# Patient Record
Sex: Female | Born: 1935 | Race: White | Hispanic: No | Marital: Single | Smoking: Never smoker
Health system: Southern US, Community
[De-identification: ages and names within clinical notes are randomized; demographics above are authoritative.]

## PROBLEM LIST (undated history)

## (undated) DIAGNOSIS — G629 Polyneuropathy, unspecified: Secondary | ICD-10-CM

## (undated) DIAGNOSIS — S27322A Contusion of lung, bilateral, initial encounter: Secondary | ICD-10-CM

## (undated) DIAGNOSIS — E119 Type 2 diabetes mellitus without complications: Secondary | ICD-10-CM

## (undated) DIAGNOSIS — M1990 Unspecified osteoarthritis, unspecified site: Secondary | ICD-10-CM

## (undated) DIAGNOSIS — N170 Acute kidney failure with tubular necrosis: Secondary | ICD-10-CM

## (undated) DIAGNOSIS — I2510 Atherosclerotic heart disease of native coronary artery without angina pectoris: Secondary | ICD-10-CM

## (undated) DIAGNOSIS — S271XXD Traumatic hemothorax, subsequent encounter: Secondary | ICD-10-CM

## (undated) DIAGNOSIS — J9621 Acute and chronic respiratory failure with hypoxia: Secondary | ICD-10-CM

## (undated) DIAGNOSIS — K5730 Diverticulosis of large intestine without perforation or abscess without bleeding: Secondary | ICD-10-CM

## (undated) DIAGNOSIS — K529 Noninfective gastroenteritis and colitis, unspecified: Secondary | ICD-10-CM

## (undated) DIAGNOSIS — I10 Essential (primary) hypertension: Secondary | ICD-10-CM

## (undated) HISTORY — PX: AORTIC VALVE REPLACEMENT: SHX41

## (undated) HISTORY — PX: APPENDECTOMY: SHX54

## (undated) HISTORY — PX: CHOLECYSTECTOMY: SHX55

## (undated) HISTORY — PX: FOOT NEUROMA SURGERY: SHX646

## (undated) HISTORY — PX: PACEMAKER INSERTION: SHX728

## (undated) HISTORY — PX: ABDOMINAL HYSTERECTOMY: SHX81

## (undated) HISTORY — PX: KNEE ARTHROSCOPY: SUR90

---

## 2009-11-26 ENCOUNTER — Ambulatory Visit: Payer: Self-pay | Admitting: Thoracic Surgery (Cardiothoracic Vascular Surgery)

## 2010-01-14 ENCOUNTER — Ambulatory Visit: Payer: Self-pay | Admitting: Thoracic Surgery (Cardiothoracic Vascular Surgery)

## 2010-03-05 ENCOUNTER — Ambulatory Visit: Payer: Self-pay | Admitting: Thoracic Surgery (Cardiothoracic Vascular Surgery)

## 2010-11-13 NOTE — Assessment & Plan Note (Signed)
[facility] VISIT   Lyons, Heather  DOB:  09/06/1934                                        January 16, 2010  CHART #:  [id number]   HISTORY:  We saw Ms. Heather Lyons in the office on Wednesday, January 14, 2010 following up for her aortic valve replacement that was carried out  on December 02, 2009.  Ms. Heather is doing quite well following surgery.  Her  sternum is stable.  Her wounds are well healed.  She is feeling much  better with much less shortness of breath.  She has followed up with the  cardiologist and they are happy with her progress.  She is participating  in the [facility].  Sternal wound precautions were discussed  with the patient and she was advised to follow up with [place] again on a  p.r.n. basis.   Tera Mater. Arvilla Market, MD   BC/MEDQ  D:  01/16/2010  T:  01/16/2010  Job:  [id number]

## 2016-04-29 ENCOUNTER — Emergency Department (HOSPITAL_COMMUNITY): Payer: Medicare Other

## 2016-04-29 ENCOUNTER — Encounter (HOSPITAL_COMMUNITY): Payer: Self-pay

## 2016-04-29 ENCOUNTER — Inpatient Hospital Stay (HOSPITAL_COMMUNITY)
Admission: EM | Admit: 2016-04-29 | Discharge: 2016-05-01 | DRG: 083 | Disposition: A | Discharge status: Home / Self Care | Payer: Medicare Other | Attending: Family Medicine | Admitting: Family Medicine

## 2016-04-29 DIAGNOSIS — E860 Dehydration: Secondary | ICD-10-CM

## 2016-04-29 DIAGNOSIS — N182 Chronic kidney disease, stage 2 (mild): Secondary | ICD-10-CM | POA: Diagnosis present

## 2016-04-29 DIAGNOSIS — W1830XA Fall on same level, unspecified, initial encounter: Secondary | ICD-10-CM | POA: Diagnosis present

## 2016-04-29 DIAGNOSIS — N179 Acute kidney failure, unspecified: Secondary | ICD-10-CM

## 2016-04-29 DIAGNOSIS — I440 Atrioventricular block, first degree: Secondary | ICD-10-CM | POA: Diagnosis present

## 2016-04-29 DIAGNOSIS — I4510 Unspecified right bundle-branch block: Secondary | ICD-10-CM

## 2016-04-29 DIAGNOSIS — G450 Vertebro-basilar artery syndrome: Secondary | ICD-10-CM | POA: Diagnosis present

## 2016-04-29 DIAGNOSIS — Z8673 Personal history of transient ischemic attack (TIA), and cerebral infarction without residual deficits: Secondary | ICD-10-CM

## 2016-04-29 DIAGNOSIS — E8770 Fluid overload, unspecified: Secondary | ICD-10-CM | POA: Diagnosis present

## 2016-04-29 DIAGNOSIS — N189 Chronic kidney disease, unspecified: Secondary | ICD-10-CM

## 2016-04-29 DIAGNOSIS — E861 Hypovolemia: Secondary | ICD-10-CM | POA: Diagnosis present

## 2016-04-29 DIAGNOSIS — I10 Essential (primary) hypertension: Secondary | ICD-10-CM | POA: Diagnosis present

## 2016-04-29 DIAGNOSIS — I129 Hypertensive chronic kidney disease with stage 1 through stage 4 chronic kidney disease, or unspecified chronic kidney disease: Secondary | ICD-10-CM | POA: Diagnosis present

## 2016-04-29 DIAGNOSIS — E1122 Type 2 diabetes mellitus with diabetic chronic kidney disease: Secondary | ICD-10-CM | POA: Diagnosis present

## 2016-04-29 DIAGNOSIS — Z90710 Acquired absence of both cervix and uterus: Secondary | ICD-10-CM

## 2016-04-29 DIAGNOSIS — I951 Orthostatic hypotension: Secondary | ICD-10-CM | POA: Diagnosis present

## 2016-04-29 DIAGNOSIS — Y92481 Parking lot as the place of occurrence of the external cause: Secondary | ICD-10-CM

## 2016-04-29 DIAGNOSIS — K7469 Other cirrhosis of liver: Secondary | ICD-10-CM | POA: Diagnosis present

## 2016-04-29 DIAGNOSIS — E1151 Type 2 diabetes mellitus with diabetic peripheral angiopathy without gangrene: Secondary | ICD-10-CM | POA: Diagnosis present

## 2016-04-29 DIAGNOSIS — Z952 Presence of prosthetic heart valve: Secondary | ICD-10-CM

## 2016-04-29 DIAGNOSIS — E785 Hyperlipidemia, unspecified: Secondary | ICD-10-CM | POA: Diagnosis present

## 2016-04-29 DIAGNOSIS — R55 Syncope and collapse: Secondary | ICD-10-CM

## 2016-04-29 DIAGNOSIS — Z888 Allergy status to other drugs, medicaments and biological substances status: Secondary | ICD-10-CM

## 2016-04-29 DIAGNOSIS — R40241 Glasgow coma scale score 13-15, unspecified time: Secondary | ICD-10-CM | POA: Diagnosis present

## 2016-04-29 DIAGNOSIS — S065X9A Traumatic subdural hemorrhage with loss of consciousness of unspecified duration, initial encounter: Principal | ICD-10-CM | POA: Diagnosis present

## 2016-04-29 DIAGNOSIS — E1140 Type 2 diabetes mellitus with diabetic neuropathy, unspecified: Secondary | ICD-10-CM | POA: Diagnosis present

## 2016-04-29 DIAGNOSIS — S0181XA Laceration without foreign body of other part of head, initial encounter: Secondary | ICD-10-CM | POA: Diagnosis present

## 2016-04-29 DIAGNOSIS — S065XAA Traumatic subdural hemorrhage with loss of consciousness status unknown, initial encounter: Secondary | ICD-10-CM | POA: Diagnosis present

## 2016-04-29 DIAGNOSIS — I453 Trifascicular block: Secondary | ICD-10-CM | POA: Diagnosis present

## 2016-04-29 DIAGNOSIS — R001 Bradycardia, unspecified: Secondary | ICD-10-CM

## 2016-04-29 DIAGNOSIS — I517 Cardiomegaly: Secondary | ICD-10-CM | POA: Diagnosis present

## 2016-04-29 DIAGNOSIS — M1990 Unspecified osteoarthritis, unspecified site: Secondary | ICD-10-CM | POA: Diagnosis present

## 2016-04-29 DIAGNOSIS — Z87891 Personal history of nicotine dependence: Secondary | ICD-10-CM

## 2016-04-29 DIAGNOSIS — E1165 Type 2 diabetes mellitus with hyperglycemia: Secondary | ICD-10-CM | POA: Diagnosis present

## 2016-04-29 DIAGNOSIS — K529 Noninfective gastroenteritis and colitis, unspecified: Secondary | ICD-10-CM | POA: Diagnosis present

## 2016-04-29 HISTORY — DX: Noninfective gastroenteritis and colitis, unspecified: K52.9

## 2016-04-29 HISTORY — DX: Polyneuropathy, unspecified: G62.9

## 2016-04-29 HISTORY — DX: Type 2 diabetes mellitus without complications: E11.9

## 2016-04-29 HISTORY — DX: Essential (primary) hypertension: I10

## 2016-04-29 HISTORY — DX: Unspecified osteoarthritis, unspecified site: M19.90

## 2016-04-29 HISTORY — DX: Diverticulosis of large intestine without perforation or abscess without bleeding: K57.30

## 2016-04-29 LAB — I-STAT CHEM 8, ED
BUN: 17 mg/dL (ref 6–20)
CREATININE: 1.1 mg/dL — AB (ref 0.44–1.00)
Calcium, Ion: 1.13 mmol/L — ABNORMAL LOW (ref 1.15–1.40)
Chloride: 99 mmol/L — ABNORMAL LOW (ref 101–111)
GLUCOSE: 158 mg/dL — AB (ref 65–99)
HEMATOCRIT: 36 % (ref 36.0–46.0)
HEMOGLOBIN: 12.2 g/dL (ref 12.0–15.0)
Potassium: 4.5 mmol/L (ref 3.5–5.1)
Sodium: 137 mmol/L (ref 135–145)
TCO2: 26 mmol/L (ref 0–100)

## 2016-04-29 LAB — CBC WITH DIFFERENTIAL/PLATELET
BASOS PCT: 0 %
Basophils Absolute: 0 10*3/uL (ref 0.0–0.1)
EOS PCT: 2 %
Eosinophils Absolute: 0.1 10*3/uL (ref 0.0–0.7)
HEMATOCRIT: 36.7 % (ref 36.0–46.0)
Hemoglobin: 11.6 g/dL — ABNORMAL LOW (ref 12.0–15.0)
Lymphocytes Relative: 10 %
Lymphs Abs: 0.5 10*3/uL — ABNORMAL LOW (ref 0.7–4.0)
MCH: 26.4 pg (ref 26.0–34.0)
MCHC: 31.6 g/dL (ref 30.0–36.0)
MCV: 83.6 fL (ref 78.0–100.0)
MONO ABS: 0.4 10*3/uL (ref 0.1–1.0)
MONOS PCT: 8 %
NEUTROS ABS: 4 10*3/uL (ref 1.7–7.7)
Neutrophils Relative %: 80 %
PLATELETS: 177 10*3/uL (ref 150–400)
RBC: 4.39 MIL/uL (ref 3.87–5.11)
RDW: 14 % (ref 11.5–15.5)
WBC: 5 10*3/uL (ref 4.0–10.5)

## 2016-04-29 LAB — BRAIN NATRIURETIC PEPTIDE: B NATRIURETIC PEPTIDE 5: 84.2 pg/mL (ref 0.0–100.0)

## 2016-04-29 LAB — GLUCOSE, CAPILLARY: Glucose-Capillary: 159 mg/dL — ABNORMAL HIGH (ref 65–99)

## 2016-04-29 LAB — COMPREHENSIVE METABOLIC PANEL
ALK PHOS: 44 U/L (ref 38–126)
ALT: 42 U/L (ref 14–54)
ANION GAP: 8 (ref 5–15)
AST: 63 U/L — ABNORMAL HIGH (ref 15–41)
Albumin: 3.3 g/dL — ABNORMAL LOW (ref 3.5–5.0)
BUN: 15 mg/dL (ref 6–20)
CALCIUM: 9 mg/dL (ref 8.9–10.3)
CO2: 27 mmol/L (ref 22–32)
CREATININE: 1.05 mg/dL — AB (ref 0.44–1.00)
Chloride: 101 mmol/L (ref 101–111)
GFR, EST AFRICAN AMERICAN: 57 mL/min — AB (ref >60)
GFR, EST NON AFRICAN AMERICAN: 49 mL/min — AB (ref >60)
Glucose, Bld: 167 mg/dL — ABNORMAL HIGH (ref 65–99)
Potassium: 4.5 mmol/L (ref 3.5–5.1)
Sodium: 136 mmol/L (ref 135–145)
TOTAL PROTEIN: 6.5 g/dL (ref 6.5–8.1)
Total Bilirubin: 0.6 mg/dL (ref 0.3–1.2)

## 2016-04-29 LAB — I-STAT TROPONIN, ED: Troponin i, poc: 0 ng/mL (ref 0.00–0.08)

## 2016-04-29 LAB — TROPONIN I

## 2016-04-29 MED ORDER — INSULIN ASPART 100 UNIT/ML ~~LOC~~ SOLN: 0.0000 [IU] | Freq: Every day | SUBCUTANEOUS | Status: DC

## 2016-04-29 MED ORDER — DOXEPIN HCL 25 MG PO CAPS: 25.0000 mg | ORAL_CAPSULE | Freq: Two times a day (BID) | ORAL | Status: DC

## 2016-04-29 MED ORDER — GABAPENTIN 400 MG PO CAPS: 1200.0000 mg | ORAL_CAPSULE | Freq: Every day | ORAL | Status: DC

## 2016-04-29 MED ORDER — SODIUM CHLORIDE 0.9 % IV BOLUS (SEPSIS)
1000.0000 mL | Freq: Once | INTRAVENOUS | Status: AC
  Administered 2016-04-29: 1000 mL via INTRAVENOUS

## 2016-04-29 MED ORDER — NAPROXEN SODIUM 220 MG PO TABS: 220.0000 mg | ORAL_TABLET | Freq: Every day | ORAL | Status: DC

## 2016-04-29 MED ORDER — FENOFIBRATE 160 MG PO TABS: 160.0000 mg | ORAL_TABLET | Freq: Every day | ORAL | Status: DC

## 2016-04-29 MED ORDER — INSULIN ASPART 100 UNIT/ML ~~LOC~~ SOLN
0.0000 [IU] | Freq: Three times a day (TID) | SUBCUTANEOUS | Status: DC
  Administered 2016-04-30 – 2016-05-01 (×3): 1 [IU] via SUBCUTANEOUS
  Administered 2016-05-01: 2 [IU] via SUBCUTANEOUS

## 2016-04-29 MED ORDER — AMITRIPTYLINE HCL 25 MG PO TABS: 25.0000 mg | ORAL_TABLET | Freq: Every day | ORAL | Status: DC

## 2016-04-29 MED ORDER — NAPROXEN 250 MG PO TABS
250.0000 mg | ORAL_TABLET | Freq: Every day | ORAL | Status: DC
  Administered 2016-04-30 – 2016-05-01 (×3): 250 mg via ORAL
  Filled 2016-04-29 (×3): qty 1

## 2016-04-29 NOTE — Progress Notes (Signed)
RN spoke to adm MD, Doroteo GlassmanPhelps. Cardiac monitoring and complete order sets will be entered by MD. RN will continue to monitor patient.

## 2016-04-29 NOTE — ED Notes (Signed)
Pt presents following syncopal event today while shopping. Pt. Was bradycardic at 32 BPM on EMS arrival. Pt. AxO x4 in ED, pt. HR 72 NSR at this time. Pt. States felt fine this morning prior to event. Pt. Has small parietal R sided head laceration with bleeding controlled, no sutures required. Pt. On plavix. CT scan shows small 3mm R sided subdural hematoma. Pt. Able to ambulate to bathroom with one person assist.

## 2016-04-29 NOTE — H&P (Signed)
Family Medicine Teaching [facility] Admission History and Physical Service Pager: [phone]  Patient name: Heather Lyons Medical record number: [id number] Date of birth: 06/07/1935 Age: 81 y.o. Gender: female  Primary Care Provider: Vernona Rieger, MD Consultants: None Code Status: Full  Chief Complaint: Loss of consciousness  Assessment and Plan: Heather Lyons is a 81 y.o. female presenting with syncope. PMH is significant for HTN, DM2 with neuorpathy, colitis, diverticulosis, CKD2, anxiety, memory loss, HLD, Vertebrobasilar insufficiency, S/p aortic valve replacement, and arthritis.  #Syncope. Patient experienced loss of consciousness. Most likely secondary to orthostatic hypotension. Orthostatic vital signs positive in ED. Per EMS patient was bradycardic and mildly hypotensive. Other cause of syncopal episode could be vasovagal. History of syncopal episodes after triggers such as vomiting, diarrhea. Cannot rule out cardiovascular cause of syncope. EKG in ED showed RBBB(Known RBBB follows with [place] cards) and LAFB, PVCs.Compared to care everywhere EKGs unchanged. Trop neg. Some bradycardia per EMS but normal rate in ED. Rule out hypoglycemia, hypoxia, anemia as causes of syncope due to normal values. Exertional component to syncope with lack of typical prodrome and resultant trauma, cardiac cause high on differential. S/p aortic valve replacement. From chart review has known vertebrobasilar insufficiency for which she follows with neuro. The some symptoms of vertebrobasilar ischemia are dizziness, vertigo, drop attacks, etc.  -Monitor on telemetry -Obtain echo to rule out structural heart disease -Repeat EKG in a.m. -Not fluid resuscitated in ED with orthostatic hypotension. Will give bolus  -repeat labs in AM  #Subdural Hematoma. From traumatic fall from syncopal episode. Two areas appreciated: thin right parafalcine subdural hematoma measuring 3mm in maximum thickness  anteriorly. Additional tiny right lateral convexity subdural hematoma measuring 2mm in maximum thickness. Will manage nonoperatively at this time since <44mm. Neuro exam intact. Not on anticoagulation. Hemodynamically stable. GCS 15.  -repeat head CT in 7 hrs or sooner if exhibiting changes in exam -hold all antiplatelets and anticoagulation -neuro checks q4hrs -curbside called neurology who agreed with above plan; may need to formally consult in AM depending on repeat CT  #Cardiomegaly and vascular congestion noted on CXR. Patient with symptoms of fluid overload. Euvolemic to hypovolemic on exam. No known history of CHF. BNP was wnl.  Echo in AM monitor  #HTN. Hypotension noted on admission. Diastolic blood pressures soft. Bolus as above Hold off on antihypertensive medications Monitor  CKD stage 2. Cr baseline .8. On admission 1.05. Likely prerenal in setting of dehydration and orthostatic hypotension -recheck Cr in AM -bolus as above -avoid nephrotoxic medications    #T2DM with neuropathy. Last A1c 8.7 11/2015. Only on glipizide at home. Continue Neurontin for neuropathy Will obtain A1C Sensitive SSI monitor CBGs  #HLD. Last lipid panel in 11/2015 showed total cholesterol 131 and LDL 76.  On fenofibrate at home. Will continue Holding aspirin and Plavix in setting of bleed refuses to use statins per chart review  #Cryptogenic cirrhosis. Likely fatty liver nash seeing gi. Elevated AST to 63.  #History of lymphocytic colitis. Recent flare 2 weeks ago.  Continues on doxepin from GI  FEN/GI: heart diet Prophylaxis: SCDs  Disposition: Admit to FPTS. Pending further work-up of syncopal episode.   History of Present Illness:  Heather Lyons is a 81 y.o. female presenting with syncopal episode. PMH significant for HTN, DM2 with neuorpathy, colitis, diverticulosis, and arthritis. Patient states that she was walking from her car [place] [employer] when she got dizzy and passed out. Next thing  she knew she woke up to EMS taking her  blood pressure. EMS found patient bradycardic in 30's with systolic in low 100's low. Patient denies any prodromal symptoms including vision changes, da=horisis, or chest pain. States that for the last 2.5 weeks she had been having non-bloody diarrhea due to a colitis flare. During these episodes she had poor PO intake. That since resolved on Friday of last week. While she was having diarrhea she also felt dizzy but had no passing out spills. Has had previous episodes of syncope with vomiting and diarrhea. States she followed with neurology who told her she had damaged vessels in her neck that were not functioning that caused her spells. Was started on Plavix and states symptoms improved since then.   Complaining of pain only on the back of her head from where she fell.   In ED patient was noted to have SDH on CT head. Orthostatic vitals also positive.   Review Of Systems: Per HPI  Review of Systems  Constitutional: Negative for chills, diaphoresis and fever.  Eyes: Negative for blurred vision.  Respiratory: Negative for shortness of breath.   Cardiovascular: Negative for chest pain.  Gastrointestinal: Negative for abdominal pain, diarrhea, nausea and vomiting.  Neurological: Positive for dizziness and loss of consciousness. Negative for focal weakness and headaches.    Patient Active Problem List   Diagnosis Date Noted  . SDH (subdural hematoma) (HCC) 04/29/2016    Past Medical History: Past Medical History:  Diagnosis Date  . Arthritis   . Colitis   . Diabetes mellitus without complication (HCC)   . Diverticula of colon   . Hypertension   . Neuropathy [facility](HCC)     Past Surgical History: Past Surgical History:  Procedure Laterality Date  . ABDOMINAL HYSTERECTOMY    . AORTIC VALVE REPLACEMENT    . APPENDECTOMY    . CHOLECYSTECTOMY    . FOOT NEUROMA SURGERY    . KNEE ARTHROSCOPY      Social History: Social History  Substance Use Topics   . Smoking status: Former Games developermoker  . Smokeless tobacco: Never Used  . Alcohol use No   Additional social history: lives alone Please also refer to relevant sections of EMR.  Family History: Family History  Problem Relation Age of Onset  . Colon cancer Mother   . Hodgkin's lymphoma Mother   . Heart disease Father   . AAA (abdominal aortic aneurysm) Father   . Aneurysm Father   . Heart disease Sister   . Colon cancer Sister      Allergies and Medications: Allergies  Allergen Reactions  . Cold Medicine Plus [Chlorphen-Pseudoephed-Apap]     Comtrex   No current facility-administered medications on file prior to encounter.    No current outpatient prescriptions on file prior to encounter.    Objective: BP 129/66   Pulse 64   Resp 18   Ht 5\' 8"  (1.727 m)   Wt 225 lb (102.1 kg)   SpO2 98%   BMI 34.21 kg/m  Exam: General: alert, well-developed, NAD, cooperative, obese HEENT: [place], traumatic laceration of right side of occiput. Vision grossly intact, PERRLA, no injection and anicteric. EOMI. Tachy mucous membranes, oral mucosa and oropharynx reveal no lesions or exudates. Dentures in place.  Ears: External ear exam reveals no significant lesions or deformities.  Nose: External nasal exam reveals no deformity or inflammation. Nasal mucosa are pink and moist. No lesions or exudates noted.  Neck: supple, full ROM, no thyromegaly. No deformities, masses, or tenderness noted.  Lungs: CTAB, normal respiratory effort, no crackles,  and no wheezes.  Heart: RRR, murmur appreciated Abdomen: Bowel sounds normal; abdomen soft and nontender. No distension. no guarding, no rebound tenderness Msk: no joint swelling, no joint warmth, and no redness over joints. Normal tone Pulses: DP/PT are full and equal bilaterally.  Extremities: No cyanosis, clubbing, trace edema Neurologic: No focal deficits, CN grossly intact,+5 strength globally, sensation diminished in bilateral lower extremities  otherwise normal, A&Ox3.  Skin: Abrasions noted on bilateral elbows. Bruising on arms and left lower abdomen.  Psych: Mood and affect are normal; no evidence of anxiety or depression.   Labs and Imaging: Results for orders placed or performed during the hospital encounter of 04/29/16 (from the past 24 hour(s))  CBC with Differential     Status: Abnormal   Collection Time: 04/29/16  5:03 PM  Result Value Ref Range   WBC 5.0 4.0 - 10.5 K/uL   RBC 4.39 3.87 - 5.11 MIL/uL   Hemoglobin 11.6 (L) 12.0 - 15.0 g/dL   HCT 16.136.7 09.636.0 - 04.546.0 %   MCV 83.6 78.0 - 100.0 fL   MCH 26.4 26.0 - 34.0 pg   MCHC 31.6 30.0 - 36.0 g/dL   RDW 40.914.0 81.111.5 - 91.415.5 %   Platelets 177 150 - 400 K/uL   Neutrophils Relative % 80 %   Neutro Abs 4.0 1.7 - 7.7 K/uL   Lymphocytes Relative 10 %   Lymphs Abs 0.5 (L) 0.7 - 4.0 K/uL   Monocytes Relative 8 %   Monocytes Absolute 0.4 0.1 - 1.0 K/uL   Eosinophils Relative 2 %   Eosinophils Absolute 0.1 0.0 - 0.7 K/uL   Basophils Relative 0 %   Basophils Absolute 0.0 0.0 - 0.1 K/uL  Comprehensive metabolic panel     Status: Abnormal   Collection Time: 04/29/16  5:03 PM  Result Value Ref Range   Sodium 136 135 - 145 mmol/L   Potassium 4.5 3.5 - 5.1 mmol/L   Chloride 101 101 - 111 mmol/L   CO2 27 22 - 32 mmol/L   Glucose, Bld 167 (H) 65 - 99 mg/dL   BUN 15 6 - 20 mg/dL   Creatinine, Ser 7.821.05 (H) 0.44 - 1.00 mg/dL   Calcium 9.0 8.9 - 95.610.3 mg/dL   Total Protein 6.5 6.5 - 8.1 g/dL   Albumin 3.3 (L) 3.5 - 5.0 g/dL   AST 63 (H) 15 - 41 U/L   ALT 42 14 - 54 U/L   Alkaline Phosphatase 44 38 - 126 U/L   Total Bilirubin 0.6 0.3 - 1.2 mg/dL   GFR calc non Af Amer 49 (L) >60 mL/min   GFR calc Af Amer 57 (L) >60 mL/min   Anion gap 8 5 - 15  I-stat troponin, ED     Status: None   Collection Time: 04/29/16  5:04 PM  Result Value Ref Range   Troponin i, poc 0.00 0.00 - 0.08 ng/mL   Comment 3          I-stat Chem 8, ED     Status: Abnormal   Collection Time: 04/29/16  5:06 PM   Result Value Ref Range   Sodium 137 135 - 145 mmol/L   Potassium 4.5 3.5 - 5.1 mmol/L   Chloride 99 (L) 101 - 111 mmol/L   BUN 17 6 - 20 mg/dL   Creatinine, Ser 2.131.10 (H) 0.44 - 1.00 mg/dL   Glucose, Bld 086158 (H) 65 - 99 mg/dL   Calcium, Ion 5.781.13 (L) 1.15 - 1.40 mmol/L  TCO2 26 0 - 100 mmol/L   Hemoglobin 12.2 12.0 - 15.0 g/dL   HCT 16.1 09.6 - 04.5 %   Dg Chest 2 View  Result Date: 04/29/2016 CLINICAL DATA:  Dizziness EXAM: CHEST  2 VIEW COMPARISON:  None. FINDINGS: The heart is moderately enlarged. Postop changes from sternotomy and aortic valve replacement are noted. Vascular congestion. Lungs are under aerated. No pneumothorax or pleural effusion. No Kerley B lines to suggest interstitial edema. Central pulmonary vessels are prominent likely due to volume overload. IMPRESSION: Cardiomegaly and vascular congestion compatible with mild volume overload. Electronically Signed   By: Jolaine Click M.D.   On: 04/29/2016 16:29   Ct Head Wo Contrast  Result Date: 04/29/2016 CLINICAL DATA:  Fall, Plavix after syncope EXAM: CT HEAD WITHOUT CONTRAST TECHNIQUE: Contiguous axial images were obtained from the base of the skull through the vertex without intravenous contrast. COMPARISON:  None. FINDINGS: Brain: There is no acute territorial infarction identified. There is a thin right parafalcine subdural hematoma, this measures 3 mm in maximum thickness anteriorly. There is asymmetric thickening and increased density along the right tentorium, also suspected to represent small subdural hematoma. Additional tiny right lateral convexity subdural hematoma measuring 2 mm in maximum thickness, series 203, image number 21. No midline shift. No focal masses. Mild atrophy. Mild periventricular white matter hypodensity consistent with small vessel disease. Old appearing lacunar infarct in the right basal ganglia. Vascular: No hyperdense vessels. There are dense calcifications within the carotid arteries. Skull: The  mastoid air cells are clear. There is no depressed skull fracture. Sinuses/Orbits: Mild mucosal thickening in the ethmoid sinuses. No acute orbital abnormality. Other: Large right parietal scalp hematoma. IMPRESSION: 1. Thin right parafalcine subdural hematoma measuring 3 mm in maximum thickness. No midline shift. Additional right lateral convexity 2 mm subdural hematoma. Small amount of right subdural blood along the tentorium. 2. Atrophy and mild periventricular white matter small vessel disease. 3. Large right parietal scalp hematoma without underlying skull fracture. Critical Value/emergent results were called by telephone at the time of interpretation on 04/29/2016 at 4:55 pm to Dr. Jeri Modena GADDY , who verbally acknowledged these results. Electronically Signed   By: Jasmine Pang M.D.   On: 04/29/2016 16:55    Pincus Large, DO 04/29/2016, 6:18 PM PGY-3, [facility] FPTS Intern pager: [phone], text pages welcome

## 2016-04-29 NOTE — Progress Notes (Signed)
Med rec inadequate and unable to reach patient's pharmacy. Will hold all home medications tonight until we can get a better understanding of her medications. None seem emergent in nature and patient already took some of her medications this morning. Spoke with pharmacy and nursing about this and they are agreeable. Of note, patient showed me a paper from prior office visit from an AVS that she kept in her wallet of her medications. Unsure if pharmacy was aware of this when she was med rec'd.   Caryl AdaJazma Almyra Birman, DO 04/29/2016, 10:14 PM PGY-3, [facility]

## 2016-04-29 NOTE — ED Triage Notes (Signed)
Pt BIB [facility] for evaluation of syncopal episode today. Pt. Was ambulating [employer] this afternoon when she felt dizzy and had a syncopal episode. Pt. Is on plavix, has hematoma to posterior head. EMS notes pt bradycardic 32 BPM on arrival. Pt states has had intermittent nausea/diarrhea x 2 weeks, states felt fine this AM. Pt. AxO x4 on assessment, HR 76.

## 2016-04-29 NOTE — Progress Notes (Signed)
NT did not place cardiac monitor on patient as delegated by RN. RN place patient on cardiac monitor and contacted CCMD at 2100. RN requested NT to verify cardiac monitor with CCMD. RN will confirm verification.

## 2016-04-29 NOTE — Progress Notes (Signed)
RN spoke with in patient pharmacist, who states to hold medications until current medication list can be confirmed by patients pharmacy ([facility]). RN notified MD and will continue to monitor patient.

## 2016-04-29 NOTE — ED Notes (Signed)
Pt tolerated ambulation to restroom with this RN well. Pt. Denies dizziness.

## 2016-04-29 NOTE — ED Provider Notes (Signed)
[facility]-EMERGENCY DEPT Provider Note   CSN: [id number] Arrival date & time: 04/29/16  1531  History   Chief Complaint Chief Complaint  Patient presents with  . Loss of Consciousness   HPI Heather Evenerdith Jeansonne is a 81 y.o. female.   Loss of Consciousness   This is a new problem. The current episode started less than 1 hour ago. The problem occurs constantly. The problem has not changed since onset.She lost consciousness for a period of less than one minute. The problem is associated with exertion and sitting up. Associated symptoms include dizziness and light-headedness. Pertinent negatives include abdominal pain, bowel incontinence, chest pain, confusion, fever, focal weakness, nausea, palpitations, seizures, slurred speech, visual change and vomiting. Her past medical history is significant for DM. Her past medical history does not include CAD.    Past Medical History:  Diagnosis Date  . Arthritis   . Diabetes mellitus without complication (HCC)   . Hypertension   . Neuropathy (HCC)    There are no active problems to display for this patient.  Past Surgical History:  Procedure Laterality Date  . ABDOMINAL HYSTERECTOMY    . APPENDECTOMY    . CHOLECYSTECTOMY      OB History    No data available     Home Medications    Prior to Admission medications   Not on File    Family History No family history on file.  Social History Social History  Substance Use Topics  . Smoking status: Former Games developermoker  . Smokeless tobacco: Never Used  . Alcohol use No     Allergies   Cold medicine plus [chlorphen-pseudoephed-apap]   Review of Systems Review of Systems  Constitutional: Negative for fever.  Cardiovascular: Positive for syncope. Negative for chest pain and palpitations.  Gastrointestinal: Negative for abdominal pain, bowel incontinence, nausea and vomiting.  Neurological: Positive for dizziness and light-headedness. Negative for focal weakness and seizures.    Psychiatric/Behavioral: Negative for confusion.  All other systems reviewed and are negative.  Physical Exam Updated Vital Signs BP 124/63   Pulse 74   Resp 17   Ht 5\' 8"  (1.727 m)   Wt 102.1 kg   SpO2 96%   BMI 34.21 kg/m   Physical Exam  Constitutional: She appears well-developed and well-nourished. No distress.  HENT:  Head: Normocephalic.  Mouth/Throat: Mucous membranes are dry.  Occipital abrasion hemostatic  Eyes: EOM are normal. Pupils are equal, round, and reactive to light.  Neck: Neck supple. No JVD present.  Cardiovascular: Normal rate and intact distal pulses.   Murmur heard. Pulmonary/Chest: Effort normal. She has rales.  Trace bilateral rales in bases  Abdominal: Soft. She exhibits no distension. There is no tenderness. There is no guarding.  Musculoskeletal: Normal range of motion. She exhibits no edema.  Neurological: She is alert.  Skin: She is not diaphoretic.  Nursing note and vitals reviewed.  ED Treatments / Results  Labs (all labs ordered are listed, but only abnormal results are displayed) Labs Reviewed  CBC WITH DIFFERENTIAL/PLATELET  COMPREHENSIVE METABOLIC PANEL  I-STAT CHEM 8, ED  I-STAT TROPOININ, ED   EKG  EKG Interpretation None      Radiology Dg Chest 2 View  Result Date: 04/29/2016 CLINICAL DATA:  Dizziness EXAM: CHEST  2 VIEW COMPARISON:  None. FINDINGS: The heart is moderately enlarged. Postop changes from sternotomy and aortic valve replacement are noted. Vascular congestion. Lungs are under aerated. No pneumothorax or pleural effusion. No Kerley B lines to suggest interstitial edema. Central pulmonary  vessels are prominent likely due to volume overload. IMPRESSION: Cardiomegaly and vascular congestion compatible with mild volume overload. Electronically Signed   By: Jolaine ClickArthur  Hoss M.D.   On: 04/29/2016 16:29   Ct Head Wo Contrast  Result Date: 04/29/2016 CLINICAL DATA:  Fall, Plavix after syncope EXAM: CT HEAD WITHOUT CONTRAST  TECHNIQUE: Contiguous axial images were obtained from the base of the skull through the vertex without intravenous contrast. COMPARISON:  None. FINDINGS: Brain: There is no acute territorial infarction identified. There is a thin right parafalcine subdural hematoma, this measures 3 mm in maximum thickness anteriorly. There is asymmetric thickening and increased density along the right tentorium, also suspected to represent small subdural hematoma. Additional tiny right lateral convexity subdural hematoma measuring 2 mm in maximum thickness, series 203, image number 21. No midline shift. No focal masses. Mild atrophy. Mild periventricular white matter hypodensity consistent with small vessel disease. Old appearing lacunar infarct in the right basal ganglia. Vascular: No hyperdense vessels. There are dense calcifications within the carotid arteries. Skull: The mastoid air cells are clear. There is no depressed skull fracture. Sinuses/Orbits: Mild mucosal thickening in the ethmoid sinuses. No acute orbital abnormality. Other: Large right parietal scalp hematoma. IMPRESSION: 1. Thin right parafalcine subdural hematoma measuring 3 mm in maximum thickness. No midline shift. Additional right lateral convexity 2 mm subdural hematoma. Small amount of right subdural blood along the tentorium. 2. Atrophy and mild periventricular white matter small vessel disease. 3. Large right parietal scalp hematoma without underlying skull fracture. Critical Value/emergent results were called by telephone at the time of interpretation on 04/29/2016 at 4:55 pm to Dr. Jeri ModenaJEREMIAH Amberia Bayless , who verbally acknowledged these results. Electronically Signed   By: Jasmine PangKim  Fujinaga M.D.   On: 04/29/2016 16:55    Procedures Procedures (including critical care time)  Medications Ordered in ED Medications  naproxen (NAPROSYN) tablet 250 mg (250 mg Oral Given 04/30/16 0043)  insulin aspart (novoLOG) injection 0-9 Units (not administered)  insulin aspart  (novoLOG) injection 0-5 Units (0 Units Subcutaneous Not Given 04/29/16 2259)  gabapentin (NEURONTIN) capsule 400 mg (400 mg Oral Given 04/30/16 0043)  sodium chloride 0.9 % bolus 1,000 mL (1,000 mLs Intravenous Given 04/29/16 2250)     Initial Impression / Assessment and Plan / ED Course  I have reviewed the triage vital signs and the nursing notes.  Pertinent labs & imaging results that were available during my care of the patient were reviewed by me and considered in my medical decision making (see chart for details).  Clinical Course   Patient presents to the ED with syncopal episode after [facility] [employer]  EMS found patient bradycardic in 30's with systolic in low 100's. Here patient normotensive and normal rate. Well appearing.    History of nausea, diarrhea that occurred for 7 days straight and patient endorses previous light headedness/dizziness episodes since prior.  Denies chest pain. Endorses shortness of breath with exertion but that "has always been there." Patient states that she has had multiple workups for her SOB with exertion and "everything has checked out."   Denies any black stools. Denies any diaphoresis, palpitations prior. Has history of syncopal episodes in the past with vomiting.   Denies headache, doubt SAH.   More than likely vasovagal given dehydrated appearance, decreased PO intake and diarrhea.   Currently on plavix. Will obtain labs and CT.   CT shows small SDH. Neurovascuarly intact. Patient's vitals/GCS appropriate for floor.   Patient admitted to hospitalist for  further management.    Final Clinical Impressions(s) / ED Diagnoses   Final diagnoses:  Syncope and collapse  Dehydration  SDH (subdural hematoma) (HCC)     Deirdre Peer, MD 04/30/16 0127    Geoffery Lyons, MD 04/30/16 2353

## 2016-04-30 ENCOUNTER — Observation Stay (HOSPITAL_COMMUNITY): Payer: Medicare Other

## 2016-04-30 DIAGNOSIS — E861 Hypovolemia: Secondary | ICD-10-CM | POA: Diagnosis present

## 2016-04-30 DIAGNOSIS — R55 Syncope and collapse: Secondary | ICD-10-CM | POA: Diagnosis present

## 2016-04-30 DIAGNOSIS — E1165 Type 2 diabetes mellitus with hyperglycemia: Secondary | ICD-10-CM | POA: Diagnosis present

## 2016-04-30 DIAGNOSIS — R40241 Glasgow coma scale score 13-15, unspecified time: Secondary | ICD-10-CM | POA: Diagnosis present

## 2016-04-30 DIAGNOSIS — E1151 Type 2 diabetes mellitus with diabetic peripheral angiopathy without gangrene: Secondary | ICD-10-CM | POA: Diagnosis present

## 2016-04-30 DIAGNOSIS — E1140 Type 2 diabetes mellitus with diabetic neuropathy, unspecified: Secondary | ICD-10-CM | POA: Diagnosis present

## 2016-04-30 DIAGNOSIS — I440 Atrioventricular block, first degree: Secondary | ICD-10-CM | POA: Diagnosis present

## 2016-04-30 DIAGNOSIS — E860 Dehydration: Secondary | ICD-10-CM | POA: Diagnosis present

## 2016-04-30 DIAGNOSIS — W1830XA Fall on same level, unspecified, initial encounter: Secondary | ICD-10-CM | POA: Diagnosis present

## 2016-04-30 DIAGNOSIS — S0181XA Laceration without foreign body of other part of head, initial encounter: Secondary | ICD-10-CM | POA: Diagnosis present

## 2016-04-30 DIAGNOSIS — E1122 Type 2 diabetes mellitus with diabetic chronic kidney disease: Secondary | ICD-10-CM | POA: Diagnosis present

## 2016-04-30 DIAGNOSIS — I6200 Nontraumatic subdural hemorrhage, unspecified: Secondary | ICD-10-CM

## 2016-04-30 DIAGNOSIS — N189 Chronic kidney disease, unspecified: Secondary | ICD-10-CM

## 2016-04-30 DIAGNOSIS — I509 Heart failure, unspecified: Secondary | ICD-10-CM | POA: Diagnosis not present

## 2016-04-30 DIAGNOSIS — I4510 Unspecified right bundle-branch block: Secondary | ICD-10-CM | POA: Diagnosis present

## 2016-04-30 DIAGNOSIS — R001 Bradycardia, unspecified: Secondary | ICD-10-CM | POA: Diagnosis not present

## 2016-04-30 DIAGNOSIS — Y92481 Parking lot as the place of occurrence of the external cause: Secondary | ICD-10-CM | POA: Diagnosis not present

## 2016-04-30 DIAGNOSIS — E8770 Fluid overload, unspecified: Secondary | ICD-10-CM | POA: Diagnosis present

## 2016-04-30 DIAGNOSIS — I129 Hypertensive chronic kidney disease with stage 1 through stage 4 chronic kidney disease, or unspecified chronic kidney disease: Secondary | ICD-10-CM | POA: Diagnosis present

## 2016-04-30 DIAGNOSIS — Z888 Allergy status to other drugs, medicaments and biological substances status: Secondary | ICD-10-CM | POA: Diagnosis not present

## 2016-04-30 DIAGNOSIS — I453 Trifascicular block: Secondary | ICD-10-CM | POA: Diagnosis present

## 2016-04-30 DIAGNOSIS — N179 Acute kidney failure, unspecified: Secondary | ICD-10-CM

## 2016-04-30 DIAGNOSIS — I10 Essential (primary) hypertension: Secondary | ICD-10-CM | POA: Diagnosis present

## 2016-04-30 DIAGNOSIS — E785 Hyperlipidemia, unspecified: Secondary | ICD-10-CM | POA: Diagnosis present

## 2016-04-30 DIAGNOSIS — K7469 Other cirrhosis of liver: Secondary | ICD-10-CM | POA: Diagnosis present

## 2016-04-30 DIAGNOSIS — Z90710 Acquired absence of both cervix and uterus: Secondary | ICD-10-CM | POA: Diagnosis not present

## 2016-04-30 DIAGNOSIS — S065X9A Traumatic subdural hemorrhage with loss of consciousness of unspecified duration, initial encounter: Secondary | ICD-10-CM | POA: Diagnosis present

## 2016-04-30 DIAGNOSIS — Z87891 Personal history of nicotine dependence: Secondary | ICD-10-CM | POA: Diagnosis not present

## 2016-04-30 DIAGNOSIS — G450 Vertebro-basilar artery syndrome: Secondary | ICD-10-CM | POA: Diagnosis present

## 2016-04-30 DIAGNOSIS — K529 Noninfective gastroenteritis and colitis, unspecified: Secondary | ICD-10-CM | POA: Diagnosis present

## 2016-04-30 DIAGNOSIS — Z952 Presence of prosthetic heart valve: Secondary | ICD-10-CM | POA: Diagnosis not present

## 2016-04-30 DIAGNOSIS — N182 Chronic kidney disease, stage 2 (mild): Secondary | ICD-10-CM | POA: Diagnosis present

## 2016-04-30 LAB — CBC
HEMATOCRIT: 36.5 % (ref 36.0–46.0)
HEMOGLOBIN: 11.4 g/dL — AB (ref 12.0–15.0)
MCH: 26.1 pg (ref 26.0–34.0)
MCHC: 31.2 g/dL (ref 30.0–36.0)
MCV: 83.5 fL (ref 78.0–100.0)
Platelets: 179 10*3/uL (ref 150–400)
RBC: 4.37 MIL/uL (ref 3.87–5.11)
RDW: 14 % (ref 11.5–15.5)
WBC: 4.7 10*3/uL (ref 4.0–10.5)

## 2016-04-30 LAB — BASIC METABOLIC PANEL
ANION GAP: 6 (ref 5–15)
BUN: 13 mg/dL (ref 6–20)
CO2: 28 mmol/L (ref 22–32)
Calcium: 8.7 mg/dL — ABNORMAL LOW (ref 8.9–10.3)
Chloride: 105 mmol/L (ref 101–111)
Creatinine, Ser: 0.88 mg/dL (ref 0.44–1.00)
GLUCOSE: 145 mg/dL — AB (ref 65–99)
POTASSIUM: 4 mmol/L (ref 3.5–5.1)
Sodium: 139 mmol/L (ref 135–145)

## 2016-04-30 LAB — GLUCOSE, CAPILLARY
GLUCOSE-CAPILLARY: 110 mg/dL — AB (ref 65–99)
GLUCOSE-CAPILLARY: 180 mg/dL — AB (ref 65–99)
Glucose-Capillary: 147 mg/dL — ABNORMAL HIGH (ref 65–99)
Glucose-Capillary: 145 mg/dL — ABNORMAL HIGH (ref 65–99)

## 2016-04-30 MED ORDER — GABAPENTIN 400 MG PO CAPS
400.0000 mg | ORAL_CAPSULE | Freq: Every day | ORAL | Status: DC
  Administered 2016-04-30 (×2): 400 mg via ORAL
  Filled 2016-04-30 (×2): qty 1

## 2016-04-30 NOTE — Consult Note (Addendum)
CARDIOLOGY CONSULT NOTE   Patient ID: Heather Lyons MRN: [id number] DOB/AGE: 79/06/1934 79 y.o.  Admit date: 04/29/2016  Primary Physician   Vernona Rieger, MD Primary Cardiologist   Dr. Dot Been  @ [facility] Reason for Consultation  Syncope Requesting Physician  Dr. Jennette Kettle  HPI: Heather Lyons is a 81 y.o. female with a history of Aortic valve replacement, hypertension, hyperlipidemia, diabetes with neuropathy, chronic dyspnea on exertion, colitis, cirrhosis and CKD stage II who presented with syncope.  The patient states that she has a chronic dyspnea on exertion. This has been stable. She was in usual state of health up until yesterday afternoon as she had a syncope episode. He drove to [employer] for [employer]. She has walking about 50 feet with lightheadedness and dizziness then had a sudden episode of syncope. Recent fall backward. She was noted bradycardic with minimal hypotensive by EMS. CT of the head showed subdural hematoma 2. EKG shows sinus rhythm at a rate of 71 bpm and chronic right bundle branch block and PVC. BNP normal troponin negative. Electrolytes normal. Chest x-ray shows cardiomegaly and mild vascular congestion.Telemetry shows sinus rhythm with a rate mostly in 70s with PACs and atrial bigeminy.  She was noticed orthostatic upon presentation that has been resolved. Her amlodipine, ASA and plavix held.   Orthostatic VS for the past 24 hrs:  BP- Lying Pulse- Lying BP- Sitting Pulse- Sitting BP- Standing at 0 minutes Pulse- Standing at 0 minutes  04/30/16 1240 (!) 134/94 55 169/76 52 158/55 (!) 48  04/29/16 1725 141/69 64 129/66 67 115/53 68   Patient denies any chest pain, palpitation, orthopnea, PND, lower extremity edema, abdominal pain. History of diarrhea approximately 2 weeks ago that has been resolved for the past one week.   Past Medical History:  Diagnosis Date  . Arthritis   . Colitis   . Diabetes mellitus without complication (HCC)   . Diverticula of colon    . Hypertension   . Neuropathy [facility])      Past Surgical History:  Procedure Laterality Date  . ABDOMINAL HYSTERECTOMY    . AORTIC VALVE REPLACEMENT    . APPENDECTOMY    . CHOLECYSTECTOMY    . FOOT NEUROMA SURGERY    . KNEE ARTHROSCOPY      Allergies  Allergen Reactions  . Adrenalin Hives and Other (See Comments)    syncope  . Ace Inhibitors     unknown  . Cold Medicine Plus [Chlorphen-Pseudoephed-Apap] Hives and Other (See Comments)    Comtrex; "passes out"    I have reviewed the patient's current medications . gabapentin  400 mg Oral QHS  . insulin aspart  0-5 Units Subcutaneous QHS  . insulin aspart  0-9 Units Subcutaneous TID WC  . naproxen  250 mg Oral Daily       Prior to Admission medications   Medication Sig Start Date End Date Taking? Authorizing Provider  amitriptyline (ELAVIL) 25 MG tablet Take 25 mg by mouth at bedtime. Patient states she does take this medication, however she takes it two hours before taking the doxepin and Gabapentin at night   Yes Historical Provider, MD  amLODipine (NORVASC) 10 MG tablet Take 10 mg by mouth daily.   Yes Historical Provider, MD  Ascorbic Acid (VITAMIN C) 1000 MG tablet Take 1,000 mg by mouth daily.   Yes Historical Provider, MD  aspirin 81 MG chewable tablet Chew 81 mg by mouth every other day.    Yes Historical Provider, MD  cetirizine (ZYRTEC)  10 MG tablet Take 10 mg by mouth daily.   Yes Historical Provider, MD  clopidogrel (PLAVIX) 75 MG tablet Take 75 mg by mouth daily.   Yes Historical Provider, MD  doxepin (SINEQUAN) 25 MG capsule Take 25 mg by mouth at bedtime. Patient states she takes one at bedtime to relax the pain in her feet, she states she takes along with the gabapentin every night   Yes Historical Provider, MD  fenofibrate 160 MG tablet Take 160 mg by mouth daily.   Yes Historical Provider, MD  Flaxseed, Linseed, (FLAXSEED OIL) 1000 MG CAPS Take 1 capsule by mouth daily.   Yes Historical Provider, MD    gabapentin (NEURONTIN) 100 MG capsule Take 100 mg by mouth at bedtime.   Yes Historical Provider, MD  glipiZIDE (GLUCOTROL) 5 MG tablet Take 20 mg by mouth daily before breakfast.   Yes Historical Provider, MD  naproxen sodium (ANAPROX) 220 MG tablet Take 220 mg by mouth daily.   Yes Historical Provider, MD  omega-3 acid ethyl esters (LOVAZA) 1 g capsule Take 1 g by mouth daily.    Yes Historical Provider, MD  Red Yeast Rice 600 MG CAPS Take 1 capsule by mouth daily.   Yes Historical Provider, MD  gabapentin (NEURONTIN) 300 MG capsule Take 1,200 mg by mouth at bedtime.     Historical Provider, MD     Social History   Social History  . Marital status: Single    Spouse name: N/A  . Number of children: N/A  . Years of education: N/A   Occupational History  . Not on file.   Social History Main Topics  . Smoking status: Former Games developermoker  . Smokeless tobacco: Never Used  . Alcohol use No  . Drug use: No  . Sexual activity: Not on file   Other Topics Concern  . Not on file   Social History Narrative  . No narrative on file    Family Status  Relation Status  . Mother   . Father   . Sister    Family History  Problem Relation Age of Onset  . Colon cancer Mother   . Hodgkin's lymphoma Mother   . Heart disease Father   . AAA (abdominal aortic aneurysm) Father   . Aneurysm Father   . Heart disease Sister   . Colon cancer Sister       ROS:  Full 14 point review of systems complete and found to be negative unless listed above.  Physical Exam: Blood pressure (!) 157/63, pulse 72, temperature 98 F (36.7 C), temperature source Oral, resp. rate 18, height 5\' 8"  (1.727 m), weight 229 lb 14.4 oz (104.3 kg), SpO2 98 %.  General: Well developed, well nourished, morbidly obese female in no acute distress Head: Eyes PERRLA, No xanthomas. Normocephalic and atraumatic, oropharynx without edema or exudate.  Lungs: Resp regular and unlabored, CTA. Heart: RRR no s3, s4, or murmurs..    Neck: No carotid bruits. No lymphadenopathy. No  JVD. Abdomen: Bowel sounds present, abdomen soft and non-tender without masses or hernias noted. Msk:  No spine or cva tenderness. No weakness, no joint deformities or effusions. Extremities: No clubbing, cyanosis or edema. DP/PT/Radials 2+ and equal bilaterally. Neuro: Alert and oriented X 3. No focal deficits noted. Psych:  Good affect, responds appropriately Skin: No rashes or lesions noted.  Labs:   Lab Results  Component Value Date   WBC 4.7 04/30/2016   HGB 11.4 (L) 04/30/2016   HCT 36.5 04/30/2016  MCV 83.5 04/30/2016   PLT 179 04/30/2016   No results for input(s): INR in the last 72 hours.  Recent Labs Lab 04/29/16 1703  04/30/16 0216  NA 136  < > 139  K 4.5  < > 4.0  CL 101  < > 105  CO2 27  --  28  BUN 15  < > 13  CREATININE 1.05*  < > 0.88  CALCIUM 9.0  --  8.7*  PROT 6.5  --   --   BILITOT 0.6  --   --   ALKPHOS 44  --   --   ALT 42  --   --   AST 63*  --   --   GLUCOSE 167*  < > 145*  ALBUMIN 3.3*  --   --   < > = values in this interval not displayed. No results found for: MG  Recent Labs  04/29/16 2240  TROPONINI <0.03    Recent Labs  04/29/16 1704  TROPIPOC 0.00   No results found for: PROBNP No results found for: CHOL, HDL, LDLCALC, TRIG No results found for: DDIMER No results found for: LIPASE, AMYLASE No results found for: TSH, T4TOTAL, T3FREE, THYROIDAB No results found for: VITAMINB12, FOLATE, FERRITIN, TIBC, IRON, RETICCTPCT  Echo: Pending  Radiology:  Dg Chest 2 View  Result Date: 04/29/2016 CLINICAL DATA:  Dizziness EXAM: CHEST  2 VIEW COMPARISON:  None. FINDINGS: The heart is moderately enlarged. Postop changes from sternotomy and aortic valve replacement are noted. Vascular congestion. Lungs are under aerated. No pneumothorax or pleural effusion. No Kerley B lines to suggest interstitial edema. Central pulmonary vessels are prominent likely due to volume overload. IMPRESSION:  Cardiomegaly and vascular congestion compatible with mild volume overload. Electronically Signed   By: Jolaine Click M.D.   On: 04/29/2016 16:29   Ct Head Wo Contrast  Result Date: 04/30/2016 CLINICAL DATA:  81 y/o  F; subdural hematoma follow-up. EXAM: CT HEAD WITHOUT CONTRAST TECHNIQUE: Contiguous axial images were obtained from the base of the skull through the vertex without intravenous contrast. COMPARISON:  04/29/2016 CT head. FINDINGS: Brain: Stable small subdural along the right falx, right tentorium, and overlying right lateral frontal lobe. No evidence for new intracranial hemorrhage, significant mass effect, or large territory infarct. Stable background of mild chronic microvascular ischemic changes and parenchymal volume loss. No high-dose Vascular: No hyperdense vessel. Calcific atherosclerosis of internal carotid arteries. Skull: No displaced calvarial fracture. Sinuses/Orbits: No acute finding. Other: Stable right parietal scalp hematoma and contusion. IMPRESSION: Stable small right falcine, tentorium, and right lateral frontal convexity subdural hematoma. Stable right parietal scalp hematoma and contusion. No new intracranial hemorrhage identified. No significant mass effect. Electronically Signed   By: Mitzi Hansen M.D.   On: 04/30/2016 03:24   Ct Head Wo Contrast  Result Date: 04/29/2016 CLINICAL DATA:  Fall, Plavix after syncope EXAM: CT HEAD WITHOUT CONTRAST TECHNIQUE: Contiguous axial images were obtained from the base of the skull through the vertex without intravenous contrast. COMPARISON:  None. FINDINGS: Brain: There is no acute territorial infarction identified. There is a thin right parafalcine subdural hematoma, this measures 3 mm in maximum thickness anteriorly. There is asymmetric thickening and increased density along the right tentorium, also suspected to represent small subdural hematoma. Additional tiny right lateral convexity subdural hematoma measuring 2 mm in  maximum thickness, series 203, image number 21. No midline shift. No focal masses. Mild atrophy. Mild periventricular white matter hypodensity consistent with small vessel disease. Old  appearing lacunar infarct in the right basal ganglia. Vascular: No hyperdense vessels. There are dense calcifications within the carotid arteries. Skull: The mastoid air cells are clear. There is no depressed skull fracture. Sinuses/Orbits: Mild mucosal thickening in the ethmoid sinuses. No acute orbital abnormality. Other: Large right parietal scalp hematoma. IMPRESSION: 1. Thin right parafalcine subdural hematoma measuring 3 mm in maximum thickness. No midline shift. Additional right lateral convexity 2 mm subdural hematoma. Small amount of right subdural blood along the tentorium. 2. Atrophy and mild periventricular white matter small vessel disease. 3. Large right parietal scalp hematoma without underlying skull fracture. Critical Value/emergent results were called by telephone at the time of interpretation on 04/29/2016 at 4:55 pm to Dr. Jeri ModenaJEREMIAH GADDY , who verbally acknowledged these results. Electronically Signed   By: Jasmine PangKim  Fujinaga M.D.   On: 04/29/2016 16:55    ASSESSMENT AND PLAN:     1. Syncope - Likely orthostatic hypotension as she was positive during initial presentation. Now resolved. Differential includes vasovagal vs bradycardia vs valvular abnormality vs vertebrobasilar insufficiency. EKG without acute changes. Troponin negative. BNP normal. Telemetry shows sinus rhythm with a rate mostly in 70s with PACs and atrial bigeminy.  intermittently in 40s.   2. S/p AVR 2011 - Pending echo  3. Cardiomegaly with vascular congestion on CXR - She is euvolemic on exam. BNP normal. And in echocardiogram.  4. Subdural hematoma - Due to fall. Per primary. ASA and Plavix on hold. Repeat CT of head showed stable hematoma.   5. HTN - minimally up. Amlodipine on hold.   SignedManson Passey: Bhagat,Bhavinkumar, PA   04/30/2016,  4:37 PM Pager [phone]  Co-Sign MD   Patient seen and examined. Agree with assessment and plan. Ms. Heather Lyons is a 81 year old Caucasian female who underwent aortic valve replacement surgery in 2011. He has not been on warfarin therapy/suspect this is not a mechanical valve.  She has a history of hypertension, hyperlipidemia, type 2 diabetes mellitus with peripheral neuropathy, and has experienced chronic dyspnea with exertion. Review of her primary cardiologist note indicates that she has been on amlodipine as well as metoprolol tartrate. E states that she has been unable to tolerate blockers but apparently has been one half of metoprolol pill a day.  Yesterday she developed a syncopal spell.  She denied any prodrome of arrhythmia or chest pain.  She subsequently was found to be orthostatic relative to blood pressure.  She sustained two small subdural hematomas.  Her aspirin and Plavix has been held.  I am not certain as to why she was on dual antiplatelet therapy, he may require only reinstitution of aspirin 81 mg in several weeks once she is cleared neurosurgically. A follow-up CT today demonstrated a stable small right falcine, tentorium, and right lateral frontal convexity subdural hematoma and stable right parietal scalp hematoma and contusion without any new intracranial hemorrhage.  She has not been receiving amlodipine or metoprolol.  Her telemetry reveals predominant sinus rhythm with occasional to frequent PACs and has had transient atrial bigeminy. Her ECG from admission reveals normal sinus rhythm without ectopy at 71 bpm with bifascicular blockade (RBBB, LAHB).  Her BMP is normal.  Troponins are negative.  Initial orthostatics were positive,. Her amlodipine and metoprolol were held. Orthostatic recheck today did not reveal any evidence for residual orthostasis.  Her renal function argues against significant dehydration. A 2-D echo Doppler study has been scheduled but has not yet been done.   Agree with  outpatient monitor.  Her blood pressure  today has increased. Depending upon LV function, particularly with her diabetes mellitus, she may benefit from ARB therapy if she can tolerate (? ACE-I allergy). At present, will continue to monitor heart rhythm.  As long as she is not bradycardic, but continues to have frequent PACs she may benefit from re-institution of very low-dose beta blocker therapy, but will not do this presently.Lennette Bihari, MD, [facility] 04/30/2016 5:30 PM

## 2016-04-30 NOTE — Evaluation (Signed)
Physical Therapy Evaluation Patient Details Name: Heather Lyons MRN: [id number] DOB: 10/19/1934 Today's Date: 04/30/2016   History of Present Illness  81 y.o. female admitted with syncope and SDH, PMH of DM, CKD, R BBB, neuropathy, aortic valve replacement  Clinical Impression  Pt independently ambulated 200' without loss of balance. Orthostatics now negative (see flowsheets). Pt ready to DC home from PT standpoint.     Follow Up Recommendations No PT follow up    Equipment Recommendations  None recommended by PT    Recommendations for Other Services       Precautions / Restrictions Precautions Precautions: Fall Precaution Comments: no falls in past 1 year Restrictions Weight Bearing Restrictions: No      Mobility  Bed Mobility Overal bed mobility: Independent                Transfers Overall transfer level: Modified independent               General transfer comment: used armrests to push up  Ambulation/Gait Ambulation/Gait assistance: Independent Ambulation Distance (Feet): 200 Feet Assistive device: None Gait Pattern/deviations: WFL(Within Functional Limits)   Gait velocity interpretation: at or above normal speed for age/gender General Gait Details: steady, no LOB with head turns while walking  Stairs            Wheelchair Mobility    Modified Rankin (Stroke Patients Only)       Balance Overall balance assessment: Independent                                           Pertinent Vitals/Pain Pain Assessment: 0-10 Pain Score: 4  Pain Location: back of head Pain Descriptors / Indicators: Sore Pain Intervention(s): Premedicated before session;Monitored during session;Limited activity within patient's tolerance    Home Living Family/patient expects to be discharged to:: Private residence Living Arrangements: Alone Available Help at Discharge: Family;Available PRN/intermittently Type of Home: House Home Access:  Ramped entrance     Home Layout: One level Home Equipment: Walker - 2 wheels;Cane - single point;Toilet riser;Shower seat      Prior Function Level of Independence: Independent               Hand Dominance        Extremity/Trunk Assessment   Upper Extremity Assessment: Overall WFL for tasks assessed           Lower Extremity Assessment: RLE deficits/detail;LLE deficits/detail RLE Deficits / Details: decr sensation to light touch B feet, strength/AROM WFL BLEs    Cervical / Trunk Assessment: Kyphotic (forward head)  Communication   Communication: No difficulties  Cognition Arousal/Alertness: Awake/alert Behavior During Therapy: WFL for tasks assessed/performed Overall Cognitive Status: Within Functional Limits for tasks assessed                      General Comments      Exercises     Assessment/Plan    PT Assessment Patent does not need any further PT services  PT Problem List            PT Treatment Interventions      PT Goals (Current goals can be found in the Care Plan section)  Acute Rehab PT Goals Patient Stated Goal: likes to play card games PT Goal Formulation: All assessment and education complete, DC therapy    Frequency     Barriers to discharge  Co-evaluation               End of Session Equipment Utilized During Treatment: Gait belt Activity Tolerance: Patient tolerated treatment well Patient left: in bed;with call bell/phone within reach Nurse Communication: Mobility status    Functional Assessment Tool Used: clinical judgement Functional Limitation: Mobility: Walking and moving around Mobility: Walking and Moving Around Current Status [phone](G8978): 0 percent impaired, limited or restricted Mobility: Walking and Moving Around Goal Status [phone](G8979): 0 percent impaired, limited or restricted Mobility: Walking and Moving Around Discharge Status [phone](G8980): 0 percent impaired, limited or restricted    Time:  1204-1229 PT Time Calculation (min) (ACUTE ONLY): 25 min   Charges:   PT Evaluation $PT Eval Low Complexity: 1 Procedure PT Treatments $Gait Training: 8-22 mins   PT G Codes:   PT G-Codes **NOT FOR INPATIENT CLASS** Functional Assessment Tool Used: clinical judgement Functional Limitation: Mobility: Walking and moving around Mobility: Walking and Moving Around Current Status ([phone]([id number]): 0 percent impaired, limited or restricted Mobility: Walking and Moving Around Goal Status ([phone](G8979): 0 percent impaired, limited or restricted Mobility: Walking and Moving Around Discharge Status [phone](G8980): 0 percent impaired, limited or restricted    Heather Lyons, Heather Lyons 04/30/2016, 12:48 PM [phone]

## 2016-04-30 NOTE — Progress Notes (Signed)
1,000 mL NS IV bolus complete. Vital signs obtained. CT notified. RN will continue to monitor.

## 2016-04-30 NOTE — Progress Notes (Signed)
Transitions of Care Pharmacy Note  Plan:  -Discussed prior to admit medications with patient based on handwritten medication list brought in by patient's daughter. Clarified dosing/frequency for most medicines via patient recall as it was not noted on medication list brought in by daughter. PTA med list has been updated as best as possible. --------------------------------------------- Heather EvenerEdith Lyons is an 81 y.o. female who presents with a chief complaint syncope. In anticipation of discharge, pharmacy has reviewed this patient's prior to admission medication history, as well as current inpatient [facility].  Current medication indications, dosing, frequency, and notable side effects reviewed with patient and family. patient verbalized understanding of current inpatient medication regimen and is aware that the After Visit Summary when presented, will represent the most accurate medication list at discharge.   Assessment: Understanding of regimen: fair Understanding of indications: fair Potential of compliance: fair Barriers to Obtaining Medications: No  Patient instructed to contact inpatient pharmacy team with further questions or concerns if needed.    Time spent preparing for discharge counseling: 10 Time spent counseling patient: 20   Thank you for allowing pharmacy to be a part of this patient's care.  Heather HighlandMichael Pearline Yerby, PharmD PGY-1 Pharmacy Resident Pager: [phone] 04/30/2016

## 2016-04-30 NOTE — Progress Notes (Signed)
Patient provided medication list from 2015. Patient states "some doses have changed since that doctor visit".

## 2016-04-30 NOTE — Evaluation (Signed)
Occupational Therapy Evaluation and Discharge Patient Details Name: Heather Lyons MRN: [id number] DOB: 03/12/1935 Today's Date: 04/30/2016    History of Present Illness 81 y.o. female admitted with syncope and SDH, PMH of DM, CKD, R BBB, neuropathy, aortic valve replacement   Clinical Impression   Pt reports she was independent with ADL PTA. Currently pt overall supervision for ADL with min assist to don shoes this session. Educated pt on home safety and fall prevention. Pt planning to d/c home with intermittent family supervision. No further acute OT needs identified; signing off at this time. Please re-consult if needs change. Thank you for this referral.    Follow Up Recommendations  No OT follow up;Supervision - Intermittent    Equipment Recommendations  None recommended by OT    Recommendations for Other Services       Precautions / Restrictions Precautions Precautions: Fall Restrictions Weight Bearing Restrictions: No      Mobility Bed Mobility Overal bed mobility: Needs Assistance Bed Mobility: Supine to Sit     Supine to sit: Min assist     General bed mobility comments: Min hand held assist.  Transfers Overall transfer level: Needs assistance Equipment used: 1 person hand held assist Transfers: Sit to/from Stand Sit to Stand: Min assist         General transfer comment: Min assist from EOB. Supervision from toilet.    Balance Overall balance assessment: No apparent balance deficits (not formally assessed)                                          ADL Overall ADL's : Needs assistance/impaired Eating/Feeding: Independent;Sitting   Grooming: Supervision/safety;Wash/dry hands;Oral care;Sitting   Upper Body Bathing: Sitting;Set up   Lower Body Bathing: Supervison/ safety;Sit to/from stand   Upper Body Dressing : Set up;Sitting   Lower Body Dressing: Minimal assistance;Sit to/from stand Lower Body Dressing Details (indicate cue  type and reason): for shoes. Pt reports daughter can assist upon return home if needed Toilet Transfer: Supervision/safety;Ambulation;Comfort height toilet;RW   Toileting- Clothing Manipulation and Hygiene: Supervision/safety;Sit to/from stand   Tub/ Shower Transfer: Supervision/safety;Walk-in shower;Ambulation;Shower seat;Grab bars   Functional mobility during ADLs: Supervision/safety General ADL Comments: Educated pt on home safety and fall prevention.     Vision Vision Assessment?: No apparent visual deficits   Perception     Praxis      Pertinent Vitals/Pain Pain Assessment: Faces Faces Pain Scale: Hurts little more Pain Location: head Pain Descriptors / Indicators: Headache Pain Intervention(s): Repositioned;Monitored during session     Hand Dominance Right   Extremity/Trunk Assessment Upper Extremity Assessment Upper Extremity Assessment: Overall WFL for tasks assessed   Lower Extremity Assessment Lower Extremity Assessment: Defer to PT evaluation   Cervical / Trunk Assessment Cervical / Trunk Assessment: Kyphotic   Communication Communication Communication: No difficulties   Cognition Arousal/Alertness: Awake/alert Behavior During Therapy: WFL for tasks assessed/performed Overall Cognitive Status: Within Functional Limits for tasks assessed                     General Comments       Exercises       Shoulder Instructions      Home Living Family/patient expects to be discharged to:: Private residence Living Arrangements: Alone Available Help at Discharge: Family;Available PRN/intermittently Type of Home: House Home Access: Ramped entrance     Home Layout: One level  Bathroom Shower/Tub: Producer, television/film/videoWalk-in shower   Bathroom Toilet: Standard     Home Equipment: Environmental consultantWalker - 2 wheels;Cane - single point;Toilet riser;Shower seat          Prior Functioning/Environment Level of Independence: Independent                 OT Problem List:      OT Treatment/Interventions:      OT Goals(Current goals can be found in the care plan section) Acute Rehab OT Goals Patient Stated Goal: home today OT Goal Formulation: All assessment and education complete, DC therapy  OT Frequency:     Barriers to D/C:            Co-evaluation              End of Session Nurse Communication: Mobility status  Activity Tolerance: Patient tolerated treatment well Patient left: in chair;with call bell/phone within reach   Time: [id number] OT Time Calculation (min): 13 min Charges:  OT General Charges $OT Visit: 1 Procedure OT Evaluation $OT Eval Low Complexity: 1 Procedure G-Codes: OT G-codes **NOT FOR INPATIENT CLASS** Functional Assessment Tool Used: Clinical judgement Functional Limitation: Self care Self Care Current Status (G6440(G8987): At least 1 percent but less than 20 percent impaired, limited or restricted Self Care Goal Status (H4742(G8988): At least 1 percent but less than 20 percent impaired, limited or restricted Self Care Discharge Status [id number](G8989): At least 1 percent but less than 20 percent impaired, limited or restricted   Gaye AlkenBailey A Yoshiaki Kreuser M.S., OTR/L Pager: [phone]  04/30/2016, 4:45 PM

## 2016-04-30 NOTE — Progress Notes (Signed)
Family Medicine Teaching Service Daily Progress Note Intern Pager: [phone]  Patient name: Heather Lyons Medical record number: [id number] Date of birth: 10/04/1934 Age: 81 y.o. Gender: female  Primary Care Provider: Vernona RiegerLARK,KATHERINE, MD Consultants: None Code Status: FULL  Pt Overview and Major Events to Date:  11/02: Admit for syncope with head trauma 2/2 to fall with x2 subdural hematomas on CT head (2 cm and 3 cm) 11/03: Cardiology consulted concerning cardiac etiology for syncope  Assessment and Plan: Heather Lyons is a 81 y.o. female presenting with syncope. PMH is significant for HTN, DM2 with neuorpathy, colitis, diverticulosis, CKD2, anxiety, memory loss, HLD, vertebrobasilar insufficiency, s/p aortic valve replacement, and arthritis.  #Syncope, acute: Patient experienced loss of consciousness. Most likely secondary to orthostatic hypotension. Orthostatic vital signs positive in ED. Per EMS patient was bradycardic and mildly hypotensive. Other cause of syncopal episode could be vasovagal. History of syncopal episodes after triggers such as vomiting, diarrhea. Cannot rule out cardiovascular cause of syncope. EKG in ED showed RBBB (known RBBB follows with [facility] cards) and LAFB, PVCs. Compared to care everywhere EKGs unchanged. Trop neg. Some bradycardia per EMS but normal rate in ED. Rule out hypoglycemia, hypoxia, anemia as causes of syncope due to normal values. Exertional component to syncope with lack of typical prodrome and resultant trauma, cardiac cause high on differential. S/p aortic valve replacement. From chart review has known vertebrobasilar insufficiency for which she follows with neuro. The some symptoms of vertebrobasilar ischemia are dizziness, vertigo, drop attacks, etc. PT consulted without need for outpt f/u. --Monitor on telemetry --Pending ECHO to rule out structural heart disease --Cardiology consulted, appreciate recommendations  --May benefit from a 30-day Holter  monitor --Discontinue doxepin and amitriptyline as these could cause syncope with advanced age  #Subdural Hematoma, acute, stable: From traumatic fall from syncopal episode. Two areas appreciated: thin right parafalcine subdural hematoma measuring 3mm in maximum thickness anteriorly. Additional tiny right lateral convexity subdural hematoma measuring 2mm in maximum thickness. Will manage nonoperatively at this time since <6610mm. Neuro exam intact. Not on anticoagulation. Hemodynamically stable. GCS 15.  --Repeat head CT in 7 hrs or sooner if exhibiting changes in exam --Hold all antiplatelets and anticoagulation --Curbside called neurology who agreed with above plan; may need to formally consult and repeat CT if neurological state declines  #Cardiomegaly and vascular congestion, stable: Noted on CXR. Patient with symptoms of fluid overload. Euvolemic to hypovolemic on exam. No known history of CHF. BNP was wnl.  --Pending ECHO --Monitor  #HTN, stable: Hypotension noted on admission. BP 136/69 this AM. --Hold off on antihypertensive medications --Monitor  #CKD stage 2, resolved: Cr baseline .8. On admission 1.05. Likely prerenal in setting of dehydration and orthostatic hypotension. Cr 0.88 on 11/03. --Recheck Cr in AM --Bolus as above --Avoid nephrotoxic medications    #T2DM with neuropathy, stable:  Last A1c 8.7 11/2015. Only on glipizide at home. --Continue Neurontin for neuropathy --Will obtain A1C --Sensitive SSI --Monitor CBGs  #HLD. Last lipid panel in 11/2015 showed total cholesterol 131 and LDL 76.  --On fenofibrate at home. Will continue --Holding aspirin and Plavix in setting of bleed --Refuses to use statins per chart review  #Cryptogenic cirrhosis: Likely fatty liver nash seeing gi. Elevated AST to 63.   #History of lymphocytic colitis: Recent flare 2 weeks ago.  Continues on doxepin from GI  FEN/GI: heart diet Prophylaxis: SCDs  Disposition: Pending  further work-up of syncopal episode, cardiology consulted, awaiting recs and ECHO  Subjective:  No events overnight. Patient  is in good spirits this AM. Says she [place] [employer] [place] and could not remember what happened after the fall but is aware of the situation. Says she would like to go home soon.  Objective: Temp:  [98.2 F (36.8 C)-98.7 F (37.1 C)] 98.2 F (36.8 C) (11/03 0543) Pulse Rate:  [43-74] 62 (11/03 0543) Resp:  [13-20] 20 (11/03 0543) BP: (115-178)/(54-83) 136/69 (11/03 0543) SpO2:  [92 %-99 %] 97 % (11/03 0543) Weight:  [225 lb (102.1 kg)-229 lb 14.4 oz (104.3 kg)] 229 lb 14.4 oz (104.3 kg) (11/02 1938) Physical Exam: General: well nourished, well developed, in no acute distress with non-toxic appearance HEENT: normocephalic, moist mucous membranes, dry 4x4 cm ecchymosis on occiput, PERRLA, EOMI Neck: supple, non-tender without lymphadenopathy CV: regular rate and rhythm without murmurs, rubs, or gallops Lungs: clear to auscultation bilaterally with normal work of breathing Abdomen: soft, non-tender, no masses or organomegaly palpable, normoactive bowel sounds Skin: warm, dry, no rashes or lesions, cap refill < 2 seconds Extremities: warm and well perfused, normal tone Neuro: CN II-XII, no slurred speech, A&O to person, place, time, and situation  Laboratory:  Recent Labs Lab 04/29/16 1703 04/29/16 1706 04/30/16 0216  WBC 5.0  --  4.7  HGB 11.6* 12.2 11.4*  HCT 36.7 36.0 36.5  PLT 177  --  179    Recent Labs Lab 04/29/16 1703 04/29/16 1706 04/30/16 0216  NA 136 137 139  K 4.5 4.5 4.0  CL 101 99* 105  CO2 27  --  28  BUN 15 17 13   CREATININE 1.05* 1.10* 0.88  CALCIUM 9.0  --  8.7*  PROT 6.5  --   --   BILITOT 0.6  --   --   ALKPHOS 44  --   --   ALT 42  --   --   AST 63*  --   --   GLUCOSE 167* 158* 145*   BNP: 84.2 Troponin: Neg A1c: Pending  Imaging/Diagnostic Tests: ECHO (Pending)  CT Head Wo Contrast  (04/29/16) FINDINGS: Brain: There is no acute territorial infarction identified.  There is a thin right parafalcine subdural hematoma, this measures 3 mm in maximum thickness anteriorly. There is asymmetric thickening and increased density along the right tentorium, also suspected to represent small subdural hematoma. Additional tiny right lateral convexity subdural hematoma measuring 2 mm in maximum thickness, series 203, image number 21. No midline shift. No focal masses. Mild atrophy. Mild periventricular white matter hypodensity consistent with small vessel disease. Old appearing lacunar infarct in the right basal ganglia.  Vascular: No hyperdense vessels. There are dense calcifications within the carotid arteries.  Skull: The mastoid air cells are clear. There is no depressed skull fracture.  Sinuses/Orbits: Mild mucosal thickening in the ethmoid sinuses. No acute orbital abnormality.  Other: Large right parietal scalp hematoma.  IMPRESSION: 1. Thin right parafalcine subdural hematoma measuring 3 mm in maximum thickness. No midline shift. Additional right lateral convexity 2 mm subdural hematoma. Small amount of right subdural blood along the tentorium. 2. Atrophy and mild periventricular white matter small vessel disease. 3. Large right parietal scalp hematoma without underlying skull fracture.  CT Head Wo Contrast (04/30/16) FINDINGS: Brain: Stable small subdural along the right falx, right tentorium, and overlying right lateral frontal lobe. No evidence for new intracranial hemorrhage, significant mass effect, or large territory infarct. Stable background of mild chronic microvascular ischemic changes and parenchymal volume loss. No high-dose  Vascular: No hyperdense vessel. Calcific atherosclerosis of  internal carotid arteries.  Skull: No displaced calvarial fracture.  Sinuses/Orbits: No acute finding.  Other: Stable right parietal scalp hematoma  and contusion.  IMPRESSION: Stable small right falcine, tentorium, and right lateral frontal convexity subdural hematoma. Stable right parietal scalp hematoma and contusion. No new intracranial hemorrhage identified. No significant mass effect.  DG Chest 2 View (04/30/16) FINDINGS: The heart is moderately enlarged. Postop changes from sternotomy and aortic valve replacement are noted. Vascular congestion. Lungs are under aerated. No pneumothorax or pleural effusion. No Kerley B lines to suggest interstitial edema. Central pulmonary vessels are prominent likely due to volume overload.  IMPRESSION: Cardiomegaly and vascular congestion compatible with mild volume overload.    Wendee Beaversavid J Graeson Nouri, DO 04/30/2016, 7:25 AM PGY-1, [facility] Intern pager: [phone], text pages welcome

## 2016-05-01 ENCOUNTER — Inpatient Hospital Stay (HOSPITAL_COMMUNITY): Payer: Medicare Other

## 2016-05-01 DIAGNOSIS — I509 Heart failure, unspecified: Secondary | ICD-10-CM

## 2016-05-01 LAB — ECHOCARDIOGRAM COMPLETE
AO mean calculated velocity dopler: 173 cm/s
AOVTI: 45.8 cm
AV Area mean vel: 1.35 cm2
AV VEL mean LVOT/AV: 0.6
AV area mean vel ind: 0.62 cm2/m2
AVA: 1.51 cm2
AVAREAVTI: 1.4 cm2
AVAREAVTIIND: 0.7 cm2/m2
AVG: 14 mmHg
AVPG: 26 mmHg
AVPKVEL: 255 cm/s
Ao pk vel: 0.62 m/s
CHL CUP AV PEAK INDEX: 0.65
CHL CUP AV VEL: 1.51
CHL CUP LVOT MV VTI INDEX: 0.63 cm2/m2
CHL CUP LVOT MV VTI: 1.37
CHL CUP RV SYS PRESS: 29 mmHg
FS: 44 % (ref 28–44)
HEIGHTINCHES: 68 in
IV/PV OW: 0.8
LA ID, A-P, ES: 45 mm
LA diam index: 2.07 cm/m2
LA vol index: 44.7 mL/m2
LAVOL: 96.9 mL
LAVOLA4C: 122 mL
LEFT ATRIUM END SYS DIAM: 45 mm
LV PW d: 15 mm — AB (ref 0.6–1.1)
LV TDI E'LATERAL: 6.96
LVELAT: 6.96 cm/s
LVOT VTI: 30.5 cm
LVOT area: 2.27 cm2
LVOT diameter: 17 mm
LVOT peak VTI: 0.67 cm
LVOT peak grad rest: 10 mmHg
LVOT peak vel: 157 cm/s
LVOTSV: 69 mL
Lateral S' vel: 9.4 cm/s
MV Annulus VTI: 50.6 cm
MV M vel: 121
MVG: 7 mmHg
MVPKEVEL: 0.6 m/s
Reg peak vel: 254 cm/s
TAPSE: 16.1 mm
TDI e' medial: 5.33
TR max vel: 254 cm/s
Valve area index: 0.7
Weight: 3678.4 oz

## 2016-05-01 LAB — GLUCOSE, CAPILLARY
GLUCOSE-CAPILLARY: 143 mg/dL — AB (ref 65–99)
Glucose-Capillary: 180 mg/dL — ABNORMAL HIGH (ref 65–99)

## 2016-05-01 LAB — BASIC METABOLIC PANEL
ANION GAP: 4 — AB (ref 5–15)
BUN: 12 mg/dL (ref 6–20)
CHLORIDE: 104 mmol/L (ref 101–111)
CO2: 26 mmol/L (ref 22–32)
Calcium: 8.8 mg/dL — ABNORMAL LOW (ref 8.9–10.3)
Creatinine, Ser: 0.81 mg/dL (ref 0.44–1.00)
GFR calc Af Amer: >60 mL/min (ref >60)
Glucose, Bld: 156 mg/dL — ABNORMAL HIGH (ref 65–99)
POTASSIUM: 3.8 mmol/L (ref 3.5–5.1)
SODIUM: 134 mmol/L — AB (ref 135–145)

## 2016-05-01 LAB — CBC
HCT: 37.7 % (ref 36.0–46.0)
HEMOGLOBIN: 11.9 g/dL — AB (ref 12.0–15.0)
MCH: 26.2 pg (ref 26.0–34.0)
MCHC: 31.6 g/dL (ref 30.0–36.0)
MCV: 83 fL (ref 78.0–100.0)
PLATELETS: 182 10*3/uL (ref 150–400)
RBC: 4.54 MIL/uL (ref 3.87–5.11)
RDW: 14.1 % (ref 11.5–15.5)
WBC: 4.1 10*3/uL (ref 4.0–10.5)

## 2016-05-01 LAB — HEMOGLOBIN A1C
HEMOGLOBIN A1C: 9.1 % — AB (ref 4.8–5.6)
Mean Plasma Glucose: 214 mg/dL

## 2016-05-01 MED ORDER — MELATONIN 3 MG PO TABS
6.0000 mg | ORAL_TABLET | Freq: Every evening | ORAL | Status: DC | PRN
  Administered 2016-05-01: 6 mg via ORAL
  Filled 2016-05-01 (×2): qty 2

## 2016-05-01 MED ORDER — NON FORMULARY: 6.0000 mg | Freq: Every day | Status: DC | PRN

## 2016-05-01 NOTE — Progress Notes (Signed)
  Echocardiogram 2D Echocardiogram has been performed.  Delcie RochENNINGTON, Trinitee Horgan 05/01/2016, 11:12 AM

## 2016-05-01 NOTE — Discharge Instructions (Signed)
You were admitted for passing out and sustaining a fall causing a subdural hematoma (brain bleed). A repeat CT of your head showed your bleed was stable. Your aspirin and plavix were held during your admission. We obtained an ECHO of your heart to evaluate for cardiac sources. Cardiology believe your cause for the passing out was less likely related to the heart.  I discussed with neurology restarting your aspirin and plavix who recommended restarting aspirin 81 mg when you leave but stopping the plavix until you see your neuologist on 11/06 at which point they can decide when to restart. You will need to follow up with your cardiologist for getting a 30-day holter monitor which will assess your heart beats for 1 month.

## 2016-05-01 NOTE — Discharge Summary (Signed)
[facility]  Patient name: Heather Lyons record number: [id number] Date of birth: 01/07/1935 Age: 81 y.o. Gender: female Date of Admission: 04/29/2016  Date of Discharge: 05/01/16 Admitting Physician: Doreene ElandKehinde T Eniola, MD  Primary Care Provider: Vernona RiegerLARK,KATHERINE, MD Consultants: Cardiology  Indication for Hospitalization: Syncope with subdural hematoma  Discharge Diagnoses/Problem List:  Subdural hematoma Syncope Aortic valve replacement Vertebrobasilar insufficiency Hypertension Diabetes mellitus type 2 CKD stage 2  Disposition: Home  Discharge Condition:  Stable, improved  Discharge Exam:  General: well nourished, well developed, in no acute distress with non-toxic appearance HEENT: normocephalic, moist mucous membranes, dry 4x4 cm ecchymosis on occiput, PERRLA, EOMI Neck: supple, non-tender without lymphadenopathy CV: regular rate and rhythm without murmurs, rubs, or gallops Lungs: clear to auscultation bilaterally with normal work of breathing Abdomen: soft, non-tender, no masses or organomegaly palpable, normoactive bowel sounds Skin: warm, dry, no rashes or lesions, cap refill < 2 seconds Extremities: warm and well perfused, normal tone Neuro: CN II-XII, no slurred speech, A&O to person, place, time, and situation, no ataxia or fine motor deficits  Brief Hospital Course:  Otis Bracedith Gravesis a 81 y.o.femalepresenting with syncope. PMH is significant for HTN, DM2 with neuorpathy, colitis, diverticulosis, CKD2, anxiety, memory loss, HLD, vertebrobasilar insufficiency, s/p aortic valve replacement, and arthritis.  Patient experienced syncopal episode with brief dizzy spell while [place] [employer] shopping and fell backwards sustaining trauma to occiput. Upon arrival to ED, patient was bradycardic but otherwise stable without neurological deficits. CT head showed x2 stable subdural hematomas measuring 2 mm and the other 3 mm w/o  midline shift. A repeat head CT confirmed they were stable. Neurology was curbside consulted. Plavix and aspirin were held. Orthostatic vitals were positive. EKG remained unchanged from previous. Glucose and CBC were unremarkable. Troponin neg with unremarkable BNP. Patient remained normotensive with regular heart rate during admission and without neurological deficits. ECHO was performed and cardiology recommended patient follow up with patients cardiologist outpatient for 30-day Holter monitor.  Patient was discharged with neurology follow up in 2 days and was instructed to continue aspirin but hold plavix. Blood pressure meds were held and patient was instructed to follow up with cardiologist.  Issues for Follow Up:  1. F/u with neurologist on 11/06. Continued on aspirin but plavix held until instructed by neurology. 2. Patient needs cardiology f/u for syncope work up. Holter monitor is warranted given cardiac history. 3. Amitriptyline and doxepin were held given syncope and advanced age. 4. Amlodipine was held. Consider restarting when appropriate.  Significant Procedures: None  Significant Labs and Imaging:   Recent Labs Lab 04/29/16 1703 04/29/16 1706 04/30/16 0216 05/01/16 0529  WBC 5.0  --  4.7 4.1  HGB 11.6* 12.2 11.4* 11.9*  HCT 36.7 36.0 36.5 37.7  PLT 177  --  179 182    Recent Labs Lab 04/29/16 1703 04/29/16 1706 04/30/16 0216 05/01/16 0529  NA 136 137 139 134*  K 4.5 4.5 4.0 3.8  CL 101 99* 105 104  CO2 27  --  28 26  GLUCOSE 167* 158* 145* 156*  BUN 15 17 13 12   CREATININE 1.05* 1.10* 0.88 0.81  CALCIUM 9.0  --  8.7* 8.8*  ALKPHOS 44  --   --   --   AST 63*  --   --   --   ALT 42  --   --   --   ALBUMIN 3.3*  --   --   --  BNP: 84.2 Troponin: Neg  CT Head Wo Contrast (04/29/16) FINDINGS: Brain: There is no acute territorial infarction identified.  There is a thin right parafalcine subdural hematoma, this measures 3 mm in maximum thickness  anteriorly. There is asymmetric thickening and increased density along the right tentorium, also suspected to represent small subdural hematoma. Additional tiny right lateral convexity subdural hematoma measuring 2 mm in maximum thickness, series 203, image number 21. No midline shift. No focal masses. Mild atrophy. Mild periventricular white matter hypodensity consistent with small vessel disease. Old appearing lacunar infarct in the right basal ganglia.  Vascular: No hyperdense vessels. There are dense calcifications within the carotid arteries.  Skull: The mastoid air cells are clear. There is no depressed skull fracture.  Sinuses/Orbits: Mild mucosal thickening in the ethmoid sinuses. No acute orbital abnormality.  Other: Large right parietal scalp hematoma.  IMPRESSION: 1. Thin right parafalcine subdural hematoma measuring 3 mm in maximum thickness. No midline shift. Additional right lateral convexity 2 mm subdural hematoma. Small amount of right subdural blood along the tentorium. 2. Atrophy and mild periventricular white matter small vessel disease. 3. Large right parietal scalp hematoma without underlying skull fracture.  CT Head Wo Contrast (04/30/16) FINDINGS: Brain: Stable small subdural along the right falx, right tentorium, and overlying right lateral frontal lobe. No evidence for new intracranial hemorrhage, significant mass effect, or large territory infarct. Stable background of mild chronic microvascular ischemic changes and parenchymal volume loss. No high-dose  Vascular: No hyperdense vessel. Calcific atherosclerosis of internal carotid arteries.  Skull: No displaced calvarial fracture.  Sinuses/Orbits: No acute finding.  Other: Stable right parietal scalp hematoma and contusion.  IMPRESSION: Stable small right falcine, tentorium, and right lateral frontal convexity subdural hematoma. Stable right parietal scalp hematoma and contusion.  No new intracranial hemorrhage identified. No significant mass effect.  DG Chest 2 View (04/30/16) FINDINGS: The heart is moderately enlarged. Postop changes from sternotomy and aortic valve replacement are noted. Vascular congestion. Lungs are under aerated. No pneumothorax or pleural effusion. No Kerley B lines to suggest interstitial edema. Central pulmonary vessels are prominent likely due to volume overload.  IMPRESSION: Cardiomegaly and vascular congestion compatible with mild volume overload.  Results/Tests Pending at Time of Discharge: Complete transthoracic ECHO  Discharge Medications:    Medication List    STOP taking these medications   amitriptyline 25 MG tablet Commonly known as:  ELAVIL   amLODipine 10 MG tablet Commonly known as:  NORVASC   clopidogrel 75 MG tablet Commonly known as:  PLAVIX   doxepin 25 MG capsule Commonly known as:  SINEQUAN     TAKE these medications   aspirin 81 MG chewable tablet Chew 81 mg by mouth every other day. Pt alternates aspirin and Plavix due to previous nosebleeds.   cetirizine 10 MG tablet Commonly known as:  ZYRTEC Take 10 mg by mouth daily.   cholecalciferol 1000 units tablet Commonly known as:  VITAMIN D Take 5,000 Units by mouth daily.   Coenzyme Q10 200 MG Tabs Take 200 mg by mouth daily.   fenofibrate 160 MG tablet Take 160 mg by mouth daily.   Flaxseed Oil 1000 MG Caps Take 1 capsule by mouth daily.   FOLBIC 2.5-25-2 MG Tabs tablet Generic drug:  folic acid-pyridoxine-cyancobalamin Take 1 tablet by mouth every 12 (twelve) hours.   gabapentin 100 MG capsule Commonly known as:  NEURONTIN Take 400 mg by mouth at bedtime.   glipiZIDE 5 MG tablet Commonly known as:  GLUCOTROL Take 20  mg by mouth daily before breakfast.   Melatonin 5 MG Caps Take 5 mg by mouth at bedtime.   naproxen sodium 220 MG tablet Commonly known as:  ANAPROX Take 220 mg by mouth daily.   omega-3 acid ethyl esters 1 g  capsule Commonly known as:  LOVAZA Take 1 g by mouth daily.   Red Yeast Rice 600 MG Caps Take 1 capsule by mouth daily.   vitamin C 1000 MG tablet Take 1,000 mg by mouth daily.       Discharge Instructions: Please refer to Patient Instructions section of EMR for full details.  Patient was counseled important signs and symptoms that should prompt return to [facility] care, changes in medications, dietary instructions, activity restrictions, and follow up appointments.   Follow-Up Appointments: Follow-up Information    Vernona RiegerLARK,KATHERINE, MD. Schedule an appointment as soon as possible for a visit today.   Specialty:  Internal Medicine Why:  Hospital follow up. Need Cardiology follow up.       [facility]. Go on 05/03/2016.   Specialty:  Neurology Why:  Follow up for syncope. Contact information: [address] [place] [address] [phone]        Hedwig MortonOHRBECK,STEVEN C, MD. Schedule an appointment as soon as possible for a visit today.   Specialty:  Cardiology Why:  Please make appointment ASAP to schedule for a 30-day holter monitor. Contact information: [address] [place] [address] [phone]           Wendee Beaversavid J Virtie Bungert, DO 05/01/2016, 12:41 PM PGY-1, [facility]

## 2016-05-01 NOTE — Progress Notes (Signed)
Subjective:    No complaints  Objective:   Temp:  [98 F (36.7 C)-99.3 F (37.4 C)] 98 F (36.7 C) (11/04 0522) Pulse Rate:  [48-83] 63 (11/04 0522) Resp:  [18-20] 20 (11/04 0522) BP: (133-157)/(57-86) 135/59 (11/04 0522) SpO2:  [95 %-100 %] 96 % (11/04 0522) Last BM Date: 04/30/16  Filed Weights   04/29/16 1534 04/29/16 1938  Weight: 225 lb (102.1 kg) 229 lb 14.4 oz (104.3 kg)    Intake/Output Summary (Last 24 hours) at 05/01/16 0855 Last data filed at 04/30/16 2200  Gross per 24 hour  Intake              240 ml  Output                0 ml  Net              240 ml    Telemetry:SR, PACs  Exam:  General: NAD  HEENT: sclera clear, throat clear  Resp: CTAB  Cardiac:RRR, no m/r/g, no jvd NW:GNFAOZHGI:abdomen soft, Nt, ND  MSK:no LE edema  Neuro: no focal deficits  Psych: appropriate affect  Lab Results:  Basic Metabolic Panel:  Recent Labs Lab 04/29/16 1703 04/29/16 1706 04/30/16 0216 05/01/16 0529  NA 136 137 139 134*  K 4.5 4.5 4.0 3.8  CL 101 99* 105 104  CO2 27  --  28 26  GLUCOSE 167* 158* 145* 156*  BUN 15 17 13 12   CREATININE 1.05* 1.10* 0.88 0.81  CALCIUM 9.0  --  8.7* 8.8*    Liver Function Tests:  Recent Labs Lab 04/29/16 1703  AST 63*  ALT 42  ALKPHOS 44  BILITOT 0.6  PROT 6.5  ALBUMIN 3.3*    CBC:  Recent Labs Lab 04/29/16 1703 04/29/16 1706 04/30/16 0216 05/01/16 0529  WBC 5.0  --  4.7 4.1  HGB 11.6* 12.2 11.4* 11.9*  HCT 36.7 36.0 36.5 37.7  MCV 83.6  --  83.5 83.0  PLT 177  --  179 182    Cardiac Enzymes:  Recent Labs Lab 04/29/16 2240  TROPONINI <0.03    BNP: No results for input(s): PROBNP in the last 8760 hours.  Coagulation: No results for input(s): INR in the last 168 hours.  ECG:   Medications:   Scheduled Medications: . gabapentin  400 mg Oral QHS  . insulin aspart  0-5 Units Subcutaneous QHS  . insulin aspart  0-9 Units Subcutaneous TID WC  . naproxen  250 mg Oral Daily      Infusions:     PRN Medications:  Melatonin     Assessment/Plan   1. Syncope - probable orthostatic syncope, SBP drops 26 ponts with standing. Elevated Cr and BUN on admit that resolved with IVFs, consistent with hypovolemia. Orthostatics repeated yesterday, significant drop in diastolic bp with standing. Will repeat orthostatics today, if persists despite fluid resuscitation could have some component of autonomic insuffiiciency given her uncontrolled DM2 and also history of peripheral neuropathy, could consider florinef or midodrine.  She does report a chronic history of orthostatic symptoms. Recent diarrhea as well which may have contirbuted.  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 No bradycardia on telemetry. Defer consideration for possible outpatient monitor to her outside cardiologist [facility]. At this time I think a bradycarrhtymia is less likely. Giver her trifascicular block would avoid av nodal agents  2. S/p AVR - awaiting echo  3. Subdural hematoma - per primary team        Dina RichJonathan Kalenna Millett, M.D., F.A.C.C.Patient ID: Heather Lyons, female   DOB: 01/02/1935, [age] y.o.   MRN: [id number]

## 2019-01-01 ENCOUNTER — Other Ambulatory Visit: Payer: Self-pay

## 2019-01-01 ENCOUNTER — Inpatient Hospital Stay (HOSPITAL_COMMUNITY)
Admission: EM | Admit: 2019-01-01 | Discharge: 2019-01-19 | DRG: 003 | Disposition: A | Discharge status: Skilled Nursing Facility | Payer: Medicare Other | Attending: General Surgery | Admitting: General Surgery

## 2019-01-01 ENCOUNTER — Encounter (HOSPITAL_COMMUNITY): Payer: Self-pay | Admitting: Emergency Medicine

## 2019-01-01 ENCOUNTER — Emergency Department (HOSPITAL_COMMUNITY): Payer: Medicare Other

## 2019-01-01 DIAGNOSIS — K55069 Acute infarction of intestine, part and extent unspecified: Principal | ICD-10-CM | POA: Diagnosis present

## 2019-01-01 DIAGNOSIS — Z9911 Dependence on respirator [ventilator] status: Secondary | ICD-10-CM

## 2019-01-01 DIAGNOSIS — R109 Unspecified abdominal pain: Secondary | ICD-10-CM

## 2019-01-01 DIAGNOSIS — E872 Acidosis: Secondary | ICD-10-CM | POA: Diagnosis present

## 2019-01-01 DIAGNOSIS — Z952 Presence of prosthetic heart valve: Secondary | ICD-10-CM | POA: Diagnosis not present

## 2019-01-01 DIAGNOSIS — R9431 Abnormal electrocardiogram [ECG] [EKG]: Secondary | ICD-10-CM | POA: Diagnosis not present

## 2019-01-01 DIAGNOSIS — I2510 Atherosclerotic heart disease of native coronary artery without angina pectoris: Secondary | ICD-10-CM | POA: Diagnosis present

## 2019-01-01 DIAGNOSIS — E875 Hyperkalemia: Secondary | ICD-10-CM | POA: Diagnosis present

## 2019-01-01 DIAGNOSIS — Z794 Long term (current) use of insulin: Secondary | ICD-10-CM | POA: Diagnosis not present

## 2019-01-01 DIAGNOSIS — Z79899 Other long term (current) drug therapy: Secondary | ICD-10-CM

## 2019-01-01 DIAGNOSIS — D62 Acute posthemorrhagic anemia: Secondary | ICD-10-CM | POA: Diagnosis present

## 2019-01-01 DIAGNOSIS — T8112XA Postprocedural septic shock, initial encounter: Secondary | ICD-10-CM | POA: Diagnosis not present

## 2019-01-01 DIAGNOSIS — Y92239 Unspecified place in hospital as the place of occurrence of the external cause: Secondary | ICD-10-CM | POA: Diagnosis not present

## 2019-01-01 DIAGNOSIS — I248 Other forms of acute ischemic heart disease: Secondary | ICD-10-CM | POA: Diagnosis not present

## 2019-01-01 DIAGNOSIS — R609 Edema, unspecified: Secondary | ICD-10-CM | POA: Diagnosis not present

## 2019-01-01 DIAGNOSIS — J95821 Acute postprocedural respiratory failure: Secondary | ICD-10-CM | POA: Diagnosis not present

## 2019-01-01 DIAGNOSIS — E876 Hypokalemia: Secondary | ICD-10-CM | POA: Diagnosis not present

## 2019-01-01 DIAGNOSIS — M79642 Pain in left hand: Secondary | ICD-10-CM

## 2019-01-01 DIAGNOSIS — T8144XA Sepsis following a procedure, initial encounter: Secondary | ICD-10-CM | POA: Diagnosis not present

## 2019-01-01 DIAGNOSIS — K9189 Other postprocedural complications and disorders of digestive system: Secondary | ICD-10-CM | POA: Diagnosis not present

## 2019-01-01 DIAGNOSIS — Y92410 Unspecified street and highway as the place of occurrence of the external cause: Secondary | ICD-10-CM

## 2019-01-01 DIAGNOSIS — S299XXA Unspecified injury of thorax, initial encounter: Secondary | ICD-10-CM

## 2019-01-01 DIAGNOSIS — R413 Other amnesia: Secondary | ICD-10-CM | POA: Diagnosis present

## 2019-01-01 DIAGNOSIS — S82891A Other fracture of right lower leg, initial encounter for closed fracture: Secondary | ICD-10-CM | POA: Diagnosis present

## 2019-01-01 DIAGNOSIS — Z1159 Encounter for screening for other viral diseases: Secondary | ICD-10-CM

## 2019-01-01 DIAGNOSIS — Z7982 Long term (current) use of aspirin: Secondary | ICD-10-CM

## 2019-01-01 DIAGNOSIS — S301XXA Contusion of abdominal wall, initial encounter: Secondary | ICD-10-CM | POA: Diagnosis present

## 2019-01-01 DIAGNOSIS — J811 Chronic pulmonary edema: Secondary | ICD-10-CM

## 2019-01-01 DIAGNOSIS — Z23 Encounter for immunization: Secondary | ICD-10-CM | POA: Diagnosis present

## 2019-01-01 DIAGNOSIS — J9601 Acute respiratory failure with hypoxia: Secondary | ICD-10-CM | POA: Diagnosis not present

## 2019-01-01 DIAGNOSIS — K567 Ileus, unspecified: Secondary | ICD-10-CM | POA: Diagnosis not present

## 2019-01-01 DIAGNOSIS — I517 Cardiomegaly: Secondary | ICD-10-CM | POA: Diagnosis not present

## 2019-01-01 DIAGNOSIS — I959 Hypotension, unspecified: Secondary | ICD-10-CM | POA: Diagnosis not present

## 2019-01-01 DIAGNOSIS — S62521A Displaced fracture of distal phalanx of right thumb, initial encounter for closed fracture: Secondary | ICD-10-CM | POA: Diagnosis present

## 2019-01-01 DIAGNOSIS — S20219A Contusion of unspecified front wall of thorax, initial encounter: Secondary | ICD-10-CM | POA: Diagnosis present

## 2019-01-01 DIAGNOSIS — N179 Acute kidney failure, unspecified: Secondary | ICD-10-CM | POA: Diagnosis present

## 2019-01-01 DIAGNOSIS — S272XXA Traumatic hemopneumothorax, initial encounter: Secondary | ICD-10-CM | POA: Diagnosis present

## 2019-01-01 DIAGNOSIS — S92009A Unspecified fracture of unspecified calcaneus, initial encounter for closed fracture: Secondary | ICD-10-CM

## 2019-01-01 DIAGNOSIS — J9690 Respiratory failure, unspecified, unspecified whether with hypoxia or hypercapnia: Secondary | ICD-10-CM

## 2019-01-01 DIAGNOSIS — Z888 Allergy status to other drugs, medicaments and biological substances status: Secondary | ICD-10-CM

## 2019-01-01 DIAGNOSIS — R7989 Other specified abnormal findings of blood chemistry: Secondary | ICD-10-CM

## 2019-01-01 DIAGNOSIS — E1165 Type 2 diabetes mellitus with hyperglycemia: Secondary | ICD-10-CM | POA: Diagnosis not present

## 2019-01-01 DIAGNOSIS — S8251XA Displaced fracture of medial malleolus of right tibia, initial encounter for closed fracture: Secondary | ICD-10-CM | POA: Diagnosis present

## 2019-01-01 DIAGNOSIS — S27322S Contusion of lung, bilateral, sequela: Secondary | ICD-10-CM | POA: Diagnosis not present

## 2019-01-01 DIAGNOSIS — Z9981 Dependence on supplemental oxygen: Secondary | ICD-10-CM

## 2019-01-01 DIAGNOSIS — I5032 Chronic diastolic (congestive) heart failure: Secondary | ICD-10-CM | POA: Diagnosis present

## 2019-01-01 DIAGNOSIS — S270XXA Traumatic pneumothorax, initial encounter: Secondary | ICD-10-CM

## 2019-01-01 DIAGNOSIS — M79601 Pain in right arm: Secondary | ICD-10-CM

## 2019-01-01 DIAGNOSIS — T1490XA Injury, unspecified, initial encounter: Secondary | ICD-10-CM

## 2019-01-01 DIAGNOSIS — J158 Pneumonia due to other specified bacteria: Secondary | ICD-10-CM | POA: Diagnosis not present

## 2019-01-01 DIAGNOSIS — S2242XA Multiple fractures of ribs, left side, initial encounter for closed fracture: Secondary | ICD-10-CM | POA: Diagnosis present

## 2019-01-01 DIAGNOSIS — E785 Hyperlipidemia, unspecified: Secondary | ICD-10-CM | POA: Diagnosis not present

## 2019-01-01 DIAGNOSIS — S92001A Unspecified fracture of right calcaneus, initial encounter for closed fracture: Secondary | ICD-10-CM | POA: Diagnosis present

## 2019-01-01 DIAGNOSIS — S52501A Unspecified fracture of the lower end of right radius, initial encounter for closed fracture: Secondary | ICD-10-CM | POA: Diagnosis present

## 2019-01-01 DIAGNOSIS — S2232XA Fracture of one rib, left side, initial encounter for closed fracture: Secondary | ICD-10-CM | POA: Diagnosis present

## 2019-01-01 DIAGNOSIS — J9621 Acute and chronic respiratory failure with hypoxia: Secondary | ICD-10-CM | POA: Diagnosis not present

## 2019-01-01 DIAGNOSIS — Z452 Encounter for adjustment and management of vascular access device: Secondary | ICD-10-CM

## 2019-01-01 DIAGNOSIS — Y838 Other surgical procedures as the cause of abnormal reaction of the patient, or of later complication, without mention of misadventure at the time of the procedure: Secondary | ICD-10-CM | POA: Diagnosis not present

## 2019-01-01 DIAGNOSIS — T17910A Gastric contents in respiratory tract, part unspecified causing asphyxiation, initial encounter: Secondary | ICD-10-CM

## 2019-01-01 DIAGNOSIS — J942 Hemothorax: Secondary | ICD-10-CM

## 2019-01-01 DIAGNOSIS — K559 Vascular disorder of intestine, unspecified: Secondary | ICD-10-CM

## 2019-01-01 DIAGNOSIS — J9602 Acute respiratory failure with hypercapnia: Secondary | ICD-10-CM | POA: Diagnosis not present

## 2019-01-01 DIAGNOSIS — G9341 Metabolic encephalopathy: Secondary | ICD-10-CM | POA: Diagnosis not present

## 2019-01-01 DIAGNOSIS — J90 Pleural effusion, not elsewhere classified: Secondary | ICD-10-CM

## 2019-01-01 DIAGNOSIS — S271XXD Traumatic hemothorax, subsequent encounter: Secondary | ICD-10-CM | POA: Diagnosis not present

## 2019-01-01 DIAGNOSIS — S62509A Fracture of unspecified phalanx of unspecified thumb, initial encounter for closed fracture: Secondary | ICD-10-CM

## 2019-01-01 DIAGNOSIS — J939 Pneumothorax, unspecified: Secondary | ICD-10-CM

## 2019-01-01 DIAGNOSIS — I110 Hypertensive heart disease with heart failure: Secondary | ICD-10-CM | POA: Diagnosis present

## 2019-01-01 DIAGNOSIS — E870 Hyperosmolality and hypernatremia: Secondary | ICD-10-CM | POA: Diagnosis not present

## 2019-01-01 DIAGNOSIS — R509 Fever, unspecified: Secondary | ICD-10-CM | POA: Diagnosis not present

## 2019-01-01 DIAGNOSIS — I9589 Other hypotension: Secondary | ICD-10-CM | POA: Diagnosis present

## 2019-01-01 DIAGNOSIS — Z953 Presence of xenogenic heart valve: Secondary | ICD-10-CM

## 2019-01-01 DIAGNOSIS — Z430 Encounter for attention to tracheostomy: Secondary | ICD-10-CM

## 2019-01-01 DIAGNOSIS — E871 Hypo-osmolality and hyponatremia: Secondary | ICD-10-CM | POA: Diagnosis not present

## 2019-01-01 DIAGNOSIS — R778 Other specified abnormalities of plasma proteins: Secondary | ICD-10-CM

## 2019-01-01 DIAGNOSIS — Z6837 Body mass index (BMI) 37.0-37.9, adult: Secondary | ICD-10-CM

## 2019-01-01 DIAGNOSIS — T859XXA Unspecified complication of internal prosthetic device, implant and graft, initial encounter: Secondary | ICD-10-CM

## 2019-01-01 DIAGNOSIS — I447 Left bundle-branch block, unspecified: Secondary | ICD-10-CM | POA: Diagnosis not present

## 2019-01-01 DIAGNOSIS — D696 Thrombocytopenia, unspecified: Secondary | ICD-10-CM | POA: Diagnosis present

## 2019-01-01 DIAGNOSIS — E119 Type 2 diabetes mellitus without complications: Secondary | ICD-10-CM | POA: Diagnosis present

## 2019-01-01 DIAGNOSIS — E669 Obesity, unspecified: Secondary | ICD-10-CM | POA: Diagnosis present

## 2019-01-01 DIAGNOSIS — I350 Nonrheumatic aortic (valve) stenosis: Secondary | ICD-10-CM | POA: Diagnosis present

## 2019-01-01 DIAGNOSIS — Z01818 Encounter for other preprocedural examination: Secondary | ICD-10-CM

## 2019-01-01 DIAGNOSIS — M25532 Pain in left wrist: Secondary | ICD-10-CM

## 2019-01-01 DIAGNOSIS — Z978 Presence of other specified devices: Secondary | ICD-10-CM

## 2019-01-01 DIAGNOSIS — E1169 Type 2 diabetes mellitus with other specified complication: Secondary | ICD-10-CM | POA: Diagnosis not present

## 2019-01-01 DIAGNOSIS — S5290XA Unspecified fracture of unspecified forearm, initial encounter for closed fracture: Secondary | ICD-10-CM

## 2019-01-01 DIAGNOSIS — K55029 Acute infarction of small intestine, extent unspecified: Secondary | ICD-10-CM | POA: Diagnosis not present

## 2019-01-01 DIAGNOSIS — Z930 Tracheostomy status: Secondary | ICD-10-CM | POA: Diagnosis not present

## 2019-01-01 DIAGNOSIS — J9811 Atelectasis: Secondary | ICD-10-CM

## 2019-01-01 DIAGNOSIS — E860 Dehydration: Secondary | ICD-10-CM | POA: Diagnosis present

## 2019-01-01 DIAGNOSIS — N170 Acute kidney failure with tubular necrosis: Secondary | ICD-10-CM | POA: Diagnosis not present

## 2019-01-01 HISTORY — DX: Atherosclerotic heart disease of native coronary artery without angina pectoris: I25.10

## 2019-01-01 LAB — COMPREHENSIVE METABOLIC PANEL
ALT: 40 U/L (ref 0–44)
AST: 66 U/L — ABNORMAL HIGH (ref 15–41)
Albumin: 3.3 g/dL — ABNORMAL LOW (ref 3.5–5.0)
Alkaline Phosphatase: 78 U/L (ref 38–126)
Anion gap: 9 (ref 5–15)
BUN: 12 mg/dL (ref 8–23)
CO2: 28 mmol/L (ref 22–32)
Calcium: 9.2 mg/dL (ref 8.9–10.3)
Chloride: 97 mmol/L — ABNORMAL LOW (ref 98–111)
Creatinine, Ser: 0.86 mg/dL (ref 0.44–1.00)
GFR calc Af Amer: >60 mL/min (ref >60)
GFR calc non Af Amer: >60 mL/min (ref >60)
Glucose, Bld: 280 mg/dL — ABNORMAL HIGH (ref 70–99)
Potassium: 3.7 mmol/L (ref 3.5–5.1)
Sodium: 134 mmol/L — ABNORMAL LOW (ref 135–145)
Total Bilirubin: 0.6 mg/dL (ref 0.3–1.2)
Total Protein: 6.9 g/dL (ref 6.5–8.1)

## 2019-01-01 LAB — SAMPLE TO BLOOD BANK

## 2019-01-01 LAB — I-STAT CHEM 8, ED
BUN: 13 mg/dL (ref 8–23)
Calcium, Ion: 1.18 mmol/L (ref 1.15–1.40)
Chloride: 98 mmol/L (ref 98–111)
Creatinine, Ser: 0.8 mg/dL (ref 0.44–1.00)
Glucose, Bld: 271 mg/dL — ABNORMAL HIGH (ref 70–99)
HCT: 39 % (ref 36.0–46.0)
Hemoglobin: 13.3 g/dL (ref 12.0–15.0)
Potassium: 3.7 mmol/L (ref 3.5–5.1)
Sodium: 136 mmol/L (ref 135–145)
TCO2: 29 mmol/L (ref 22–32)

## 2019-01-01 LAB — SARS CORONAVIRUS 2 BY RT PCR (HOSPITAL ORDER, PERFORMED IN ~~LOC~~ HOSPITAL LAB): SARS Coronavirus 2: NEGATIVE

## 2019-01-01 LAB — CBC
HCT: 40.3 % (ref 36.0–46.0)
Hemoglobin: 13.1 g/dL (ref 12.0–15.0)
MCH: 28.3 pg (ref 26.0–34.0)
MCHC: 32.5 g/dL (ref 30.0–36.0)
MCV: 87 fL (ref 80.0–100.0)
Platelets: 226 10*3/uL (ref 150–400)
RBC: 4.63 MIL/uL (ref 3.87–5.11)
RDW: 13.1 % (ref 11.5–15.5)
WBC: 13.9 10*3/uL — ABNORMAL HIGH (ref 4.0–10.5)
nRBC: 0 % (ref 0.0–0.2)

## 2019-01-01 LAB — LACTIC ACID, PLASMA: Lactic Acid, Venous: 2.5 mmol/L (ref 0.5–1.9)

## 2019-01-01 LAB — PROTIME-INR
INR: 1.1 (ref 0.8–1.2)
Prothrombin Time: 14 seconds (ref 11.4–15.2)

## 2019-01-01 LAB — CDS SEROLOGY

## 2019-01-01 LAB — ETHANOL: Alcohol, Ethyl (B): <10 mg/dL (ref <10)

## 2019-01-01 MED ORDER — FENTANYL CITRATE (PF) 100 MCG/2ML IJ SOLN
INTRAMUSCULAR | Status: AC
  Filled 2019-01-01: qty 2

## 2019-01-01 MED ORDER — IOHEXOL 300 MG/ML  SOLN
100.0000 mL | Freq: Once | INTRAMUSCULAR | Status: AC | PRN
  Administered 2019-01-01: 100 mL via INTRAVENOUS

## 2019-01-01 MED ORDER — LIDOCAINE HCL (PF) 1 % IJ SOLN
INTRAMUSCULAR | Status: AC
  Filled 2019-01-01: qty 5

## 2019-01-01 MED ORDER — ONDANSETRON HCL 4 MG/2ML IJ SOLN
4.0000 mg | Freq: Once | INTRAMUSCULAR | Status: AC
  Administered 2019-01-01: 4 mg via INTRAVENOUS

## 2019-01-01 MED ORDER — IOHEXOL 350 MG/ML SOLN: 100.0000 mL | Freq: Once | INTRAVENOUS | Status: DC | PRN

## 2019-01-01 MED ORDER — ONDANSETRON HCL 4 MG/2ML IJ SOLN
INTRAMUSCULAR | Status: AC
  Filled 2019-01-01: qty 2

## 2019-01-01 MED ORDER — FENTANYL CITRATE (PF) 100 MCG/2ML IJ SOLN
50.0000 ug | Freq: Once | INTRAMUSCULAR | Status: AC
  Administered 2019-01-02: 50 ug via INTRAVENOUS

## 2019-01-01 MED ORDER — TETANUS-DIPHTH-ACELL PERTUSSIS 5-2.5-18.5 LF-MCG/0.5 IM SUSP
0.5000 mL | Freq: Once | INTRAMUSCULAR | Status: AC
  Administered 2019-01-01: 0.5 mL via INTRAMUSCULAR
  Filled 2019-01-01: qty 0.5

## 2019-01-01 MED ORDER — LIDOCAINE HCL (PF) 1 % IJ SOLN
10.0000 mL | Freq: Once | INTRAMUSCULAR | Status: AC
  Administered 2019-01-01: 10 mL via INTRADERMAL
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
## 2019-01-01 NOTE — ED Notes (Signed)
Taken to CT.
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
## 2019-01-02 ENCOUNTER — Inpatient Hospital Stay (HOSPITAL_COMMUNITY): Payer: Medicare Other

## 2019-01-02 ENCOUNTER — Other Ambulatory Visit: Payer: Self-pay

## 2019-01-02 DIAGNOSIS — N189 Chronic kidney disease, unspecified: Secondary | ICD-10-CM

## 2019-01-02 DIAGNOSIS — E1165 Type 2 diabetes mellitus with hyperglycemia: Secondary | ICD-10-CM

## 2019-01-02 DIAGNOSIS — D62 Acute posthemorrhagic anemia: Secondary | ICD-10-CM

## 2019-01-02 DIAGNOSIS — N179 Acute kidney failure, unspecified: Secondary | ICD-10-CM

## 2019-01-02 DIAGNOSIS — S2242XA Multiple fractures of ribs, left side, initial encounter for closed fracture: Secondary | ICD-10-CM

## 2019-01-02 DIAGNOSIS — I517 Cardiomegaly: Secondary | ICD-10-CM

## 2019-01-02 LAB — BASIC METABOLIC PANEL
Anion gap: 18 — ABNORMAL HIGH (ref 5–15)
Anion gap: 9 (ref 5–15)
Anion gap: 13 (ref 5–15)
Anion gap: 11 (ref 5–15)
BUN: 16 mg/dL (ref 8–23)
BUN: 29 mg/dL — ABNORMAL HIGH (ref 8–23)
BUN: 29 mg/dL — ABNORMAL HIGH (ref 8–23)
BUN: 30 mg/dL — ABNORMAL HIGH (ref 8–23)
CO2: 16 mmol/L — ABNORMAL LOW (ref 22–32)
CO2: 23 mmol/L (ref 22–32)
CO2: 19 mmol/L — ABNORMAL LOW (ref 22–32)
CO2: 21 mmol/L — ABNORMAL LOW (ref 22–32)
Calcium: 8.6 mg/dL — ABNORMAL LOW (ref 8.9–10.3)
Calcium: 7.8 mg/dL — ABNORMAL LOW (ref 8.9–10.3)
Calcium: 8.2 mg/dL — ABNORMAL LOW (ref 8.9–10.3)
Calcium: 8.1 mg/dL — ABNORMAL LOW (ref 8.9–10.3)
Chloride: 101 mmol/L (ref 98–111)
Chloride: 100 mmol/L (ref 98–111)
Chloride: 103 mmol/L (ref 98–111)
Chloride: 104 mmol/L (ref 98–111)
Creatinine, Ser: 1.25 mg/dL — ABNORMAL HIGH (ref 0.44–1.00)
Creatinine, Ser: 2.25 mg/dL — ABNORMAL HIGH (ref 0.44–1.00)
Creatinine, Ser: 2.15 mg/dL — ABNORMAL HIGH (ref 0.44–1.00)
Creatinine, Ser: 2.27 mg/dL — ABNORMAL HIGH (ref 0.44–1.00)
GFR calc Af Amer: 47 mL/min — ABNORMAL LOW (ref >60)
GFR calc Af Amer: 23 mL/min — ABNORMAL LOW (ref >60)
GFR calc Af Amer: 24 mL/min — ABNORMAL LOW (ref >60)
GFR calc Af Amer: 23 mL/min — ABNORMAL LOW (ref >60)
GFR calc non Af Amer: 40 mL/min — ABNORMAL LOW (ref >60)
GFR calc non Af Amer: 20 mL/min — ABNORMAL LOW (ref >60)
GFR calc non Af Amer: 21 mL/min — ABNORMAL LOW (ref >60)
GFR calc non Af Amer: 20 mL/min — ABNORMAL LOW (ref >60)
Glucose, Bld: 453 mg/dL — ABNORMAL HIGH (ref 70–99)
Glucose, Bld: 364 mg/dL — ABNORMAL HIGH (ref 70–99)
Glucose, Bld: 233 mg/dL — ABNORMAL HIGH (ref 70–99)
Glucose, Bld: 166 mg/dL — ABNORMAL HIGH (ref 70–99)
Potassium: 4.6 mmol/L (ref 3.5–5.1)
Potassium: 6.8 mmol/L (ref 3.5–5.1)
Potassium: 5.9 mmol/L — ABNORMAL HIGH (ref 3.5–5.1)
Potassium: 5.6 mmol/L — ABNORMAL HIGH (ref 3.5–5.1)
Sodium: 135 mmol/L (ref 135–145)
Sodium: 132 mmol/L — ABNORMAL LOW (ref 135–145)
Sodium: 135 mmol/L (ref 135–145)
Sodium: 136 mmol/L (ref 135–145)

## 2019-01-02 LAB — CBC
HCT: 35.1 % — ABNORMAL LOW (ref 36.0–46.0)
HCT: 33.9 % — ABNORMAL LOW (ref 36.0–46.0)
HCT: 31.2 % — ABNORMAL LOW (ref 36.0–46.0)
Hemoglobin: 11.2 g/dL — ABNORMAL LOW (ref 12.0–15.0)
Hemoglobin: 10.9 g/dL — ABNORMAL LOW (ref 12.0–15.0)
Hemoglobin: 10 g/dL — ABNORMAL LOW (ref 12.0–15.0)
MCH: 28.1 pg (ref 26.0–34.0)
MCH: 28 pg (ref 26.0–34.0)
MCH: 28.2 pg (ref 26.0–34.0)
MCHC: 31.9 g/dL (ref 30.0–36.0)
MCHC: 32.2 g/dL (ref 30.0–36.0)
MCHC: 32.1 g/dL (ref 30.0–36.0)
MCV: 88.2 fL (ref 80.0–100.0)
MCV: 87.1 fL (ref 80.0–100.0)
MCV: 88.1 fL (ref 80.0–100.0)
Platelets: 219 10*3/uL (ref 150–400)
Platelets: 208 10*3/uL (ref 150–400)
Platelets: 186 10*3/uL (ref 150–400)
RBC: 3.98 MIL/uL (ref 3.87–5.11)
RBC: 3.89 MIL/uL (ref 3.87–5.11)
RBC: 3.54 MIL/uL — ABNORMAL LOW (ref 3.87–5.11)
RDW: 13.1 % (ref 11.5–15.5)
RDW: 13.4 % (ref 11.5–15.5)
RDW: 13.4 % (ref 11.5–15.5)
WBC: 19.4 10*3/uL — ABNORMAL HIGH (ref 4.0–10.5)
WBC: 17.5 10*3/uL — ABNORMAL HIGH (ref 4.0–10.5)
WBC: 17.1 10*3/uL — ABNORMAL HIGH (ref 4.0–10.5)
nRBC: 0 % (ref 0.0–0.2)
nRBC: 0 % (ref 0.0–0.2)
nRBC: 0 % (ref 0.0–0.2)

## 2019-01-02 LAB — URINALYSIS, ROUTINE W REFLEX MICROSCOPIC
Bilirubin Urine: NEGATIVE
Glucose, UA: 50 mg/dL — AB
Ketones, ur: NEGATIVE mg/dL
Leukocytes,Ua: NEGATIVE
Nitrite: NEGATIVE
Protein, ur: 100 mg/dL — AB
Specific Gravity, Urine: >1.046 — ABNORMAL HIGH (ref 1.005–1.030)
pH: 5 (ref 5.0–8.0)

## 2019-01-02 LAB — GLUCOSE, CAPILLARY
Glucose-Capillary: 397 mg/dL — ABNORMAL HIGH (ref 70–99)
Glucose-Capillary: 370 mg/dL — ABNORMAL HIGH (ref 70–99)
Glucose-Capillary: 312 mg/dL — ABNORMAL HIGH (ref 70–99)
Glucose-Capillary: 248 mg/dL — ABNORMAL HIGH (ref 70–99)
Glucose-Capillary: 135 mg/dL — ABNORMAL HIGH (ref 70–99)

## 2019-01-02 LAB — ECHOCARDIOGRAM COMPLETE
Height: 65.984 in
Weight: 3495.61 oz

## 2019-01-02 LAB — CREATININE, URINE, RANDOM: Creatinine, Urine: 95.43 mg/dL

## 2019-01-02 LAB — MRSA PCR SCREENING: MRSA by PCR: NEGATIVE

## 2019-01-02 LAB — HEMOGLOBIN A1C
Hgb A1c MFr Bld: 9.5 % — ABNORMAL HIGH (ref 4.8–5.6)
Mean Plasma Glucose: 225.95 mg/dL

## 2019-01-02 LAB — SODIUM, URINE, RANDOM: Sodium, Ur: 29 mmol/L

## 2019-01-02 LAB — CK: Total CK: 4632 U/L — ABNORMAL HIGH (ref 38–234)

## 2019-01-02 LAB — POTASSIUM: Potassium: 6.6 mmol/L (ref 3.5–5.1)

## 2019-01-02 MED ORDER — PHENYLEPHRINE HCL-NACL 10-0.9 MG/250ML-% IV SOLN
INTRAVENOUS | Status: AC
  Administered 2019-01-02: 20 ug/min via INTRAVENOUS
  Filled 2019-01-02: qty 250

## 2019-01-02 MED ORDER — HYDRALAZINE HCL 20 MG/ML IJ SOLN: 10.0000 mg | INTRAMUSCULAR | Status: DC | PRN

## 2019-01-02 MED ORDER — METHOCARBAMOL 1000 MG/10ML IJ SOLN
1000.0000 mg | Freq: Three times a day (TID) | INTRAVENOUS | Status: DC | PRN
  Filled 2019-01-02 (×2): qty 10

## 2019-01-02 MED ORDER — INSULIN DETEMIR 100 UNIT/ML ~~LOC~~ SOLN
45.0000 [IU] | Freq: Every day | SUBCUTANEOUS | Status: DC
  Filled 2019-01-02: qty 0.45

## 2019-01-02 MED ORDER — INSULIN DETEMIR 100 UNIT/ML ~~LOC~~ SOLN
45.0000 [IU] | Freq: Every day | SUBCUTANEOUS | Status: DC
  Administered 2019-01-02: 45 [IU] via SUBCUTANEOUS
  Filled 2019-01-02 (×2): qty 0.45

## 2019-01-02 MED ORDER — FUROSEMIDE 10 MG/ML IJ SOLN: 20.0000 mg | Freq: Once | INTRAMUSCULAR | Status: DC

## 2019-01-02 MED ORDER — SODIUM BICARBONATE 8.4 % IV SOLN
INTRAVENOUS | Status: AC
  Administered 2019-01-02: 50 meq via INTRAVENOUS
  Filled 2019-01-02: qty 50

## 2019-01-02 MED ORDER — SODIUM BICARBONATE 8.4 % IV SOLN
50.0000 meq | Freq: Once | INTRAVENOUS | Status: AC
  Administered 2019-01-02: 17:00:00 50 meq via INTRAVENOUS

## 2019-01-02 MED ORDER — OXYCODONE HCL 5 MG PO TABS: 5.0000 mg | ORAL_TABLET | ORAL | Status: DC | PRN

## 2019-01-02 MED ORDER — FENTANYL CITRATE (PF) 100 MCG/2ML IJ SOLN
50.0000 ug | Freq: Once | INTRAMUSCULAR | Status: AC
  Administered 2019-01-02: 50 ug via INTRAVENOUS

## 2019-01-02 MED ORDER — ACETAMINOPHEN 325 MG PO TABS: 650.0000 mg | ORAL_TABLET | ORAL | Status: DC | PRN

## 2019-01-02 MED ORDER — ACETAMINOPHEN 500 MG PO TABS
1000.0000 mg | ORAL_TABLET | Freq: Four times a day (QID) | ORAL | Status: DC
  Administered 2019-01-02 – 2019-01-03 (×4): 1000 mg via ORAL
  Filled 2019-01-02 (×4): qty 2

## 2019-01-02 MED ORDER — PERFLUTREN LIPID MICROSPHERE
1.0000 mL | INTRAVENOUS | Status: AC | PRN
  Administered 2019-01-02: 12:00:00 2 mL via INTRAVENOUS
  Filled 2019-01-02: qty 10

## 2019-01-02 MED ORDER — CHLORHEXIDINE GLUCONATE 0.12 % MT SOLN: 15.0000 mL | Freq: Two times a day (BID) | OROMUCOSAL | Status: DC

## 2019-01-02 MED ORDER — ONDANSETRON 4 MG PO TBDP: 4.0000 mg | ORAL_TABLET | Freq: Four times a day (QID) | ORAL | Status: DC | PRN

## 2019-01-02 MED ORDER — SODIUM CHLORIDE 0.9 % IV BOLUS
500.0000 mL | Freq: Once | INTRAVENOUS | Status: AC
  Administered 2019-01-02: 09:00:00 500 mL via INTRAVENOUS

## 2019-01-02 MED ORDER — SODIUM BICARBONATE 8.4 % IV SOLN
INTRAVENOUS | Status: DC
  Filled 2019-01-02: qty 150

## 2019-01-02 MED ORDER — STERILE WATER FOR INJECTION IV SOLN
INTRAVENOUS | Status: AC
  Administered 2019-01-02: 23:00:00 via INTRAVENOUS
  Filled 2019-01-02 (×2): qty 850

## 2019-01-02 MED ORDER — ALBUMIN HUMAN 5 % IV SOLN
12.5000 g | Freq: Once | INTRAVENOUS | Status: AC
  Administered 2019-01-02: 05:00:00 12.5 g via INTRAVENOUS
  Filled 2019-01-02: qty 250

## 2019-01-02 MED ORDER — CALCIUM GLUCONATE-NACL 1-0.675 GM/50ML-% IV SOLN
1.0000 g | Freq: Once | INTRAVENOUS | Status: AC
  Administered 2019-01-02: 1000 mg via INTRAVENOUS
  Filled 2019-01-02: qty 50

## 2019-01-02 MED ORDER — PHENYLEPHRINE HCL-NACL 10-0.9 MG/250ML-% IV SOLN
0.0000 ug/min | INTRAVENOUS | Status: DC
  Administered 2019-01-02: 16:00:00 20 ug/min via INTRAVENOUS
  Administered 2019-01-02: 23:00:00 120 ug/min via INTRAVENOUS
  Administered 2019-01-02: 19:00:00 70 ug/min via INTRAVENOUS
  Administered 2019-01-02: 22:00:00 120 ug/min via INTRAVENOUS
  Administered 2019-01-03: 85 ug/min via INTRAVENOUS
  Administered 2019-01-03: 110 ug/min via INTRAVENOUS
  Administered 2019-01-03: 60 ug/min via INTRAVENOUS
  Administered 2019-01-03: 70 ug/min via INTRAVENOUS
  Administered 2019-01-03: 85 ug/min via INTRAVENOUS
  Administered 2019-01-03: 120 ug/min via INTRAVENOUS
  Administered 2019-01-03 (×2): 85 ug/min via INTRAVENOUS
  Filled 2019-01-02: qty 250
  Filled 2019-01-02: qty 500
  Filled 2019-01-02 (×3): qty 250
  Filled 2019-01-02: qty 500
  Filled 2019-01-02: qty 250
  Filled 2019-01-02: qty 500
  Filled 2019-01-02: qty 250

## 2019-01-02 MED ORDER — IPRATROPIUM-ALBUTEROL 0.5-2.5 (3) MG/3ML IN SOLN
3.0000 mL | Freq: Four times a day (QID) | RESPIRATORY_TRACT | Status: DC
  Administered 2019-01-02 – 2019-01-10 (×35): 3 mL via RESPIRATORY_TRACT
  Filled 2019-01-02 (×35): qty 3

## 2019-01-02 MED ORDER — TRAMADOL HCL 50 MG PO TABS
50.0000 mg | ORAL_TABLET | Freq: Four times a day (QID) | ORAL | Status: DC
  Administered 2019-01-02 – 2019-01-03 (×5): 50 mg via ORAL
  Filled 2019-01-02 (×5): qty 1

## 2019-01-02 MED ORDER — ONDANSETRON HCL 4 MG/2ML IJ SOLN
4.0000 mg | Freq: Four times a day (QID) | INTRAMUSCULAR | Status: DC | PRN
  Administered 2019-01-02 – 2019-01-16 (×6): 4 mg via INTRAVENOUS
  Filled 2019-01-02 (×5): qty 2

## 2019-01-02 MED ORDER — SODIUM ZIRCONIUM CYCLOSILICATE 10 G PO PACK
10.0000 g | PACK | Freq: Two times a day (BID) | ORAL | Status: DC
  Administered 2019-01-02: 10 g via ORAL
  Filled 2019-01-02 (×4): qty 1

## 2019-01-02 MED ORDER — SODIUM CHLORIDE 0.9 % IV BOLUS
500.0000 mL | Freq: Once | INTRAVENOUS | Status: AC
  Administered 2019-01-02: 500 mL via INTRAVENOUS

## 2019-01-02 MED ORDER — DEXTROSE 50 % IV SOLN: 25.0000 mL | Freq: Once | INTRAVENOUS | Status: DC

## 2019-01-02 MED ORDER — INSULIN ASPART 100 UNIT/ML ~~LOC~~ SOLN
0.0000 [IU] | Freq: Three times a day (TID) | SUBCUTANEOUS | Status: DC
  Administered 2019-01-02: 15 [IU] via SUBCUTANEOUS
  Administered 2019-01-02 (×2): 20 [IU] via SUBCUTANEOUS
  Administered 2019-01-03: 7 [IU] via SUBCUTANEOUS
  Administered 2019-01-03 – 2019-01-04 (×2): 11 [IU] via SUBCUTANEOUS

## 2019-01-02 MED ORDER — DEXTROSE 50 % IV SOLN
INTRAVENOUS | Status: AC
  Filled 2019-01-02: qty 50

## 2019-01-02 MED ORDER — ALBUMIN HUMAN 5 % IV SOLN
12.5000 g | Freq: Once | INTRAVENOUS | Status: AC
  Administered 2019-01-02: 12.5 g via INTRAVENOUS
  Filled 2019-01-02: qty 250

## 2019-01-02 MED ORDER — PERFLUTREN LIPID MICROSPHERE
INTRAVENOUS | Status: AC
  Administered 2019-01-02: 12:00:00 2 mL via INTRAVENOUS
  Filled 2019-01-02: qty 10

## 2019-01-02 MED ORDER — OXYCODONE HCL 5 MG PO TABS
10.0000 mg | ORAL_TABLET | ORAL | Status: DC | PRN
  Administered 2019-01-02 – 2019-01-03 (×2): 10 mg via ORAL
  Filled 2019-01-02 (×2): qty 2

## 2019-01-02 MED ORDER — ORAL CARE MOUTH RINSE: 15.0000 mL | Freq: Two times a day (BID) | OROMUCOSAL | Status: DC

## 2019-01-02 MED ORDER — POTASSIUM CHLORIDE IN NACL 20-0.9 MEQ/L-% IV SOLN
INTRAVENOUS | Status: DC
  Administered 2019-01-02: 03:00:00 via INTRAVENOUS
  Filled 2019-01-02 (×2): qty 1000

## 2019-01-02 MED ORDER — LIDOCAINE HCL (PF) 1 % IJ SOLN
INTRAMUSCULAR | Status: AC
  Filled 2019-01-02: qty 5

## 2019-01-02 MED ORDER — MORPHINE SULFATE (PF) 4 MG/ML IV SOLN
4.0000 mg | INTRAVENOUS | Status: DC | PRN
  Administered 2019-01-02 (×4): 4 mg via INTRAVENOUS
  Administered 2019-01-02: 2 mg via INTRAVENOUS
  Administered 2019-01-03: 4 mg via INTRAVENOUS
  Administered 2019-01-03: 2 mg via INTRAVENOUS
  Administered 2019-01-03 – 2019-01-16 (×9): 4 mg via INTRAVENOUS
  Administered 2019-01-18 – 2019-01-19 (×2): 2 mg via INTRAVENOUS
  Filled 2019-01-02 (×20): qty 1

## 2019-01-02 MED ORDER — SODIUM CHLORIDE 0.9 % IV SOLN
INTRAVENOUS | Status: DC
  Administered 2019-01-02 – 2019-01-03 (×3): via INTRAVENOUS

## 2019-01-02 MED ORDER — CHLORHEXIDINE GLUCONATE CLOTH 2 % EX PADS
6.0000 | MEDICATED_PAD | Freq: Every day | CUTANEOUS | Status: DC
  Administered 2019-01-02 – 2019-01-07 (×4): 6 via TOPICAL

## 2019-01-02 MED ORDER — INSULIN ASPART 100 UNIT/ML IV SOLN
10.0000 [IU] | Freq: Once | INTRAVENOUS | Status: AC
  Administered 2019-01-02: 10 [IU] via INTRAVENOUS

## 2019-01-02 MED ORDER — ORAL CARE MOUTH RINSE
15.0000 mL | Freq: Two times a day (BID) | OROMUCOSAL | Status: DC
  Administered 2019-01-02 – 2019-01-04 (×3): 15 mL via OROMUCOSAL
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
## 2019-01-02 NOTE — Progress Notes (Signed)
Called about patient's K of 6.8 on repeat BMET this afternoon.  Will reorder a stat K to confirm.  RN stats no changes in EKG currently.  Cr up to 2.25.  Only made about 300cc of urine since admission.  D/W Dr. Brantley Stage.  Will ask nephrology to evaluate patient given quick change in renal function since admission.  Will start on neo to better perfuse kidneys given patient is normally hypertensive.  I have spoken to Dr. Hollie Salk, Dr. Tamala Julian, and Dr. Brantley Stage to facility care for this patient.  Henreitta Cea 4:15 PM 01/02/2019
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
## 2019-01-02 NOTE — Progress Notes (Signed)
  Echocardiogram 2D Echocardiogram has been performed.  Heather Lyons 01/02/2019, 12:03 PM
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
## 2019-01-02 NOTE — ED Provider Notes (Signed)
CHEST TUBE INSERTION  Date/Time: 01/02/2019 12:40 AM Performed by: Jefm Petty, MD Authorized by: Quintella Reichert, MD   Consent:    Consent obtained:  Verbal   Consent given by:  Patient   Risks discussed:  Bleeding, nerve damage, incomplete drainage, damage to surrounding structures, infection and pain   Alternatives discussed:  No treatment, delayed treatment, alternative treatment and observation Universal protocol:    Procedure explained and questions answered to patient or proxy's satisfaction: yes     Imaging studies available: yes     Required blood products, implants, devices, and special equipment available: yes     Site/side marked: yes     Immediately prior to procedure a time out was called: yes     Patient identity confirmed:  Verbally with patient, arm band, provided demographic data, hospital-assigned identification number and anonymous protocol, patient vented/unresponsive Pre-procedure details:    Skin preparation:  ChloraPrep   Preparation: Patient was prepped and draped in the usual sterile fashion   Sedation:    Sedation type:  Anxiolysis Anesthesia (see MAR for exact dosages):    Anesthesia method:  Local infiltration   Local anesthetic:  Lidocaine 1% w/o epi Procedure details:    Placement location:  L lateral   Tube size (Fr):  16   Ultrasound guidance: no     Tube connected to:  Suction   Drainage characteristics:  Bloody   Suture material:  0 silk   Dressing:  4x4 sterile gauze and petrolatum-impregnated gauze Post-procedure details:    Post-insertion x-ray findings: tube in good position     Patient tolerance of procedure:  Tolerated well, no immediate complications Comments:     Thal-Quick Chest Tube Kit, 16Fr placed into the left chest wall at the mid-axillary line in the area of the 6th rib space. 664m of blood drainage collected within the first 30 minutes.       WJefm Petty MD 01/02/19 [id number]   RQuintella Reichert MD 01/03/19  1224

## 2019-01-02 NOTE — Progress Notes (Signed)
Rehab Admissions Coordinator Note:  Patient was screened by Cleatrice Burke for appropriateness for an Inpatient Acute Rehab Consult per PT recs. Once chest tube out, if pt still requires rehab, then place rehab consult. I will follow.  Cleatrice Burke RN MSN 01/02/2019, 9:01 AM  I can be reached at [phone].

## 2019-01-02 NOTE — Progress Notes (Signed)
   01/02/19 1332  Vitals  Pulse Rate 77  ECG Heart Rate 77  Resp 12  Oxygen Therapy  SpO2 (!) 79 %  O2 Device Non-rebreather Mask  MEWS Score  MEWS RR 1  MEWS Pulse 0  MEWS Systolic 1  MEWS LOC 0  MEWS Temp 0  MEWS Score 2  MEWS Score Color Yellow    After being supine for I&O catheterization, patient with acute desaturation episode. Lowest sats noted to be 73%. Patient still arousable. Lung sounds clear but diminished in the bases. Left chest tube continues to suction and is patent and secure. Non-rebreather mask placed and patient coached on deep breathing. Within minutes, sats improved to 98-100% FiO2. Patient positioned for optimal breathing and comfort. Will retry nasal cannula.

## 2019-01-02 NOTE — Progress Notes (Signed)
Trauma MD notified of pt's low BP and MAP, orders received for 1 albumin. Goal is for MAP above 60. Will continue to monitor pt hemodynamics.  Sherlie Ban, RN

## 2019-01-03 ENCOUNTER — Inpatient Hospital Stay (HOSPITAL_COMMUNITY): Payer: Medicare Other

## 2019-01-03 ENCOUNTER — Encounter (HOSPITAL_COMMUNITY): Admission: EM | Disposition: A | Discharge status: Skilled Nursing Facility | Payer: Self-pay | Source: Home / Self Care

## 2019-01-03 ENCOUNTER — Inpatient Hospital Stay (HOSPITAL_COMMUNITY): Payer: Medicare Other | Admitting: Anesthesiology

## 2019-01-03 ENCOUNTER — Encounter (HOSPITAL_COMMUNITY): Payer: Self-pay | Admitting: *Deleted

## 2019-01-03 DIAGNOSIS — R609 Edema, unspecified: Secondary | ICD-10-CM

## 2019-01-03 DIAGNOSIS — I447 Left bundle-branch block, unspecified: Secondary | ICD-10-CM

## 2019-01-03 DIAGNOSIS — I248 Other forms of acute ischemic heart disease: Secondary | ICD-10-CM

## 2019-01-03 DIAGNOSIS — Z952 Presence of prosthetic heart valve: Secondary | ICD-10-CM

## 2019-01-03 HISTORY — PX: LAPAROTOMY: SHX154

## 2019-01-03 HISTORY — PX: APPLICATION OF WOUND VAC: SHX5189

## 2019-01-03 LAB — COMPREHENSIVE METABOLIC PANEL
ALT: 77 U/L — ABNORMAL HIGH (ref 0–44)
AST: 119 U/L — ABNORMAL HIGH (ref 15–41)
Albumin: 2.4 g/dL — ABNORMAL LOW (ref 3.5–5.0)
Alkaline Phosphatase: 44 U/L (ref 38–126)
Anion gap: 8 (ref 5–15)
BUN: 36 mg/dL — ABNORMAL HIGH (ref 8–23)
CO2: 23 mmol/L (ref 22–32)
Calcium: 7.3 mg/dL — ABNORMAL LOW (ref 8.9–10.3)
Chloride: 105 mmol/L (ref 98–111)
Creatinine, Ser: 1.88 mg/dL — ABNORMAL HIGH (ref 0.44–1.00)
GFR calc Af Amer: 29 mL/min — ABNORMAL LOW (ref >60)
GFR calc non Af Amer: 25 mL/min — ABNORMAL LOW (ref >60)
Glucose, Bld: 280 mg/dL — ABNORMAL HIGH (ref 70–99)
Potassium: 4.7 mmol/L (ref 3.5–5.1)
Sodium: 136 mmol/L (ref 135–145)
Total Bilirubin: 0.7 mg/dL (ref 0.3–1.2)
Total Protein: 5.2 g/dL — ABNORMAL LOW (ref 6.5–8.1)

## 2019-01-03 LAB — POCT I-STAT 7, (LYTES, BLD GAS, ICA,H+H)
Acid-base deficit: 5 mmol/L — ABNORMAL HIGH (ref 0.0–2.0)
Acid-base deficit: 2 mmol/L (ref 0.0–2.0)
Acid-base deficit: 4 mmol/L — ABNORMAL HIGH (ref 0.0–2.0)
Acid-base deficit: 2 mmol/L (ref 0.0–2.0)
Bicarbonate: 22.2 mmol/L (ref 20.0–28.0)
Bicarbonate: 25.2 mmol/L (ref 20.0–28.0)
Bicarbonate: 22.8 mmol/L (ref 20.0–28.0)
Bicarbonate: 23.7 mmol/L (ref 20.0–28.0)
Calcium, Ion: 1.1 mmol/L — ABNORMAL LOW (ref 1.15–1.40)
Calcium, Ion: 1.1 mmol/L — ABNORMAL LOW (ref 1.15–1.40)
Calcium, Ion: 1.08 mmol/L — ABNORMAL LOW (ref 1.15–1.40)
Calcium, Ion: 1.09 mmol/L — ABNORMAL LOW (ref 1.15–1.40)
HCT: 22 % — ABNORMAL LOW (ref 36.0–46.0)
HCT: 23 % — ABNORMAL LOW (ref 36.0–46.0)
HCT: 24 % — ABNORMAL LOW (ref 36.0–46.0)
HCT: 25 % — ABNORMAL LOW (ref 36.0–46.0)
Hemoglobin: 7.5 g/dL — ABNORMAL LOW (ref 12.0–15.0)
Hemoglobin: 7.8 g/dL — ABNORMAL LOW (ref 12.0–15.0)
Hemoglobin: 8.2 g/dL — ABNORMAL LOW (ref 12.0–15.0)
Hemoglobin: 8.5 g/dL — ABNORMAL LOW (ref 12.0–15.0)
O2 Saturation: 96 %
O2 Saturation: 100 %
O2 Saturation: 95 %
O2 Saturation: 92 %
Patient temperature: 36.3
Patient temperature: 98.2
Potassium: 4.8 mmol/L (ref 3.5–5.1)
Potassium: 4.8 mmol/L (ref 3.5–5.1)
Potassium: 4.6 mmol/L (ref 3.5–5.1)
Potassium: 4.6 mmol/L (ref 3.5–5.1)
Sodium: 136 mmol/L (ref 135–145)
Sodium: 137 mmol/L (ref 135–145)
Sodium: 137 mmol/L (ref 135–145)
Sodium: 135 mmol/L (ref 135–145)
TCO2: 24 mmol/L (ref 22–32)
TCO2: 27 mmol/L (ref 22–32)
TCO2: 24 mmol/L (ref 22–32)
TCO2: 25 mmol/L (ref 22–32)
pCO2 arterial: 49.3 mmHg — ABNORMAL HIGH (ref 32.0–48.0)
pCO2 arterial: 56.1 mmHg — ABNORMAL HIGH (ref 32.0–48.0)
pCO2 arterial: 48 mmHg (ref 32.0–48.0)
pCO2 arterial: 42.6 mmHg (ref 32.0–48.0)
pH, Arterial: 7.258 — ABNORMAL LOW (ref 7.350–7.450)
pH, Arterial: 7.261 — ABNORMAL LOW (ref 7.350–7.450)
pH, Arterial: 7.283 — ABNORMAL LOW (ref 7.350–7.450)
pH, Arterial: 7.354 (ref 7.350–7.450)
pO2, Arterial: 90 mmHg (ref 83.0–108.0)
pO2, Arterial: 270 mmHg — ABNORMAL HIGH (ref 83.0–108.0)
pO2, Arterial: 84 mmHg (ref 83.0–108.0)
pO2, Arterial: 68 mmHg — ABNORMAL LOW (ref 83.0–108.0)

## 2019-01-03 LAB — BASIC METABOLIC PANEL
Anion gap: 13 (ref 5–15)
Anion gap: 10 (ref 5–15)
Anion gap: 8 (ref 5–15)
BUN: 31 mg/dL — ABNORMAL HIGH (ref 8–23)
BUN: 37 mg/dL — ABNORMAL HIGH (ref 8–23)
BUN: 39 mg/dL — ABNORMAL HIGH (ref 8–23)
CO2: 21 mmol/L — ABNORMAL LOW (ref 22–32)
CO2: 22 mmol/L (ref 22–32)
CO2: 24 mmol/L (ref 22–32)
Calcium: 7.9 mg/dL — ABNORMAL LOW (ref 8.9–10.3)
Calcium: 7.3 mg/dL — ABNORMAL LOW (ref 8.9–10.3)
Calcium: 7.3 mg/dL — ABNORMAL LOW (ref 8.9–10.3)
Chloride: 101 mmol/L (ref 98–111)
Chloride: 105 mmol/L (ref 98–111)
Chloride: 103 mmol/L (ref 98–111)
Creatinine, Ser: 2.4 mg/dL — ABNORMAL HIGH (ref 0.44–1.00)
Creatinine, Ser: 1.65 mg/dL — ABNORMAL HIGH (ref 0.44–1.00)
Creatinine, Ser: 1.69 mg/dL — ABNORMAL HIGH (ref 0.44–1.00)
GFR calc Af Amer: 21 mL/min — ABNORMAL LOW (ref >60)
GFR calc Af Amer: 33 mL/min — ABNORMAL LOW (ref >60)
GFR calc Af Amer: 32 mL/min — ABNORMAL LOW (ref >60)
GFR calc non Af Amer: 18 mL/min — ABNORMAL LOW (ref >60)
GFR calc non Af Amer: 29 mL/min — ABNORMAL LOW (ref >60)
GFR calc non Af Amer: 28 mL/min — ABNORMAL LOW (ref >60)
Glucose, Bld: 179 mg/dL — ABNORMAL HIGH (ref 70–99)
Glucose, Bld: 283 mg/dL — ABNORMAL HIGH (ref 70–99)
Glucose, Bld: 336 mg/dL — ABNORMAL HIGH (ref 70–99)
Potassium: 5.5 mmol/L — ABNORMAL HIGH (ref 3.5–5.1)
Potassium: 4.7 mmol/L (ref 3.5–5.1)
Potassium: 4.7 mmol/L (ref 3.5–5.1)
Sodium: 135 mmol/L (ref 135–145)
Sodium: 137 mmol/L (ref 135–145)
Sodium: 135 mmol/L (ref 135–145)

## 2019-01-03 LAB — CBC
HCT: 23.5 % — ABNORMAL LOW (ref 36.0–46.0)
HCT: 27.5 % — ABNORMAL LOW (ref 36.0–46.0)
Hemoglobin: 7.7 g/dL — ABNORMAL LOW (ref 12.0–15.0)
Hemoglobin: 9.1 g/dL — ABNORMAL LOW (ref 12.0–15.0)
MCH: 28.2 pg (ref 26.0–34.0)
MCH: 29.1 pg (ref 26.0–34.0)
MCHC: 32.8 g/dL (ref 30.0–36.0)
MCHC: 33.1 g/dL (ref 30.0–36.0)
MCV: 86.1 fL (ref 80.0–100.0)
MCV: 87.9 fL (ref 80.0–100.0)
Platelets: 148 10*3/uL — ABNORMAL LOW (ref 150–400)
Platelets: 144 10*3/uL — ABNORMAL LOW (ref 150–400)
RBC: 2.73 MIL/uL — ABNORMAL LOW (ref 3.87–5.11)
RBC: 3.13 MIL/uL — ABNORMAL LOW (ref 3.87–5.11)
RDW: 13.9 % (ref 11.5–15.5)
RDW: 14.1 % (ref 11.5–15.5)
WBC: 10.1 10*3/uL (ref 4.0–10.5)
WBC: 9.9 10*3/uL (ref 4.0–10.5)
nRBC: 0 % (ref 0.0–0.2)
nRBC: 0.2 % (ref 0.0–0.2)

## 2019-01-03 LAB — URINALYSIS, ROUTINE W REFLEX MICROSCOPIC
Bacteria, UA: NONE SEEN
Bilirubin Urine: NEGATIVE
Glucose, UA: NEGATIVE mg/dL
Ketones, ur: NEGATIVE mg/dL
Leukocytes,Ua: NEGATIVE
Nitrite: NEGATIVE
Protein, ur: 30 mg/dL — AB
Specific Gravity, Urine: 1.036 — ABNORMAL HIGH (ref 1.005–1.030)
pH: 5 (ref 5.0–8.0)

## 2019-01-03 LAB — PROTIME-INR
INR: 1.5 — ABNORMAL HIGH (ref 0.8–1.2)
Prothrombin Time: 17.6 seconds — ABNORMAL HIGH (ref 11.4–15.2)

## 2019-01-03 LAB — GLUCOSE, CAPILLARY
Glucose-Capillary: 276 mg/dL — ABNORMAL HIGH (ref 70–99)
Glucose-Capillary: 241 mg/dL — ABNORMAL HIGH (ref 70–99)
Glucose-Capillary: 282 mg/dL — ABNORMAL HIGH (ref 70–99)
Glucose-Capillary: 307 mg/dL — ABNORMAL HIGH (ref 70–99)

## 2019-01-03 LAB — ABO/RH: ABO/RH(D): O POS

## 2019-01-03 LAB — PREPARE RBC (CROSSMATCH)

## 2019-01-03 LAB — LACTIC ACID, PLASMA
Lactic Acid, Venous: 2.7 mmol/L (ref 0.5–1.9)
Lactic Acid, Venous: 2.5 mmol/L (ref 0.5–1.9)

## 2019-01-03 LAB — TROPONIN I (HIGH SENSITIVITY): Troponin I (High Sensitivity): 393 ng/L (ref <18)

## 2019-01-03 SURGERY — LAPAROTOMY, EXPLORATORY
Anesthesia: General | Site: Abdomen

## 2019-01-03 MED ORDER — SODIUM CHLORIDE 0.9 % IV SOLN
INTRAVENOUS | Status: DC | PRN
  Administered 2019-01-03: 40 ug/min via INTRAVENOUS

## 2019-01-03 MED ORDER — FENTANYL CITRATE (PF) 250 MCG/5ML IJ SOLN
INTRAMUSCULAR | Status: AC
  Filled 2019-01-03: qty 5

## 2019-01-03 MED ORDER — INSULIN DETEMIR 100 UNIT/ML ~~LOC~~ SOLN
20.0000 [IU] | Freq: Every day | SUBCUTANEOUS | Status: DC
  Administered 2019-01-03: 20 [IU] via SUBCUTANEOUS
  Filled 2019-01-03 (×2): qty 0.2

## 2019-01-03 MED ORDER — PROPOFOL 1000 MG/100ML IV EMUL
0.0000 ug/kg/min | INTRAVENOUS | Status: DC
  Administered 2019-01-03: 25 ug/kg/min via INTRAVENOUS
  Administered 2019-01-03: 10 ug/kg/min via INTRAVENOUS
  Administered 2019-01-04 (×2): 15 ug/kg/min via INTRAVENOUS
  Administered 2019-01-05 – 2019-01-07 (×5): 10 ug/kg/min via INTRAVENOUS
  Administered 2019-01-08: 15 ug/kg/min via INTRAVENOUS
  Administered 2019-01-08 – 2019-01-10 (×4): 10 ug/kg/min via INTRAVENOUS
  Filled 2019-01-03 (×10): qty 100
  Filled 2019-01-03: qty 200
  Filled 2019-01-03 (×2): qty 100

## 2019-01-03 MED ORDER — INSULIN DETEMIR 100 UNIT/ML ~~LOC~~ SOLN
45.0000 [IU] | Freq: Every day | SUBCUTANEOUS | Status: DC
  Filled 2019-01-03: qty 0.45

## 2019-01-03 MED ORDER — LIDOCAINE 2% (20 MG/ML) 5 ML SYRINGE
INTRAMUSCULAR | Status: AC
  Filled 2019-01-03: qty 5

## 2019-01-03 MED ORDER — SUCCINYLCHOLINE CHLORIDE 20 MG/ML IJ SOLN
INTRAMUSCULAR | Status: DC | PRN
  Administered 2019-01-03: 100 mg via INTRAVENOUS

## 2019-01-03 MED ORDER — SODIUM BICARBONATE 8.4 % IV SOLN
INTRAVENOUS | Status: DC
  Administered 2019-01-03 – 2019-01-04 (×3): via INTRAVENOUS
  Filled 2019-01-03 (×5): qty 150

## 2019-01-03 MED ORDER — ORAL CARE MOUTH RINSE
15.0000 mL | OROMUCOSAL | Status: DC
  Administered 2019-01-03 – 2019-01-19 (×159): 15 mL via OROMUCOSAL

## 2019-01-03 MED ORDER — FENTANYL 2500MCG IN NS 250ML (10MCG/ML) PREMIX INFUSION
25.0000 ug/h | INTRAVENOUS | Status: DC
  Administered 2019-01-03: 13:00:00 25 ug/h via INTRAVENOUS
  Administered 2019-01-04: 18:00:00 75 ug/h via INTRAVENOUS
  Administered 2019-01-06 – 2019-01-09 (×4): 100 ug/h via INTRAVENOUS
  Administered 2019-01-10: 150 ug/h via INTRAVENOUS
  Administered 2019-01-10: 175 ug/h via INTRAVENOUS
  Administered 2019-01-11 (×2): 200 ug/h via INTRAVENOUS
  Filled 2019-01-03 (×10): qty 250

## 2019-01-03 MED ORDER — INSULIN DETEMIR 100 UNIT/ML ~~LOC~~ SOLN
30.0000 [IU] | Freq: Every day | SUBCUTANEOUS | Status: DC
  Filled 2019-01-03: qty 0.3

## 2019-01-03 MED ORDER — SODIUM CHLORIDE 0.9 % IV SOLN
1.0000 g | INTRAVENOUS | Status: AC
  Administered 2019-01-03: 1 g via INTRAVENOUS
  Filled 2019-01-03: qty 1

## 2019-01-03 MED ORDER — FENTANYL CITRATE (PF) 250 MCG/5ML IJ SOLN
INTRAMUSCULAR | Status: DC | PRN
  Administered 2019-01-03: 50 ug via INTRAVENOUS
  Administered 2019-01-03 (×2): 100 ug via INTRAVENOUS

## 2019-01-03 MED ORDER — DEXMEDETOMIDINE HCL IN NACL 400 MCG/100ML IV SOLN
0.4000 ug/kg/h | INTRAVENOUS | Status: DC
  Administered 2019-01-03: 0.7 ug/kg/h via INTRAVENOUS
  Filled 2019-01-03: qty 100

## 2019-01-03 MED ORDER — 0.9 % SODIUM CHLORIDE (POUR BTL) OPTIME
TOPICAL | Status: DC | PRN
  Administered 2019-01-03 (×2): 1000 mL

## 2019-01-03 MED ORDER — ETOMIDATE 2 MG/ML IV SOLN
INTRAVENOUS | Status: DC | PRN
  Administered 2019-01-03: 10 mg via INTRAVENOUS

## 2019-01-03 MED ORDER — SODIUM CHLORIDE 0.9 % IV SOLN
10.0000 mL/h | Freq: Once | INTRAVENOUS | Status: AC
  Administered 2019-01-04: 10 mL/h via INTRAVENOUS

## 2019-01-03 MED ORDER — ETOMIDATE 2 MG/ML IV SOLN
INTRAVENOUS | Status: AC
  Filled 2019-01-03: qty 10

## 2019-01-03 MED ORDER — NOREPINEPHRINE 4 MG/250ML-% IV SOLN
INTRAVENOUS | Status: AC
  Administered 2019-01-04: 22 ug/min via INTRAVENOUS
  Filled 2019-01-03: qty 250

## 2019-01-03 MED ORDER — NOREPINEPHRINE 4 MG/250ML-% IV SOLN
0.0000 ug/min | INTRAVENOUS | Status: DC
  Administered 2019-01-03: 23:00:00 10 ug/min via INTRAVENOUS
  Administered 2019-01-04 (×2): 22 ug/min via INTRAVENOUS
  Administered 2019-01-04: 15 ug/min via INTRAVENOUS
  Administered 2019-01-04: 22 ug/min via INTRAVENOUS
  Administered 2019-01-04: 25 ug/min via INTRAVENOUS
  Administered 2019-01-04: 03:00:00 22 ug/min via INTRAVENOUS
  Administered 2019-01-04: 15:00:00 20 ug/min via INTRAVENOUS
  Administered 2019-01-04: 09:00:00 22 ug/min via INTRAVENOUS
  Administered 2019-01-05: 04:00:00 12 ug/min via INTRAVENOUS
  Filled 2019-01-03: qty 250
  Filled 2019-01-03: qty 500
  Filled 2019-01-03 (×2): qty 250
  Filled 2019-01-03: qty 500
  Filled 2019-01-03 (×3): qty 250

## 2019-01-03 MED ORDER — ROCURONIUM BROMIDE 10 MG/ML (PF) SYRINGE
PREFILLED_SYRINGE | INTRAVENOUS | Status: DC | PRN
  Administered 2019-01-03 (×2): 50 mg via INTRAVENOUS

## 2019-01-03 MED ORDER — LIDOCAINE 2% (20 MG/ML) 5 ML SYRINGE
INTRAMUSCULAR | Status: DC | PRN
  Administered 2019-01-03: 60 mg via INTRAVENOUS

## 2019-01-03 MED ORDER — FENTANYL BOLUS VIA INFUSION
25.0000 ug | INTRAVENOUS | Status: DC | PRN
  Filled 2019-01-03: qty 25

## 2019-01-03 MED ORDER — LACTATED RINGERS IV SOLN
INTRAVENOUS | Status: DC
  Administered 2019-01-03: 10:00:00 via INTRAVENOUS

## 2019-01-03 MED ORDER — PHENYLEPHRINE HCL-NACL 40-0.9 MG/250ML-% IV SOLN
0.0000 ug/min | INTRAVENOUS | Status: DC
  Administered 2019-01-03: 300 ug/min via INTRAVENOUS
  Administered 2019-01-04: 380 ug/min via INTRAVENOUS
  Administered 2019-01-04: 18:00:00 350 ug/min via INTRAVENOUS
  Administered 2019-01-04 (×2): 380 ug/min via INTRAVENOUS
  Administered 2019-01-04 (×2): 350 ug/min via INTRAVENOUS
  Administered 2019-01-04 (×2): 380 ug/min via INTRAVENOUS
  Administered 2019-01-04: 360 ug/min via INTRAVENOUS
  Administered 2019-01-04 (×2): 380 ug/min via INTRAVENOUS
  Administered 2019-01-04 (×2): 350 ug/min via INTRAVENOUS
  Administered 2019-01-05: 310 ug/min via INTRAVENOUS
  Administered 2019-01-05: 03:00:00 350 ug/min via INTRAVENOUS
  Administered 2019-01-05: 400 ug/min via INTRAVENOUS
  Administered 2019-01-05: 325 ug/min via INTRAVENOUS
  Administered 2019-01-05: 310 ug/min via INTRAVENOUS
  Administered 2019-01-05 (×2): 325 ug/min via INTRAVENOUS
  Administered 2019-01-05: 400 ug/min via INTRAVENOUS
  Administered 2019-01-05 – 2019-01-06 (×2): 300 ug/min via INTRAVENOUS
  Administered 2019-01-06: 240 ug/min via INTRAVENOUS
  Administered 2019-01-06: 07:00:00 300 ug/min via INTRAVENOUS
  Administered 2019-01-06: 130 ug/min via INTRAVENOUS
  Administered 2019-01-06: 10:00:00 290 ug/min via INTRAVENOUS
  Administered 2019-01-06: 270 ug/min via INTRAVENOUS
  Administered 2019-01-06: 310 ug/min via INTRAVENOUS
  Administered 2019-01-07: 125 ug/min via INTRAVENOUS
  Administered 2019-01-07: 60 ug/min via INTRAVENOUS
  Administered 2019-01-08: 20 ug/min via INTRAVENOUS
  Filled 2019-01-03 (×26): qty 250
  Filled 2019-01-03: qty 750
  Filled 2019-01-03 (×5): qty 250

## 2019-01-03 MED ORDER — CHLORHEXIDINE GLUCONATE 0.12% ORAL RINSE (MEDLINE KIT)
15.0000 mL | Freq: Two times a day (BID) | OROMUCOSAL | Status: DC
  Administered 2019-01-04 – 2019-01-19 (×31): 15 mL via OROMUCOSAL

## 2019-01-03 MED ORDER — FENTANYL CITRATE (PF) 100 MCG/2ML IJ SOLN: 25.0000 ug | Freq: Once | INTRAMUSCULAR | Status: DC

## 2019-01-03 SURGICAL SUPPLY — 47 items
BENZOIN TINCTURE PRP APPL 2/3 (GAUZE/BANDAGES/DRESSINGS) ×4 IMPLANT
BLADE CLIPPER SURG (BLADE) IMPLANT
CANISTER SUCT 3000ML PPV (MISCELLANEOUS) ×3 IMPLANT
CANISTER WOUNDNEG PRESSURE 500 (CANNISTER) ×2 IMPLANT
CHLORAPREP W/TINT 26 (MISCELLANEOUS) ×3 IMPLANT
COVER SURGICAL LIGHT HANDLE (MISCELLANEOUS) ×3 IMPLANT
COVER WAND RF STERILE (DRAPES) ×1 IMPLANT
DRAPE LAPAROSCOPIC ABDOMINAL (DRAPES) ×3 IMPLANT
DRAPE WARM FLUID 44X44 (DRAPES) ×3 IMPLANT
DRSG OPSITE POSTOP 4X10 (GAUZE/BANDAGES/DRESSINGS) IMPLANT
DRSG OPSITE POSTOP 4X8 (GAUZE/BANDAGES/DRESSINGS) IMPLANT
ELECT BLADE 6.5 EXT (BLADE) ×2 IMPLANT
ELECT CAUTERY BLADE 6.4 (BLADE) ×3 IMPLANT
ELECT REM PT RETURN 9FT ADLT (ELECTROSURGICAL) ×3
ELECTRODE REM PT RTRN 9FT ADLT (ELECTROSURGICAL) ×1 IMPLANT
GLOVE BIO SURGEON STRL SZ8 (GLOVE) ×3 IMPLANT
GLOVE BIOGEL PI IND STRL 8 (GLOVE) ×1 IMPLANT
GLOVE BIOGEL PI INDICATOR 8 (GLOVE) ×2
GOWN STRL REUS W/ TWL LRG LVL3 (GOWN DISPOSABLE) ×1 IMPLANT
GOWN STRL REUS W/ TWL XL LVL3 (GOWN DISPOSABLE) ×1 IMPLANT
GOWN STRL REUS W/TWL LRG LVL3 (GOWN DISPOSABLE) ×4
GOWN STRL REUS W/TWL XL LVL3 (GOWN DISPOSABLE) ×2
HANDLE SUCTION POOLE (INSTRUMENTS) ×1 IMPLANT
KIT BASIN OR (CUSTOM PROCEDURE TRAY) ×3 IMPLANT
KIT TURNOVER KIT B (KITS) ×3 IMPLANT
LIGASURE IMPACT 36 18CM CVD LR (INSTRUMENTS) ×2 IMPLANT
NS IRRIG 1000ML POUR BTL (IV SOLUTION) ×6 IMPLANT
PACK GENERAL/GYN (CUSTOM PROCEDURE TRAY) ×3 IMPLANT
PAD ARMBOARD 7.5X6 YLW CONV (MISCELLANEOUS) ×3 IMPLANT
PENCIL SMOKE EVACUATOR (MISCELLANEOUS) ×3 IMPLANT
RELOAD PROXIMATE 75MM BLUE (ENDOMECHANICALS) ×3 IMPLANT
RELOAD STAPLE 75 3.8 BLU REG (ENDOMECHANICALS) IMPLANT
SLEEVE SUCTION CATH 165 (SLEEVE) ×2 IMPLANT
SPECIMEN JAR LARGE (MISCELLANEOUS) ×2 IMPLANT
SPONGE ABD ABTHERA ADVANCE (MISCELLANEOUS) ×2 IMPLANT
SPONGE LAP 18X18 RF (DISPOSABLE) IMPLANT
STAPLER PROXIMATE 75MM BLUE (STAPLE) ×2 IMPLANT
STAPLER VISISTAT 35W (STAPLE) ×3 IMPLANT
SUCTION POOLE HANDLE (INSTRUMENTS) ×3
SUT PDS AB 1 TP1 96 (SUTURE) ×6 IMPLANT
SUT SILK 2 0 SH CR/8 (SUTURE) ×3 IMPLANT
SUT SILK 2 0 TIES 10X30 (SUTURE) ×3 IMPLANT
SUT SILK 3 0 SH CR/8 (SUTURE) ×3 IMPLANT
SUT SILK 3 0 TIES 10X30 (SUTURE) ×3 IMPLANT
TOWEL GREEN STERILE (TOWEL DISPOSABLE) ×3 IMPLANT
TRAY FOLEY MTR SLVR 16FR STAT (SET/KITS/TRAYS/PACK) IMPLANT
YANKAUER SUCT BULB TIP NO VENT (SUCTIONS) IMPLANT

## 2019-01-03 NOTE — Progress Notes (Signed)
After increasing Neo to max dosage and pausing Propofol in attempt to bring pt BP within parameters without success, called and received new orders from on call Dr. Kieth Brightly. Will continue to monitor closely.

## 2019-01-03 NOTE — Progress Notes (Signed)
Late Entry  Patient to short stay/pre op. CHG bathed prior to transfer. Telephone consent obtained from patient's daughter Jacqlyn Larsen, with Dr. Grandville Silos. Patient was able to speak to her daughter before transferring. RN updated daughter. Several attempts made to patient's son Laverna Peace, but was not able to reach him. Jacqlyn Larsen stated she would update him.  Joellen Jersey, RN
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
## 2019-01-03 NOTE — Progress Notes (Signed)
Patient ID: Heather Lyons, female   DOB: Jul 10, 1934, 84 y.o.   MRN: [id number] CT scan was reviewed with the radiologist.  It demonstrates a large amount of small bowel mesenteric air consistent with severe ischemic bowel. I spoke with her about this and offered exploratory laparotomy with bowel resection and likely leaving her abdomen open.  Should she survive and improve, we will plan further surgery later this week.  I discussed the procedure, risks, and benefits with her.  She has a very high mortality at this point.  I also spoke with her daughter on the phone.  Best case scenario will likely be returning to the ICU on a ventilator with an open abdomen.  She would likely require CRRT.  I also discussed this with Dr. Hollie Salk.  The patient is willing to undergo this.  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  Her daughter was able to give consent over the phone.  We will proceed emergently.  Georganna Skeans, MD, MPH, FACS Trauma & General Surgery: [phone]

## 2019-01-03 NOTE — Anesthesia Procedure Notes (Signed)
Arterial Line Insertion Start/End7/01/2019 10:00 AM, 01/03/2019 10:01 AM Performed by: Wilburn Cornelia, CRNA, CRNA  Patient location: Pre-op. Preanesthetic checklist: patient identified, IV checked, site marked, risks and benefits discussed, surgical consent, monitors and equipment checked, pre-op evaluation, timeout performed and anesthesia consent Lidocaine 1% used for infiltration Left, radial was placed Catheter size: 20 G Hand hygiene performed  and maximum sterile barriers used   Attempts: 1 Procedure performed without using ultrasound guided technique. Following insertion, dressing applied and Biopatch. Post procedure assessment: normal  Patient tolerated the procedure well with no immediate complications.
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
## 2019-01-03 NOTE — Anesthesia Procedure Notes (Addendum)
Central Venous Catheter Insertion Performed by: Suzette Battiest, MD, anesthesiologist Start/End7/01/2019 9:30 AM, 01/03/2019 9:45 AM Patient location: Pre-op. Preanesthetic checklist: patient identified, IV checked, site marked, risks and benefits discussed, surgical consent, monitors and equipment checked, pre-op evaluation, timeout performed and anesthesia consent Position: Trendelenburg Lidocaine 1% used for infiltration and patient sedated Hand hygiene performed , maximum sterile barriers used  and Seldinger technique used Catheter size: 8 Fr Total catheter length 16. Central line was placed.Double lumen Procedure performed using ultrasound guided technique. Ultrasound Notes:anatomy identified, needle tip was noted to be adjacent to the nerve/plexus identified, no ultrasound evidence of intravascular and/or intraneural injection and image(s) printed for medical record Attempts: 4 Following insertion, dressing applied, line sutured and Biopatch. Post procedure assessment: blood return through all ports  Patient tolerated the procedure well with no immediate complications.

## 2019-01-03 NOTE — Transfer of Care (Signed)
Immediate Anesthesia Transfer of Care Note  Patient: Heather Lyons  Procedure(s) Performed: EXPLORATORY LAPAROTOMY WITH RESECTION OF DISTAL ILEUM AND CECUM (N/A Abdomen)  Patient Location: ICU  Anesthesia Type:General  Level of Consciousness: sedated and unresponsive  Airway & Oxygen Therapy: Patient remains intubated per anesthesia plan and Patient placed on Ventilator (see vital sign flow sheet for setting)  Post-op Assessment: Report given to RN and Post -op Vital signs reviewed and stable  Post vital signs: Reviewed and stable  Last Vitals:  Vitals Value Taken Time  BP 123/41 01/03/19 1200  Temp    Pulse 94 01/03/19 1216  Resp 18 01/03/19 1216  SpO2 100 % 01/03/19 1216  Vitals shown include unvalidated device data.  Last Pain:  Vitals:   01/03/19 1215  TempSrc: (P) Axillary  PainSc:          Complications: No apparent anesthesia complications

## 2019-01-03 NOTE — Op Note (Signed)
01/01/2019 - 01/03/2019  11:35 AM  PATIENT:  Heather Lyons  84 y.o. female  PRE-OPERATIVE DIAGNOSIS:  Bowel necrosis  POST-OPERATIVE DIAGNOSIS: necrotic distal ileum, dusky cecum, nodular liver consistent with cirrhosis  PROCEDURE:  Procedure(s): EXPLORATORY LAPAROTOMY SMALL BOWEL RESECTION WITH ILEOCECECTOMY  SURGEON:  Georganna Skeans, MD  ASSISTANTS: Romana Juniper, MD   ANESTHESIA:   general  EBL:  Total I/O In: -  Out: 185 [Urine:135; Blood:50]  BLOOD ADMINISTERED:1u CC PRBC  DRAINS: ABTHERA   SPECIMEN:  Excision  DISPOSITION OF SPECIMEN:  PATHOLOGY  COUNTS:  YES  DICTATION: .Dragon Dictation Findings: Necrotic distal ileum without perforation, dusky cecum, no evidence of acute injuries, liver was nodular and appeared cirrhotic  Procedure in detail: Heather Lyons is brought emergently to the operating room for exploratory laparotomy and bowel resection for necrotic bowel.  Informed consent was obtained.  She was brought directly from the intensive care unit to the preop holding area.  After evaluation anesthesia she was brought to the operating room and general endotracheal anesthesia was administered.  Anesthesia placed arterial line and central line.  Her abdomen was prepped and draped in sterile fashion.  We did a timeout procedure.  Midline incision was made and subcutaneous tissues were dissected down revealing the anterior fascia.  This was divided sharply along the midline and her peritoneal cavity was opened.  The fascia was opened to the length of the incision.  There was minimal peritoneal fluid.  We explored the abdomen and found the distal portion of the ileum was necrotic without any perforations.  The cecum was dusky.  The remainder of the right colon looked better perfused.  The transverse, left, and sigmoid colon were ok.  The small bowel was run back from the area of necrosis to the ligament of Treitz and there were no other ischemic or necrotic areas.  The small  bowel was divided at the distal portion of viability.  This was proximal to the necrotic area.  We used a GIA-75 stapler.  Next, the cecum was reevaluated and decision was made to do a cecectomy.  The right colon was divided with GIA-75 stapler above the cecum and a more viable looking area.  The mesentery was then taken down with the LigaSure.  I also used some 2-0 silk suture ligatures to get good hemostasis along the mesentery.  The stomach was inspected and the nasogastric tube was repositioned.  The liver appeared nodular and cirrhotic.  The abdomen was copiously irrigated.  Hemostasis was ensured.  Decision was made to place an AB Thera open abdomen VAC device.  The inner drape was tucked around all of the bowel well and then 2 blue sponges were fashioned and placed on top.  VAC drape was applied and it was hooked up to the VAC suction.  There was excellent seal.  All counts were correct.  She tolerated procedure well without apparent complication.  She was taken directly to the intensive care unit on the ventilator in critical condition.  I spoke with her daughter on the phone at the completion of the procedure and updated her. PATIENT DISPOSITION:  ICU - intubated and critically ill.   Delay start of Pharmacological VTE agent (>24hrs) due to surgical blood loss or risk of bleeding:  no  Georganna Skeans, MD, MPH, FACS Pager: [phone]  7/8/202011:35 AM

## 2019-01-03 NOTE — Progress Notes (Signed)
Late Entry  Patient transported to CT urgently. During transfer from bed to stretcher, patient's chest tube canister was tilted and all the output was dispersed through multiple chambers. Drainage marked with pink tape on his column.  Joellen Jersey, RN

## 2019-01-03 NOTE — Progress Notes (Signed)
CSW unable to complete SBIRT at this time- patient currently intubated/ not oriented. Will follow up at another time once more appropriate.   Kessler Solly, LCSW Transitions of Care Department System Wide Float  [phone]  
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
## 2019-01-03 NOTE — Progress Notes (Signed)
RUE venous duplex       has been completed. Preliminary results can be found under CV proc through chart review. Sandy Blouch, BS, RDMS, RVT   

## 2019-01-03 NOTE — Progress Notes (Signed)
Orthopedic Tech Progress Note Patient Details:  Heather Lyons 04-04-1935 [id number]  Ortho Devices Type of Ortho Device: Ace wrap, Short arm splint Ortho Device/Splint Location: RUE Ortho Device/Splint Interventions: Ordered, Application   Post Interventions Patient Tolerated: Well Instructions Provided: Care of device Applied volar splint and thumb spica splint to RUE  Braulio Bosch 01/03/2019, 2:51 PM

## 2019-01-03 NOTE — Anesthesia Procedure Notes (Signed)
Procedure Name: Intubation Date/Time: 01/03/2019 10:21 AM Performed by: Mariea Clonts, CRNA Pre-anesthesia Checklist: Patient identified, Emergency Drugs available, Suction available and Patient being monitored Patient Re-evaluated:Patient Re-evaluated prior to induction Oxygen Delivery Method: Circle System Utilized Preoxygenation: Pre-oxygenation with 100% oxygen Induction Type: IV induction, Cricoid Pressure applied and Rapid sequence Laryngoscope Size: Miller and 2 Grade View: Grade I Tube type: Oral Tube size: 7.5 mm Number of attempts: 1 Airway Equipment and Method: Stylet and Oral airway Placement Confirmation: ETT inserted through vocal cords under direct vision,  positive ETCO2 and breath sounds checked- equal and bilateral Secured at: 22 cm Tube secured with: Tape Dental Injury: Teeth and Oropharynx as per pre-operative assessment

## 2019-01-03 NOTE — Anesthesia Preprocedure Evaluation (Signed)
Anesthesia Evaluation  Patient identified by MRN, date of birth, ID band Patient awake    Reviewed: Allergy & Precautions, NPO status , Patient's Chart, lab work & pertinent test results  Airway Mallampati: II  TM Distance: >3 FB     Dental  (+) Dental Advisory Given   Pulmonary  Fractured ribs with hemothorax and chest tube    breath sounds clear to auscultation       Cardiovascular + CAD   Rhythm:Regular Rate:Normal     Neuro/Psych negative neurological ROS     GI/Hepatic negative GI ROS, Neg liver ROS, Bowel necrosis    Endo/Other  diabetes, Insulin Dependent  Renal/GU ARFRenal disease     Musculoskeletal   Abdominal   Peds  Hematology  (+) anemia ,   Anesthesia Other Findings   Reproductive/Obstetrics                             Lab Results  Component Value Date   WBC 17.1 (H) 01/02/2019   HGB 10.0 (L) 01/02/2019   HCT 31.2 (L) 01/02/2019   MCV 88.1 01/02/2019   PLT 186 01/02/2019   Lab Results  Component Value Date   CREATININE 2.40 (H) 01/03/2019   BUN 31 (H) 01/03/2019   NA 135 01/03/2019   K 5.5 (H) 01/03/2019   CL 101 01/03/2019   CO2 21 (L) 01/03/2019    Anesthesia Physical Anesthesia Plan  ASA: IV and emergent  Anesthesia Plan: General   Post-op Pain Management:    Induction: Intravenous and Rapid sequence  PONV Risk Score and Plan: 3 and Dexamethasone, Ondansetron and Treatment may vary due to age or medical condition  Airway Management Planned: Oral ETT  Additional Equipment: Arterial line and CVP  Intra-op Plan:   Post-operative Plan: Post-operative intubation/ventilation  Informed Consent: I have reviewed the patients History and Physical, chart, labs and discussed the procedure including the risks, benefits and alternatives for the proposed anesthesia with the patient or authorized representative who has indicated his/her understanding and  acceptance.     Dental advisory given  Plan Discussed with: CRNA  Anesthesia Plan Comments:         Anesthesia Quick Evaluation

## 2019-01-03 NOTE — Anesthesia Postprocedure Evaluation (Signed)
Anesthesia Post Note  Patient: Heather Lyons  Procedure(s) Performed: EXPLORATORY LAPAROTOMY WITH RESECTION OF DISTAL ILEUM AND CECUM (N/A Abdomen)     Patient location during evaluation: SICU Anesthesia Type: General Level of consciousness: sedated Pain management: pain level controlled Vital Signs Assessment: post-procedure vital signs reviewed and stable Respiratory status: patient remains intubated per anesthesia plan Cardiovascular status: stable Postop Assessment: no apparent nausea or vomiting Anesthetic complications: no    Last Vitals:  Vitals:   01/03/19 1200 01/03/19 1215  BP: (!) 123/41 (!) 97/54  Pulse: (!) 110   Resp: 16   Temp:  36.4 C  SpO2:      Last Pain:  Vitals:   01/03/19 1215  TempSrc: Axillary  PainSc:                  Tiajuana Amass

## 2019-01-03 NOTE — Progress Notes (Signed)
CRITICAL VALUE ALERT  Critical Value:  Lactic acid, 2.7   Troponin, 393  Date & Time Notied:  01/03/19, 1410  Provider Notified: Dr Donne Hazel  Orders Received/Actions taken: no new orders, Cardiac consult coming
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
## 2019-01-04 ENCOUNTER — Inpatient Hospital Stay (HOSPITAL_COMMUNITY): Payer: Medicare Other

## 2019-01-04 ENCOUNTER — Encounter (HOSPITAL_COMMUNITY): Payer: Self-pay | Admitting: General Surgery

## 2019-01-04 LAB — CBC
HCT: 26.6 % — ABNORMAL LOW (ref 36.0–46.0)
HCT: 23.2 % — ABNORMAL LOW (ref 36.0–46.0)
Hemoglobin: 8.7 g/dL — ABNORMAL LOW (ref 12.0–15.0)
Hemoglobin: 7.5 g/dL — ABNORMAL LOW (ref 12.0–15.0)
MCH: 28.7 pg (ref 26.0–34.0)
MCH: 28.7 pg (ref 26.0–34.0)
MCHC: 32.7 g/dL (ref 30.0–36.0)
MCHC: 32.3 g/dL (ref 30.0–36.0)
MCV: 87.8 fL (ref 80.0–100.0)
MCV: 88.9 fL (ref 80.0–100.0)
Platelets: 171 10*3/uL (ref 150–400)
Platelets: 123 10*3/uL — ABNORMAL LOW (ref 150–400)
RBC: 3.03 MIL/uL — ABNORMAL LOW (ref 3.87–5.11)
RBC: 2.61 MIL/uL — ABNORMAL LOW (ref 3.87–5.11)
RDW: 14.3 % (ref 11.5–15.5)
RDW: 14.6 % (ref 11.5–15.5)
WBC: 7.4 10*3/uL (ref 4.0–10.5)
WBC: 4.1 10*3/uL (ref 4.0–10.5)
nRBC: 0.7 % — ABNORMAL HIGH (ref 0.0–0.2)
nRBC: 0.5 % — ABNORMAL HIGH (ref 0.0–0.2)

## 2019-01-04 LAB — BASIC METABOLIC PANEL
Anion gap: 10 (ref 5–15)
Anion gap: 11 (ref 5–15)
Anion gap: 12 (ref 5–15)
Anion gap: 12 (ref 5–15)
Anion gap: 12 (ref 5–15)
Anion gap: 9 (ref 5–15)
BUN: 39 mg/dL — ABNORMAL HIGH (ref 8–23)
BUN: 40 mg/dL — ABNORMAL HIGH (ref 8–23)
BUN: 39 mg/dL — ABNORMAL HIGH (ref 8–23)
BUN: 40 mg/dL — ABNORMAL HIGH (ref 8–23)
BUN: 36 mg/dL — ABNORMAL HIGH (ref 8–23)
BUN: 33 mg/dL — ABNORMAL HIGH (ref 8–23)
CO2: 21 mmol/L — ABNORMAL LOW (ref 22–32)
CO2: 21 mmol/L — ABNORMAL LOW (ref 22–32)
CO2: 21 mmol/L — ABNORMAL LOW (ref 22–32)
CO2: 21 mmol/L — ABNORMAL LOW (ref 22–32)
CO2: 23 mmol/L (ref 22–32)
CO2: 24 mmol/L (ref 22–32)
Calcium: 7 mg/dL — ABNORMAL LOW (ref 8.9–10.3)
Calcium: 6.9 mg/dL — ABNORMAL LOW (ref 8.9–10.3)
Calcium: 6.8 mg/dL — ABNORMAL LOW (ref 8.9–10.3)
Calcium: 7 mg/dL — ABNORMAL LOW (ref 8.9–10.3)
Calcium: 7 mg/dL — ABNORMAL LOW (ref 8.9–10.3)
Calcium: 6.9 mg/dL — ABNORMAL LOW (ref 8.9–10.3)
Chloride: 102 mmol/L (ref 98–111)
Chloride: 102 mmol/L (ref 98–111)
Chloride: 103 mmol/L (ref 98–111)
Chloride: 104 mmol/L (ref 98–111)
Chloride: 105 mmol/L (ref 98–111)
Chloride: 105 mmol/L (ref 98–111)
Creatinine, Ser: 1.67 mg/dL — ABNORMAL HIGH (ref 0.44–1.00)
Creatinine, Ser: 1.74 mg/dL — ABNORMAL HIGH (ref 0.44–1.00)
Creatinine, Ser: 1.39 mg/dL — ABNORMAL HIGH (ref 0.44–1.00)
Creatinine, Ser: 1.58 mg/dL — ABNORMAL HIGH (ref 0.44–1.00)
Creatinine, Ser: 1.44 mg/dL — ABNORMAL HIGH (ref 0.44–1.00)
Creatinine, Ser: 1.25 mg/dL — ABNORMAL HIGH (ref 0.44–1.00)
GFR calc Af Amer: 33 mL/min — ABNORMAL LOW (ref >60)
GFR calc Af Amer: 31 mL/min — ABNORMAL LOW (ref >60)
GFR calc Af Amer: 41 mL/min — ABNORMAL LOW (ref >60)
GFR calc Af Amer: 35 mL/min — ABNORMAL LOW (ref >60)
GFR calc Af Amer: 39 mL/min — ABNORMAL LOW (ref >60)
GFR calc Af Amer: 47 mL/min — ABNORMAL LOW (ref >60)
GFR calc non Af Amer: 28 mL/min — ABNORMAL LOW (ref >60)
GFR calc non Af Amer: 27 mL/min — ABNORMAL LOW (ref >60)
GFR calc non Af Amer: 35 mL/min — ABNORMAL LOW (ref >60)
GFR calc non Af Amer: 30 mL/min — ABNORMAL LOW (ref >60)
GFR calc non Af Amer: 34 mL/min — ABNORMAL LOW (ref >60)
GFR calc non Af Amer: 40 mL/min — ABNORMAL LOW (ref >60)
Glucose, Bld: 347 mg/dL — ABNORMAL HIGH (ref 70–99)
Glucose, Bld: 349 mg/dL — ABNORMAL HIGH (ref 70–99)
Glucose, Bld: 363 mg/dL — ABNORMAL HIGH (ref 70–99)
Glucose, Bld: 368 mg/dL — ABNORMAL HIGH (ref 70–99)
Glucose, Bld: 335 mg/dL — ABNORMAL HIGH (ref 70–99)
Glucose, Bld: 257 mg/dL — ABNORMAL HIGH (ref 70–99)
Potassium: 4.5 mmol/L (ref 3.5–5.1)
Potassium: 4.4 mmol/L (ref 3.5–5.1)
Potassium: 4.1 mmol/L (ref 3.5–5.1)
Potassium: 4.2 mmol/L (ref 3.5–5.1)
Potassium: 3.9 mmol/L (ref 3.5–5.1)
Potassium: 3.6 mmol/L (ref 3.5–5.1)
Sodium: 133 mmol/L — ABNORMAL LOW (ref 135–145)
Sodium: 134 mmol/L — ABNORMAL LOW (ref 135–145)
Sodium: 136 mmol/L (ref 135–145)
Sodium: 137 mmol/L (ref 135–145)
Sodium: 140 mmol/L (ref 135–145)
Sodium: 138 mmol/L (ref 135–145)

## 2019-01-04 LAB — TROPONIN I (HIGH SENSITIVITY): Troponin I (High Sensitivity): 2812 ng/L (ref <18)

## 2019-01-04 LAB — POCT I-STAT 7, (LYTES, BLD GAS, ICA,H+H)
Acid-base deficit: 2 mmol/L (ref 0.0–2.0)
Bicarbonate: 23 mmol/L (ref 20.0–28.0)
Calcium, Ion: 1.03 mmol/L — ABNORMAL LOW (ref 1.15–1.40)
HCT: 23 % — ABNORMAL LOW (ref 36.0–46.0)
Hemoglobin: 7.8 g/dL — ABNORMAL LOW (ref 12.0–15.0)
O2 Saturation: 93 %
Potassium: 4.1 mmol/L (ref 3.5–5.1)
Sodium: 138 mmol/L (ref 135–145)
TCO2: 24 mmol/L (ref 22–32)
pCO2 arterial: 39.4 mmHg (ref 32.0–48.0)
pH, Arterial: 7.374 (ref 7.350–7.450)
pO2, Arterial: 69 mmHg — ABNORMAL LOW (ref 83.0–108.0)

## 2019-01-04 LAB — LACTIC ACID, PLASMA
Lactic Acid, Venous: 3 mmol/L (ref 0.5–1.9)
Lactic Acid, Venous: 2.7 mmol/L (ref 0.5–1.9)
Lactic Acid, Venous: 3.1 mmol/L (ref 0.5–1.9)
Lactic Acid, Venous: 2.2 mmol/L (ref 0.5–1.9)

## 2019-01-04 LAB — TRIGLYCERIDES: Triglycerides: 321 mg/dL — ABNORMAL HIGH (ref <150)

## 2019-01-04 LAB — GLUCOSE, CAPILLARY
Glucose-Capillary: 312 mg/dL — ABNORMAL HIGH (ref 70–99)
Glucose-Capillary: 286 mg/dL — ABNORMAL HIGH (ref 70–99)
Glucose-Capillary: 338 mg/dL — ABNORMAL HIGH (ref 70–99)
Glucose-Capillary: 335 mg/dL — ABNORMAL HIGH (ref 70–99)
Glucose-Capillary: 267 mg/dL — ABNORMAL HIGH (ref 70–99)
Glucose-Capillary: 222 mg/dL — ABNORMAL HIGH (ref 70–99)

## 2019-01-04 MED ORDER — ALBUMIN HUMAN 5 % IV SOLN
25.0000 g | Freq: Once | INTRAVENOUS | Status: AC
  Administered 2019-01-04: 25 g via INTRAVENOUS
  Filled 2019-01-04: qty 500

## 2019-01-04 MED ORDER — PIPERACILLIN-TAZOBACTAM 3.375 G IVPB 30 MIN
3.3750 g | Freq: Once | INTRAVENOUS | Status: DC
  Filled 2019-01-04: qty 50

## 2019-01-04 MED ORDER — PIPERACILLIN-TAZOBACTAM 3.375 G IVPB
3.3750 g | Freq: Three times a day (TID) | INTRAVENOUS | Status: DC
  Administered 2019-01-04 – 2019-01-08 (×11): 3.375 g via INTRAVENOUS
  Filled 2019-01-04 (×12): qty 50

## 2019-01-04 MED ORDER — ACETAMINOPHEN 650 MG RE SUPP
650.0000 mg | RECTAL | Status: DC | PRN
  Administered 2019-01-04 – 2019-01-10 (×3): 650 mg via RECTAL
  Filled 2019-01-04 (×3): qty 1

## 2019-01-04 MED ORDER — INSULIN ASPART 100 UNIT/ML ~~LOC~~ SOLN
0.0000 [IU] | SUBCUTANEOUS | Status: DC
  Administered 2019-01-04: 7 [IU] via SUBCUTANEOUS
  Administered 2019-01-04: 11 [IU] via SUBCUTANEOUS
  Administered 2019-01-04 (×2): 15 [IU] via SUBCUTANEOUS
  Administered 2019-01-05: 4 [IU] via SUBCUTANEOUS
  Administered 2019-01-05: 16:00:00 3 [IU] via SUBCUTANEOUS
  Administered 2019-01-05 (×2): 4 [IU] via SUBCUTANEOUS
  Administered 2019-01-07: 2 [IU] via SUBCUTANEOUS
  Administered 2019-01-07: 20:00:00 3 [IU] via SUBCUTANEOUS
  Administered 2019-01-07: 2 [IU] via SUBCUTANEOUS
  Administered 2019-01-07 – 2019-01-09 (×7): 3 [IU] via SUBCUTANEOUS
  Administered 2019-01-09: 11 [IU] via SUBCUTANEOUS
  Administered 2019-01-09 (×2): 3 [IU] via SUBCUTANEOUS
  Administered 2019-01-09 (×2): 4 [IU] via SUBCUTANEOUS
  Administered 2019-01-10: 04:00:00 7 [IU] via SUBCUTANEOUS
  Administered 2019-01-10: 4 [IU] via SUBCUTANEOUS
  Administered 2019-01-10 – 2019-01-11 (×7): 7 [IU] via SUBCUTANEOUS
  Administered 2019-01-11 (×2): 4 [IU] via SUBCUTANEOUS
  Administered 2019-01-11: 7 [IU] via SUBCUTANEOUS
  Administered 2019-01-12 – 2019-01-13 (×7): 4 [IU] via SUBCUTANEOUS
  Administered 2019-01-13 (×2): 3 [IU] via SUBCUTANEOUS
  Administered 2019-01-13: 4 [IU] via SUBCUTANEOUS
  Administered 2019-01-13: 3 [IU] via SUBCUTANEOUS
  Administered 2019-01-14: 01:00:00 4 [IU] via SUBCUTANEOUS
  Administered 2019-01-14 – 2019-01-15 (×7): 7 [IU] via SUBCUTANEOUS
  Administered 2019-01-15: 4 [IU] via SUBCUTANEOUS
  Administered 2019-01-15: 3 [IU] via SUBCUTANEOUS
  Administered 2019-01-15: 7 [IU] via SUBCUTANEOUS
  Administered 2019-01-15: 3 [IU] via SUBCUTANEOUS
  Administered 2019-01-16: 7 [IU] via SUBCUTANEOUS
  Administered 2019-01-16 (×5): 4 [IU] via SUBCUTANEOUS
  Administered 2019-01-17: 3 [IU] via SUBCUTANEOUS
  Administered 2019-01-17 (×5): 4 [IU] via SUBCUTANEOUS
  Administered 2019-01-17: 3 [IU] via SUBCUTANEOUS
  Administered 2019-01-18 (×4): 4 [IU] via SUBCUTANEOUS
  Administered 2019-01-19: 7 [IU] via SUBCUTANEOUS
  Administered 2019-01-19 (×2): 3 [IU] via SUBCUTANEOUS
  Administered 2019-01-19: 7 [IU] via SUBCUTANEOUS

## 2019-01-04 MED ORDER — SODIUM CHLORIDE 0.9 % IV BOLUS
1000.0000 mL | Freq: Once | INTRAVENOUS | Status: AC
  Administered 2019-01-04: 1000 mL via INTRAVENOUS

## 2019-01-04 MED ORDER — INSULIN DETEMIR 100 UNIT/ML ~~LOC~~ SOLN
20.0000 [IU] | Freq: Two times a day (BID) | SUBCUTANEOUS | Status: DC
  Filled 2019-01-04: qty 0.2

## 2019-01-04 MED ORDER — PANTOPRAZOLE SODIUM 40 MG IV SOLR
40.0000 mg | INTRAVENOUS | Status: DC
  Administered 2019-01-04 – 2019-01-08 (×5): 40 mg via INTRAVENOUS
  Filled 2019-01-04 (×5): qty 40

## 2019-01-04 MED ORDER — SODIUM CHLORIDE 0.9 % IV SOLN: INTRAVENOUS | Status: DC

## 2019-01-04 MED ORDER — ALBUMIN HUMAN 5 % IV SOLN
25.0000 g | Freq: Once | INTRAVENOUS | Status: AC
  Administered 2019-01-04: 13:00:00 25 g via INTRAVENOUS
  Filled 2019-01-04: qty 500

## 2019-01-04 MED ORDER — INSULIN DETEMIR 100 UNIT/ML ~~LOC~~ SOLN
45.0000 [IU] | Freq: Every day | SUBCUTANEOUS | Status: DC
  Administered 2019-01-04 – 2019-01-05 (×2): 45 [IU] via SUBCUTANEOUS
  Filled 2019-01-04 (×2): qty 0.45

## 2019-01-04 MED ORDER — STERILE WATER FOR INJECTION IV SOLN
INTRAVENOUS | Status: DC
  Administered 2019-01-04 (×2): via INTRAVENOUS
  Filled 2019-01-04 (×3): qty 850

## 2019-01-04 MED ORDER — PIPERACILLIN-TAZOBACTAM 3.375 G IVPB: 3.3750 g | Freq: Three times a day (TID) | INTRAVENOUS | Status: DC

## 2019-01-04 MED ORDER — PIPERACILLIN-TAZOBACTAM 3.375 G IVPB 30 MIN
3.3750 g | Freq: Once | INTRAVENOUS | Status: AC
  Administered 2019-01-04: 10:00:00 3.375 g via INTRAVENOUS
  Filled 2019-01-04 (×2): qty 50

## 2019-01-04 MED ORDER — CALCIUM GLUCONATE-NACL 1-0.675 GM/50ML-% IV SOLN
1.0000 g | Freq: Once | INTRAVENOUS | Status: AC
  Administered 2019-01-04: 09:00:00 1000 mg via INTRAVENOUS
  Filled 2019-01-04: qty 50
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
## 2019-01-04 NOTE — Progress Notes (Signed)
Critical troponin result 2,812. Cardiology notified. No new orders given.

## 2019-01-04 NOTE — Progress Notes (Signed)
Pharmacy Antibiotic Note  Heather Lyons is a 84 y.o. female admitted on 01/01/2019 with intra-abdominal infection.  Pharmacy has been consulted for Zosyn dosing. SCr 1.74 stable.  Plan: Zosyn 3.375g IV (46min infusion) x1; then 3.375g IV q8h (4h infusion) Monitor clinical progress, c/s, renal function F/u de-escalation plan/LOT Pharmacy will s/o consult and follow peripherally  Height: 5\' 6"  (167.6 cm) Weight: 218 lb 7.6 oz (99.1 kg) IBW/kg (Calculated) : 59.3  Temp (24hrs), Avg:98.9 F (37.2 C), Min:97.5 F (36.4 C), Max:101.4 F (38.6 C)  Recent Labs  Lab 01/01/19 2022  01/02/19 1340  01/02/19 1848  01/03/19 1234 01/03/19 1330 01/03/19 1644 01/03/19 2229 01/03/19 2321 01/04/19 0047 01/04/19 0457  WBC 13.9*   < > 17.5*  --  17.1*  --  10.1  --  9.9  --   --   --  7.4  CREATININE 0.86   < >  --    < > 2.15*   < > 1.88*  --  1.65* 1.69*  --  1.67* 1.74*  LATICACIDVEN 2.5*  --   --   --   --   --   --  2.7* 2.5*  --  3.0*  --   --    < > = values in this interval not displayed.    Estimated Creatinine Clearance: 30.1 mL/min (A) (by C-G formula based on SCr of 1.74 mg/dL (H)).    Allergies  Allergen Reactions  . Adrenocorticotrophic Hormone [Corticotropin] Anaphylaxis and Swelling  . Purell Instant Hand [Alcohol] Other (See Comments)    sneeze  . Aloe Vera Rash    Elicia Lamp, PharmD, BCPS Please check AMION for all Chambers contact numbers Clinical Pharmacist 01/04/2019 8:15 AM
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
## 2019-01-04 NOTE — Progress Notes (Signed)
Orthopedic Tech Progress Note Patient Details:  Heather Lyons 1934-08-31 [id number] Had help from the RN with holding while I applied the splint. Ortho Devices Type of Ortho Device: Post (short leg) splint Ortho Device/Splint Location: LRE Ortho Device/Splint Interventions: Application, Ordered   Post Interventions Patient Tolerated: Well Instructions Provided: Care of device, Adjustment of device   Janit Pagan 01/04/2019, 9:44 AM

## 2019-01-04 NOTE — Progress Notes (Signed)
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 for measurements and observations.    Preliminary         Scheduled Meds:  chlorhexidine gluconate (MEDLINE KIT)  15 mL Mouth Rinse BID   Chlorhexidine Gluconate Cloth  6 each Topical Daily   dextrose  25 mL Intravenous Once   fentaNYL (SUBLIMAZE) injection  25 mcg Intravenous Once   insulin aspart  0-20 Units Subcutaneous Q4H   insulin detemir  45 Units Subcutaneous Daily   ipratropium-albuterol  3 mL Nebulization Q6H   mouth rinse  15 mL Mouth Rinse BID   mouth rinse  15 mL Mouth Rinse 10 times per day   pantoprazole (PROTONIX) IV  40 mg Intravenous Q24H   Continuous Infusions:  fentaNYL infusion INTRAVENOUS 75 mcg/hr (01/04/19 1300)   lactated ringers 10 mL/hr at 01/03/19 1008   methocarbamol (ROBAXIN) IV     norepinephrine (LEVOPHED) Adult infusion 22 mcg/min (01/04/19 1300)   phenylephrine (NEO-SYNEPHRINE) Adult infusion 380 mcg/min (01/04/19 1300)    piperacillin-tazobactam (ZOSYN)  IV     propofol (DIPRIVAN) infusion 15 mcg/kg/min (01/04/19 1300)    sodium bicarbonate (isotonic) infusion in sterile water       LOS: 3 days    Time spent: 35 mins.More than 50% of that time was spent in counseling and/or coordination of care.      Shelly Coss, MD [facility] Pager [phone]  If 7PM-7AM, please contact night-coverage [email or web address] Password [email or web address] 01/04/2019, 2:36 PM

## 2019-01-04 NOTE — Progress Notes (Signed)
Patient ID: Heather Lyons, female   DOB: 1934/07/08, 84 y.o.   MRN: [id number] I met with her daughter, son, and son-in-law and updated them. Plan take back to the OR tomorrow for ex lap, possible bowel resection, possible ostomy. I discussed the procedure, risks, and benefits with them and answered their questions. They agree. Cardiology and Nephrology F/U appreciated.  Georganna Skeans, MD, MPH, FACS Trauma & General Surgery: [phone]

## 2019-01-04 NOTE — Progress Notes (Signed)
Inpatient Diabetes Program Recommendations  AACE/ADA: New Consensus Statement on Inpatient Glycemic Control   Target Ranges:  Prepandial:   less than 140 mg/dL      Peak postprandial:   less than 180 mg/dL (1-2 hours)      Critically ill patients:  140 - 180 mg/dL  Results for Heather Lyons, Heather Lyons (MRN [id number]) as of 01/04/2019 10:06  Ref. Range 01/03/2019 06:17 01/03/2019 15:36 01/03/2019 19:26 01/03/2019 23:21 01/04/2019 03:10 01/04/2019 07:13  Glucose-Capillary Latest Ref Range: 70 - 99 mg/dL 276 (H) 241 (H) 282 (H) 307 (H) 312 (H) 286 (H)   Results for Heather Lyons, Heather Lyons (MRN [id number]) as of 01/04/2019 10:06  Ref. Range 01/02/2019 08:36 01/02/2019 12:31 01/02/2019 15:34 01/02/2019 17:20 01/02/2019 21:34  Glucose-Capillary Latest Ref Range: 70 - 99 mg/dL 397 (H) 370 (H) 312 (H) 248 (H) 135 (H)   Review of Glycemic Control  Diabetes history: DM2 Outpatient Diabetes medications: Levemir 45 units QHS, Glipizide XL 20 mg daily, Ozempic 0.25 mg Qweek Current orders for Inpatient glycemic control: Levemir 20 units QHS, Novolog 0-20 units TID with meals  Inpatient Diabetes Program Recommendations:   Insulin - Basal: Please consider increasing Levemir to 20 units BID.  Correction (SSI): Please change CBGs and Novolog 0-20 units to Q4H.  NOTE: In reviewing chart for merger (MRN [id number]), noted patient seen Dr. Tracey Harries (Endocrinologist) on 10/31/18. A1C 12.3% on 10/20/18. At office visit on 10/31/18, patient was started on Ozempic 0.25 mg Qweek (Wednesday) with instructions to take 0.25 mg x3 weeks then 0.5 mg x3 weeks and then 1 mg Qweek thereafter. Patient was also prescribed Levemir 45 units QHS and Glipizide XL 20 mg daily for DM control. Noted in office note by Dr. Tracey Harries on 10/31/18 that patient had been injecting Levemir into her palm and was advised not to inject there because of likely poor absorption. Patient is currently on ventilator and NPO. Recommend increasing Levemir to 20 units BID and changing frequency of CBGs and Novolog  correction to Q4H.  Thanks, Barnie Alderman, RN, MSN, CDE Diabetes Coordinator Inpatient Diabetes Program [phone] (Team Pager from 8am to 5pm)
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
## 2019-01-05 ENCOUNTER — Encounter (HOSPITAL_COMMUNITY): Admission: EM | Disposition: A | Discharge status: Skilled Nursing Facility | Payer: Self-pay | Source: Home / Self Care

## 2019-01-05 ENCOUNTER — Inpatient Hospital Stay (HOSPITAL_COMMUNITY): Payer: Medicare Other | Admitting: Certified Registered Nurse Anesthetist

## 2019-01-05 ENCOUNTER — Inpatient Hospital Stay (HOSPITAL_COMMUNITY): Payer: Medicare Other

## 2019-01-05 DIAGNOSIS — Z794 Long term (current) use of insulin: Secondary | ICD-10-CM

## 2019-01-05 DIAGNOSIS — E119 Type 2 diabetes mellitus without complications: Secondary | ICD-10-CM

## 2019-01-05 DIAGNOSIS — R509 Fever, unspecified: Secondary | ICD-10-CM

## 2019-01-05 HISTORY — PX: ILEOSTOMY: SHX1783

## 2019-01-05 HISTORY — PX: LAPAROTOMY: SHX154

## 2019-01-05 LAB — POCT I-STAT 7, (LYTES, BLD GAS, ICA,H+H)
Acid-Base Excess: 1 mmol/L (ref 0.0–2.0)
Bicarbonate: 26 mmol/L (ref 20.0–28.0)
Calcium, Ion: 1.02 mmol/L — ABNORMAL LOW (ref 1.15–1.40)
HCT: 20 % — ABNORMAL LOW (ref 36.0–46.0)
Hemoglobin: 6.8 g/dL — CL (ref 12.0–15.0)
O2 Saturation: 95 %
Potassium: 3.3 mmol/L — ABNORMAL LOW (ref 3.5–5.1)
Sodium: 143 mmol/L (ref 135–145)
TCO2: 27 mmol/L (ref 22–32)
pCO2 arterial: 41.7 mmHg (ref 32.0–48.0)
pH, Arterial: 7.403 (ref 7.350–7.450)
pO2, Arterial: 74 mmHg — ABNORMAL LOW (ref 83.0–108.0)

## 2019-01-05 LAB — GLUCOSE, CAPILLARY
Glucose-Capillary: 168 mg/dL — ABNORMAL HIGH (ref 70–99)
Glucose-Capillary: 166 mg/dL — ABNORMAL HIGH (ref 70–99)
Glucose-Capillary: 179 mg/dL — ABNORMAL HIGH (ref 70–99)
Glucose-Capillary: 133 mg/dL — ABNORMAL HIGH (ref 70–99)
Glucose-Capillary: 106 mg/dL — ABNORMAL HIGH (ref 70–99)
Glucose-Capillary: 99 mg/dL (ref 70–99)

## 2019-01-05 LAB — BASIC METABOLIC PANEL
Anion gap: 8 (ref 5–15)
Anion gap: 9 (ref 5–15)
Anion gap: 9 (ref 5–15)
Anion gap: 8 (ref 5–15)
Anion gap: 9 (ref 5–15)
Anion gap: 8 (ref 5–15)
BUN: 27 mg/dL — ABNORMAL HIGH (ref 8–23)
BUN: 26 mg/dL — ABNORMAL HIGH (ref 8–23)
BUN: 24 mg/dL — ABNORMAL HIGH (ref 8–23)
BUN: 24 mg/dL — ABNORMAL HIGH (ref 8–23)
BUN: 23 mg/dL (ref 8–23)
BUN: 24 mg/dL — ABNORMAL HIGH (ref 8–23)
CO2: 26 mmol/L (ref 22–32)
CO2: 26 mmol/L (ref 22–32)
CO2: 26 mmol/L (ref 22–32)
CO2: 27 mmol/L (ref 22–32)
CO2: 26 mmol/L (ref 22–32)
CO2: 26 mmol/L (ref 22–32)
Calcium: 6.9 mg/dL — ABNORMAL LOW (ref 8.9–10.3)
Calcium: 6.8 mg/dL — ABNORMAL LOW (ref 8.9–10.3)
Calcium: 6.7 mg/dL — ABNORMAL LOW (ref 8.9–10.3)
Calcium: 6.9 mg/dL — ABNORMAL LOW (ref 8.9–10.3)
Calcium: 6.8 mg/dL — ABNORMAL LOW (ref 8.9–10.3)
Calcium: 6.8 mg/dL — ABNORMAL LOW (ref 8.9–10.3)
Chloride: 107 mmol/L (ref 98–111)
Chloride: 106 mmol/L (ref 98–111)
Chloride: 106 mmol/L (ref 98–111)
Chloride: 107 mmol/L (ref 98–111)
Chloride: 106 mmol/L (ref 98–111)
Chloride: 106 mmol/L (ref 98–111)
Creatinine, Ser: 1.06 mg/dL — ABNORMAL HIGH (ref 0.44–1.00)
Creatinine, Ser: 1.02 mg/dL — ABNORMAL HIGH (ref 0.44–1.00)
Creatinine, Ser: 0.99 mg/dL (ref 0.44–1.00)
Creatinine, Ser: 0.99 mg/dL (ref 0.44–1.00)
Creatinine, Ser: 0.93 mg/dL (ref 0.44–1.00)
Creatinine, Ser: 0.92 mg/dL (ref 0.44–1.00)
GFR calc Af Amer: 57 mL/min — ABNORMAL LOW (ref >60)
GFR calc Af Amer: 60 mL/min — ABNORMAL LOW (ref >60)
GFR calc Af Amer: >60 mL/min (ref >60)
GFR calc Af Amer: >60 mL/min (ref >60)
GFR calc Af Amer: >60 mL/min (ref >60)
GFR calc Af Amer: >60 mL/min (ref >60)
GFR calc non Af Amer: 49 mL/min — ABNORMAL LOW (ref >60)
GFR calc non Af Amer: 52 mL/min — ABNORMAL LOW (ref >60)
GFR calc non Af Amer: 53 mL/min — ABNORMAL LOW (ref >60)
GFR calc non Af Amer: 53 mL/min — ABNORMAL LOW (ref >60)
GFR calc non Af Amer: 58 mL/min — ABNORMAL LOW (ref >60)
GFR calc non Af Amer: 58 mL/min — ABNORMAL LOW (ref >60)
Glucose, Bld: 188 mg/dL — ABNORMAL HIGH (ref 70–99)
Glucose, Bld: 175 mg/dL — ABNORMAL HIGH (ref 70–99)
Glucose, Bld: 172 mg/dL — ABNORMAL HIGH (ref 70–99)
Glucose, Bld: 165 mg/dL — ABNORMAL HIGH (ref 70–99)
Glucose, Bld: 136 mg/dL — ABNORMAL HIGH (ref 70–99)
Glucose, Bld: 110 mg/dL — ABNORMAL HIGH (ref 70–99)
Potassium: 3.3 mmol/L — ABNORMAL LOW (ref 3.5–5.1)
Potassium: 3.4 mmol/L — ABNORMAL LOW (ref 3.5–5.1)
Potassium: 3.5 mmol/L (ref 3.5–5.1)
Potassium: 3.7 mmol/L (ref 3.5–5.1)
Potassium: 3.6 mmol/L (ref 3.5–5.1)
Potassium: 3.3 mmol/L — ABNORMAL LOW (ref 3.5–5.1)
Sodium: 141 mmol/L (ref 135–145)
Sodium: 141 mmol/L (ref 135–145)
Sodium: 141 mmol/L (ref 135–145)
Sodium: 142 mmol/L (ref 135–145)
Sodium: 141 mmol/L (ref 135–145)
Sodium: 140 mmol/L (ref 135–145)

## 2019-01-05 LAB — CBC WITH DIFFERENTIAL/PLATELET
Abs Immature Granulocytes: 0 10*3/uL (ref 0.00–0.07)
Band Neutrophils: 1 %
Basophils Absolute: 0.1 10*3/uL (ref 0.0–0.1)
Basophils Relative: 3 %
Eosinophils Absolute: 0 10*3/uL (ref 0.0–0.5)
Eosinophils Relative: 0 %
HCT: 23.1 % — ABNORMAL LOW (ref 36.0–46.0)
Hemoglobin: 7.4 g/dL — ABNORMAL LOW (ref 12.0–15.0)
Lymphocytes Relative: 11 %
Lymphs Abs: 0.5 10*3/uL — ABNORMAL LOW (ref 0.7–4.0)
MCH: 28.4 pg (ref 26.0–34.0)
MCHC: 32 g/dL (ref 30.0–36.0)
MCV: 88.5 fL (ref 80.0–100.0)
Monocytes Absolute: 0.3 10*3/uL (ref 0.1–1.0)
Monocytes Relative: 6 %
Neutro Abs: 3.5 10*3/uL (ref 1.7–7.7)
Neutrophils Relative %: 79 %
Platelets: 128 10*3/uL — ABNORMAL LOW (ref 150–400)
RBC: 2.61 MIL/uL — ABNORMAL LOW (ref 3.87–5.11)
RDW: 14.7 % (ref 11.5–15.5)
WBC: 4.4 10*3/uL (ref 4.0–10.5)
nRBC: 0 /100 WBC
nRBC: 0.9 % — ABNORMAL HIGH (ref 0.0–0.2)

## 2019-01-05 LAB — CBC
HCT: 30.4 % — ABNORMAL LOW (ref 36.0–46.0)
Hemoglobin: 10 g/dL — ABNORMAL LOW (ref 12.0–15.0)
MCH: 29.2 pg (ref 26.0–34.0)
MCHC: 32.9 g/dL (ref 30.0–36.0)
MCV: 88.9 fL (ref 80.0–100.0)
Platelets: 96 10*3/uL — ABNORMAL LOW (ref 150–400)
RBC: 3.42 MIL/uL — ABNORMAL LOW (ref 3.87–5.11)
RDW: 14.6 % (ref 11.5–15.5)
WBC: 6.3 10*3/uL (ref 4.0–10.5)
nRBC: 0.5 % — ABNORMAL HIGH (ref 0.0–0.2)

## 2019-01-05 LAB — PREPARE RBC (CROSSMATCH)

## 2019-01-05 LAB — TRIGLYCERIDES: Triglycerides: 146 mg/dL (ref <150)

## 2019-01-05 LAB — LACTIC ACID, PLASMA: Lactic Acid, Venous: 1.4 mmol/L (ref 0.5–1.9)

## 2019-01-05 SURGERY — LAPAROTOMY, EXPLORATORY
Anesthesia: General | Site: Abdomen

## 2019-01-05 MED ORDER — PROPOFOL 10 MG/ML IV BOLUS
INTRAVENOUS | Status: AC
  Filled 2019-01-05: qty 20

## 2019-01-05 MED ORDER — LACTATED RINGERS IV SOLN
INTRAVENOUS | Status: DC | PRN
  Administered 2019-01-05: 08:00:00 via INTRAVENOUS

## 2019-01-05 MED ORDER — 0.9 % SODIUM CHLORIDE (POUR BTL) OPTIME
TOPICAL | Status: DC | PRN
  Administered 2019-01-05 (×3): 1000 mL

## 2019-01-05 MED ORDER — DEXAMETHASONE SODIUM PHOSPHATE 10 MG/ML IJ SOLN
INTRAMUSCULAR | Status: AC
  Filled 2019-01-05: qty 1

## 2019-01-05 MED ORDER — ONDANSETRON HCL 4 MG/2ML IJ SOLN
INTRAMUSCULAR | Status: AC
  Filled 2019-01-05: qty 2

## 2019-01-05 MED ORDER — ROCURONIUM BROMIDE 10 MG/ML (PF) SYRINGE
PREFILLED_SYRINGE | INTRAVENOUS | Status: DC | PRN
  Administered 2019-01-05: 80 mg via INTRAVENOUS
  Administered 2019-01-05: 20 mg via INTRAVENOUS

## 2019-01-05 MED ORDER — SODIUM CHLORIDE 0.9% IV SOLUTION: Freq: Once | INTRAVENOUS | Status: DC

## 2019-01-05 MED ORDER — ROCURONIUM BROMIDE 10 MG/ML (PF) SYRINGE
PREFILLED_SYRINGE | INTRAVENOUS | Status: AC
  Filled 2019-01-05: qty 10

## 2019-01-05 MED ORDER — LIDOCAINE 2% (20 MG/ML) 5 ML SYRINGE
INTRAMUSCULAR | Status: AC
  Filled 2019-01-05: qty 5

## 2019-01-05 MED ORDER — SODIUM CHLORIDE 0.9% IV SOLUTION
Freq: Once | INTRAVENOUS | Status: AC
  Administered 2019-01-09: 16:00:00 via INTRAVENOUS

## 2019-01-05 MED ORDER — CALCIUM GLUCONATE-NACL 1-0.675 GM/50ML-% IV SOLN
1.0000 g | Freq: Once | INTRAVENOUS | Status: AC
  Administered 2019-01-05: 1000 mg via INTRAVENOUS
  Filled 2019-01-05: qty 50

## 2019-01-05 SURGICAL SUPPLY — 45 items
BLADE CLIPPER SURG (BLADE) IMPLANT
BNDG GAUZE ELAST 4 BULKY (GAUZE/BANDAGES/DRESSINGS) ×3 IMPLANT
CANISTER SUCT 3000ML PPV (MISCELLANEOUS) ×3 IMPLANT
CHLORAPREP W/TINT 26 (MISCELLANEOUS) IMPLANT
COVER SURGICAL LIGHT HANDLE (MISCELLANEOUS) ×3 IMPLANT
COVER WAND RF STERILE (DRAPES) ×3 IMPLANT
DRAPE LAPAROSCOPIC ABDOMINAL (DRAPES) ×3 IMPLANT
DRAPE WARM FLUID 44X44 (DRAPES) ×3 IMPLANT
DRSG OPSITE POSTOP 4X10 (GAUZE/BANDAGES/DRESSINGS) IMPLANT
DRSG OPSITE POSTOP 4X8 (GAUZE/BANDAGES/DRESSINGS) IMPLANT
ELECT BLADE 6.5 EXT (BLADE) IMPLANT
ELECT CAUTERY BLADE 6.4 (BLADE) ×3 IMPLANT
ELECT REM PT RETURN 9FT ADLT (ELECTROSURGICAL) ×3
ELECTRODE REM PT RTRN 9FT ADLT (ELECTROSURGICAL) ×1 IMPLANT
GAUZE SPONGE 4X4 12PLY STRL (GAUZE/BANDAGES/DRESSINGS) ×3 IMPLANT
GLOVE BIO SURGEON STRL SZ8 (GLOVE) ×3 IMPLANT
GLOVE BIOGEL PI IND STRL 8 (GLOVE) ×1 IMPLANT
GLOVE BIOGEL PI INDICATOR 8 (GLOVE) ×2
GOWN STRL REUS W/ TWL LRG LVL3 (GOWN DISPOSABLE) ×1 IMPLANT
GOWN STRL REUS W/ TWL XL LVL3 (GOWN DISPOSABLE) ×1 IMPLANT
GOWN STRL REUS W/TWL LRG LVL3 (GOWN DISPOSABLE) ×2
GOWN STRL REUS W/TWL XL LVL3 (GOWN DISPOSABLE) ×2
HANDLE SUCTION POOLE (INSTRUMENTS) ×1 IMPLANT
KIT BASIN OR (CUSTOM PROCEDURE TRAY) ×3 IMPLANT
KIT OSTOMY DRAINABLE 2.75 STR (WOUND CARE) ×3 IMPLANT
KIT TURNOVER KIT B (KITS) ×3 IMPLANT
LIGASURE IMPACT 36 18CM CVD LR (INSTRUMENTS) IMPLANT
NS IRRIG 1000ML POUR BTL (IV SOLUTION) ×6 IMPLANT
PACK GENERAL/GYN (CUSTOM PROCEDURE TRAY) ×3 IMPLANT
PAD ABD 8X10 STRL (GAUZE/BANDAGES/DRESSINGS) ×6 IMPLANT
PAD ARMBOARD 7.5X6 YLW CONV (MISCELLANEOUS) ×3 IMPLANT
PENCIL SMOKE EVACUATOR (MISCELLANEOUS) ×3 IMPLANT
SPECIMEN JAR LARGE (MISCELLANEOUS) IMPLANT
SPONGE LAP 18X18 RF (DISPOSABLE) IMPLANT
STAPLER VISISTAT 35W (STAPLE) IMPLANT
SUCTION POOLE HANDLE (INSTRUMENTS) ×3
SUT PDS AB 1 TP1 96 (SUTURE) ×6 IMPLANT
SUT SILK 2 0 SH CR/8 (SUTURE) ×3 IMPLANT
SUT SILK 2 0 TIES 10X30 (SUTURE) ×3 IMPLANT
SUT SILK 3 0 SH CR/8 (SUTURE) ×3 IMPLANT
SUT SILK 3 0 TIES 10X30 (SUTURE) ×3 IMPLANT
SUT VIC AB 3-0 SH 18 (SUTURE) ×3 IMPLANT
TOWEL GREEN STERILE (TOWEL DISPOSABLE) ×3 IMPLANT
TRAY FOLEY MTR SLVR 16FR STAT (SET/KITS/TRAYS/PACK) IMPLANT
YANKAUER SUCT BULB TIP NO VENT (SUCTIONS) IMPLANT
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
## 2019-01-05 NOTE — Progress Notes (Signed)
Handoff given CRNA and RN. Patient stable upon transfer. Lianne Bushy RN BSN.

## 2019-01-05 NOTE — Anesthesia Postprocedure Evaluation (Signed)
Anesthesia Post Note  Patient: Heather Lyons  Procedure(s) Performed: EXPLORATORY LAPAROTOMY WITH ABDOMINAL CLOSURE (N/A Abdomen) ILEOSTOMY (N/A Abdomen)     Patient location during evaluation: ICU Anesthesia Type: General Level of consciousness: sedated Pain management: pain level controlled Vital Signs Assessment: post-procedure vital signs reviewed and stable Respiratory status: patient on ventilator - see flowsheet for VS Cardiovascular status: blood pressure returned to baseline and stable Postop Assessment: no apparent nausea or vomiting Anesthetic complications: no    Last Vitals:  Vitals:   01/05/19 0949 01/05/19 1000  BP:  (!) 149/54  Pulse:  (!) 101  Resp:  (!) 24  Temp:    SpO2: 97% 97%    Last Pain:  Vitals:   01/05/19 0927  TempSrc: Axillary  PainSc:                  Lynda Rainwater

## 2019-01-05 NOTE — Progress Notes (Signed)
Inpatient Diabetes Program Recommendations  AACE/ADA: New Consensus Statement on Inpatient Glycemic Control (2015)  Target Ranges:  Prepandial:   less than 140 mg/dL      Peak postprandial:   less than 180 mg/dL (1-2 hours)      Critically ill patients:  140 - 180 mg/dL   Results for Heather Lyons, Heather Lyons (MRN [id number]) as of 01/05/2019 10:56  Ref. Range 01/04/2019 07:13 01/04/2019 11:16 01/04/2019 15:16 01/04/2019 19:24 01/04/2019 23:06 01/05/2019 03:10 01/05/2019 07:14  Glucose-Capillary Latest Ref Range: 70 - 99 mg/dL 286 (H)  Novolog 11 units 338 (H)  Novolog 15 units 335 (H)  Novolog 15 units  Lantus 45 units 267 (H)  Novolog 11 units 222 (H)  Novolog 7 units 168 (H)  Novolog 4 units 166 (H)  Novolog 4 units  Lantus 45 units   Review of Glycemic Control  Diabetes history: DM2 Outpatient Diabetes medications: Levemir 45 units QHS, Glipizide XL 20 mg daily, Ozempic 0.25 mg Qweek Current orders for Inpatient glycemic control: Lantus 45 units daily, Novolog 0-20 units Q4H  Inpatient Diabetes Program Recommendations:   Insulin - Basal: Please consider increasing Lantus to 50 units daily. If MD agreeable, please also order time time Lantus 5 unit x1 now (since patient already given Lantus 45 units today).   NOTE: In reviewing chart for merger (MRN [id number]), noted patient seen Dr. Tracey Harries (Endocrinologist) on 10/31/18. A1C 12.3% on 10/20/18. At office visit on 10/31/18, patient was started on Ozempic 0.25 mg Qweek (Wednesday) with instructions to take 0.25 mg x3 weeks then 0.5 mg x3 weeks and then 1 mg Qweek thereafter. Patient was also prescribed Levemir 45 units QHS and Glipizide XL 20 mg daily for DM control. Noted in office note by Dr. Tracey Harries on 10/31/18 that patient had been injecting Levemir into her palm and was advised not to inject there because of likely poor absorption. Patient is currently on ventilator and NPO.   Thanks, Barnie Alderman, RN, MSN, CDE Diabetes Coordinator Inpatient Diabetes  Program [phone] (Team Pager from 8am to 5pm)

## 2019-01-05 NOTE — Progress Notes (Signed)
Arterial line dressing changed. Refer to flowsheet for details. Arterial line has a good waveform and draws back. Patient is stable at this time.

## 2019-01-05 NOTE — Consult Note (Signed)
WOC consult requested to follow for ileostomy teaching.  Pt is in the OR today for surgery and WOC team will plan to perform the initial teaching session on Monday. Julien Girt MSN, RN, [place], [place], [place]

## 2019-01-05 NOTE — Progress Notes (Signed)
Assisted tele visit to patient with daughter.  Dalyce Renne McEachran, RN  
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
## 2019-01-05 NOTE — Transfer of Care (Signed)
Immediate Anesthesia Transfer of Care Note  Patient: Heather Lyons  Procedure(s) Performed: EXPLORATORY LAPAROTOMY WITH ABDOMINAL CLOSURE (N/A Abdomen) ILEOSTOMY (N/A Abdomen)  Patient Location: ICU  Anesthesia Type:General  Level of Consciousness: sedated and Patient remains intubated per anesthesia plan  Airway & Oxygen Therapy: Patient remains intubated per anesthesia plan and Patient placed on Ventilator (see vital sign flow sheet for setting)  Post-op Assessment: Report given to RN and Post -op Vital signs reviewed and stable  Post vital signs: Reviewed and stable  Last Vitals:  Vitals Value Taken Time  BP    Temp    Pulse    Resp    SpO2      Last Pain:  Vitals:   01/05/19 0721  TempSrc: Axillary  PainSc:          Complications: No apparent anesthesia complications
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
## 2019-01-05 NOTE — Anesthesia Preprocedure Evaluation (Signed)
Anesthesia Evaluation  Patient identified by MRN, date of birth, ID band Patient awake    Reviewed: Allergy & Precautions, NPO status , Patient's Chart, lab work & pertinent test results  Airway Mallampati: Intubated       Dental  (+) Dental Advisory Given   Pulmonary  Fractured ribs with hemothorax and chest tube    breath sounds clear to auscultation       Cardiovascular + CAD   Rhythm:Regular Rate:Normal     Neuro/Psych negative neurological ROS     GI/Hepatic negative GI ROS, Neg liver ROS, Bowel necrosis    Endo/Other  diabetes, Insulin Dependent  Renal/GU ARFRenal disease     Musculoskeletal   Abdominal   Peds  Hematology  (+) anemia ,   Anesthesia Other Findings   Reproductive/Obstetrics                             Lab Results  Component Value Date   WBC 4.4 01/05/2019   HGB 6.8 (LL) 01/05/2019   HCT 20.0 (L) 01/05/2019   MCV 88.5 01/05/2019   PLT 128 (L) 01/05/2019   Lab Results  Component Value Date   CREATININE 1.02 (H) 01/05/2019   BUN 26 (H) 01/05/2019   NA 143 01/05/2019   K 3.3 (L) 01/05/2019   CL 106 01/05/2019   CO2 26 01/05/2019    Anesthesia Physical  Anesthesia Plan  ASA: IV  Anesthesia Plan: General   Post-op Pain Management:    Induction: Intravenous  PONV Risk Score and Plan: 3 and Ondansetron, Treatment may vary due to age or medical condition, Dexamethasone and Midazolam  Airway Management Planned: Oral ETT  Additional Equipment: Arterial line and CVP  Intra-op Plan:   Post-operative Plan: Post-operative intubation/ventilation  Informed Consent: I have reviewed the patients History and Physical, chart, labs and discussed the procedure including the risks, benefits and alternatives for the proposed anesthesia with the patient or authorized representative who has indicated his/her understanding and acceptance.     Dental advisory  given  Plan Discussed with: CRNA  Anesthesia Plan Comments:         Anesthesia Quick Evaluation

## 2019-01-05 NOTE — Op Note (Signed)
01/01/2019 - 01/05/2019  8:39 AM  PATIENT:  Heather Lyons  84 y.o. female  PRE-OPERATIVE DIAGNOSIS:  Open abdominal wound post small bowel resection.  POST-OPERATIVE DIAGNOSIS:  Open abdominal wound post small bowel resection. Remaining bowel viable.  PROCEDURE:  Procedure(s): EXPLORATORY LAPAROTOMY WITH ABDOMINAL CLOSURE ILEOSTOMY  SURGEON:  Surgeon(s): Georganna Skeans, MD  ASSISTANTS: Saverio Danker, PA-C  ANESTHESIA:   general  EBL:  Total I/O In: -  Out: 100 [Drains:100]  BLOOD ADMINISTERED:2u CC PRBC  DRAINS: none   SPECIMEN:  No Specimen  DISPOSITION OF SPECIMEN:  N/A  COUNTS:  YES  DICTATION: .Dragon Dictation Findings: Remaining small bowel and colon all viable, no new abnormalities noted  Procedure in detail: Patient is brought back for planned reexploration after resection of distal small bowel and cecum for ischemic bowel.  Informed consent was obtained from her daughter.  She is on IV antibiotics.  She was brought directly from the intensive care unit to the operating room.  General anesthesia was administered by the anesthesia staff.  Her outer VAC drape and sponge were removed.  Her abdomen was prepped and draped in a sterile fashion.  We did a timeout procedure.  I irrigated her inner VAC sheet and carefully removed it.  The abdomen was then explored.  The small bowel was run down from the ligament of Treitz to the distal resection margin.  It was completely viable.  There was no succus in the abdomen.  The remaining right colon, transverse colon, left colon, and sigmoid colon were all viable.  NG tube was in good position in the stomach.  No other abnormalities were found.  Decision was made to bring out an ileostomy.  I made a circular incision Between the umbilicus and her ASIS.  Subcutaneous tissues were dissected down and the fat was cored out and removed.  A cruciate incision was made in the fascia and this was opened to admit 2 fingers.  The end of the ileum was  brought out through there easily.  Bowel was returned to anatomic position otherwise.  Abdomen was copiously irrigated.  Hemostasis was ensured.  Midline fascia was closed with running #1 looped PDS.  The wound was protected.  The ileostomy was matured in a Brooke fashion with interrupted 3-0 Vicryl.  It was viable.  Ostomy appliance was applied to the stoma and wet-to-dry dressing was placed on the midline.  All counts were correct.  She tolerated procedure without complication was taken directly back to the intensive care unit on the ventilator in critical condition. PATIENT DISPOSITION:  ICU - intubated and critically ill.   Delay start of Pharmacological VTE agent (>24hrs) due to surgical blood loss or risk of bleeding:  yes  Georganna Skeans, MD, MPH, FACS Pager: [phone]  7/10/20208:39 AM

## 2019-01-05 NOTE — Progress Notes (Signed)
   Patient is in the OR.  No cardiac events noted overnight.  Please call with any questions today.  We will see again tomorrow.

## 2019-01-06 ENCOUNTER — Inpatient Hospital Stay (HOSPITAL_COMMUNITY): Payer: Medicare Other

## 2019-01-06 ENCOUNTER — Inpatient Hospital Stay: Payer: Self-pay

## 2019-01-06 DIAGNOSIS — E875 Hyperkalemia: Secondary | ICD-10-CM

## 2019-01-06 DIAGNOSIS — I959 Hypotension, unspecified: Secondary | ICD-10-CM

## 2019-01-06 DIAGNOSIS — R7989 Other specified abnormal findings of blood chemistry: Secondary | ICD-10-CM

## 2019-01-06 DIAGNOSIS — K55029 Acute infarction of small intestine, extent unspecified: Secondary | ICD-10-CM

## 2019-01-06 DIAGNOSIS — E1169 Type 2 diabetes mellitus with other specified complication: Secondary | ICD-10-CM

## 2019-01-06 LAB — BASIC METABOLIC PANEL
Anion gap: 8 (ref 5–15)
Anion gap: 7 (ref 5–15)
Anion gap: 8 (ref 5–15)
Anion gap: 9 (ref 5–15)
Anion gap: 10 (ref 5–15)
Anion gap: 9 (ref 5–15)
BUN: 22 mg/dL (ref 8–23)
BUN: 22 mg/dL (ref 8–23)
BUN: 21 mg/dL (ref 8–23)
BUN: 22 mg/dL (ref 8–23)
BUN: 20 mg/dL (ref 8–23)
BUN: 23 mg/dL (ref 8–23)
CO2: 25 mmol/L (ref 22–32)
CO2: 26 mmol/L (ref 22–32)
CO2: 25 mmol/L (ref 22–32)
CO2: 24 mmol/L (ref 22–32)
CO2: 24 mmol/L (ref 22–32)
CO2: 24 mmol/L (ref 22–32)
Calcium: 6.9 mg/dL — ABNORMAL LOW (ref 8.9–10.3)
Calcium: 7 mg/dL — ABNORMAL LOW (ref 8.9–10.3)
Calcium: 7.1 mg/dL — ABNORMAL LOW (ref 8.9–10.3)
Calcium: 7.1 mg/dL — ABNORMAL LOW (ref 8.9–10.3)
Calcium: 6.8 mg/dL — ABNORMAL LOW (ref 8.9–10.3)
Calcium: 7.2 mg/dL — ABNORMAL LOW (ref 8.9–10.3)
Chloride: 108 mmol/L (ref 98–111)
Chloride: 110 mmol/L (ref 98–111)
Chloride: 109 mmol/L (ref 98–111)
Chloride: 110 mmol/L (ref 98–111)
Chloride: 110 mmol/L (ref 98–111)
Chloride: 111 mmol/L (ref 98–111)
Creatinine, Ser: 0.8 mg/dL (ref 0.44–1.00)
Creatinine, Ser: 0.85 mg/dL (ref 0.44–1.00)
Creatinine, Ser: 0.8 mg/dL (ref 0.44–1.00)
Creatinine, Ser: 0.86 mg/dL (ref 0.44–1.00)
Creatinine, Ser: 0.88 mg/dL (ref 0.44–1.00)
Creatinine, Ser: 0.89 mg/dL (ref 0.44–1.00)
GFR calc Af Amer: >60 mL/min (ref >60)
GFR calc Af Amer: >60 mL/min (ref >60)
GFR calc Af Amer: >60 mL/min (ref >60)
GFR calc Af Amer: >60 mL/min (ref >60)
GFR calc Af Amer: >60 mL/min (ref >60)
GFR calc Af Amer: >60 mL/min (ref >60)
GFR calc non Af Amer: >60 mL/min (ref >60)
GFR calc non Af Amer: >60 mL/min (ref >60)
GFR calc non Af Amer: >60 mL/min (ref >60)
GFR calc non Af Amer: >60 mL/min (ref >60)
GFR calc non Af Amer: >60 mL/min (ref >60)
GFR calc non Af Amer: >60 mL/min (ref >60)
Glucose, Bld: 97 mg/dL (ref 70–99)
Glucose, Bld: 93 mg/dL (ref 70–99)
Glucose, Bld: 93 mg/dL (ref 70–99)
Glucose, Bld: 96 mg/dL (ref 70–99)
Glucose, Bld: 103 mg/dL — ABNORMAL HIGH (ref 70–99)
Glucose, Bld: 111 mg/dL — ABNORMAL HIGH (ref 70–99)
Potassium: 3.4 mmol/L — ABNORMAL LOW (ref 3.5–5.1)
Potassium: 3.4 mmol/L — ABNORMAL LOW (ref 3.5–5.1)
Potassium: 3.5 mmol/L (ref 3.5–5.1)
Potassium: 3.6 mmol/L (ref 3.5–5.1)
Potassium: 3.4 mmol/L — ABNORMAL LOW (ref 3.5–5.1)
Potassium: 3.7 mmol/L (ref 3.5–5.1)
Sodium: 141 mmol/L (ref 135–145)
Sodium: 143 mmol/L (ref 135–145)
Sodium: 142 mmol/L (ref 135–145)
Sodium: 143 mmol/L (ref 135–145)
Sodium: 144 mmol/L (ref 135–145)
Sodium: 144 mmol/L (ref 135–145)

## 2019-01-06 LAB — CBC
HCT: 31.3 % — ABNORMAL LOW (ref 36.0–46.0)
Hemoglobin: 10.2 g/dL — ABNORMAL LOW (ref 12.0–15.0)
MCH: 29.7 pg (ref 26.0–34.0)
MCHC: 32.6 g/dL (ref 30.0–36.0)
MCV: 91 fL (ref 80.0–100.0)
Platelets: 99 10*3/uL — ABNORMAL LOW (ref 150–400)
RBC: 3.44 MIL/uL — ABNORMAL LOW (ref 3.87–5.11)
RDW: 14.7 % (ref 11.5–15.5)
WBC: 8.1 10*3/uL (ref 4.0–10.5)
nRBC: 0.7 % — ABNORMAL HIGH (ref 0.0–0.2)

## 2019-01-06 LAB — POCT I-STAT 7, (LYTES, BLD GAS, ICA,H+H)
Acid-Base Excess: 3 mmol/L — ABNORMAL HIGH (ref 0.0–2.0)
Bicarbonate: 27.1 mmol/L (ref 20.0–28.0)
Calcium, Ion: 1.05 mmol/L — ABNORMAL LOW (ref 1.15–1.40)
HCT: 27 % — ABNORMAL LOW (ref 36.0–46.0)
Hemoglobin: 9.2 g/dL — ABNORMAL LOW (ref 12.0–15.0)
O2 Saturation: 99 %
Potassium: 3.3 mmol/L — ABNORMAL LOW (ref 3.5–5.1)
Sodium: 142 mmol/L (ref 135–145)
TCO2: 28 mmol/L (ref 22–32)
pCO2 arterial: 39.3 mmHg (ref 32.0–48.0)
pH, Arterial: 7.447 (ref 7.350–7.450)
pO2, Arterial: 111 mmHg — ABNORMAL HIGH (ref 83.0–108.0)

## 2019-01-06 LAB — GLUCOSE, CAPILLARY
Glucose-Capillary: 88 mg/dL (ref 70–99)
Glucose-Capillary: 93 mg/dL (ref 70–99)
Glucose-Capillary: 86 mg/dL (ref 70–99)
Glucose-Capillary: 95 mg/dL (ref 70–99)
Glucose-Capillary: 88 mg/dL (ref 70–99)
Glucose-Capillary: 95 mg/dL (ref 70–99)

## 2019-01-06 LAB — TRIGLYCERIDES: Triglycerides: 149 mg/dL (ref <150)

## 2019-01-06 MED ORDER — ALBUMIN HUMAN 5 % IV SOLN
INTRAVENOUS | Status: AC
  Filled 2019-01-06: qty 250

## 2019-01-06 MED ORDER — LACTATED RINGERS IV SOLN
INTRAVENOUS | Status: DC
  Administered 2019-01-06 – 2019-01-11 (×8): via INTRAVENOUS

## 2019-01-06 MED ORDER — SODIUM CHLORIDE 0.9% FLUSH
10.0000 mL | Freq: Two times a day (BID) | INTRAVENOUS | Status: DC
  Administered 2019-01-06 – 2019-01-07 (×3): 10 mL
  Administered 2019-01-08 – 2019-01-09 (×2): 20 mL
  Administered 2019-01-10: 30 mL
  Administered 2019-01-11 – 2019-01-19 (×17): 10 mL

## 2019-01-06 MED ORDER — ENOXAPARIN SODIUM 40 MG/0.4ML ~~LOC~~ SOLN
40.0000 mg | SUBCUTANEOUS | Status: DC
  Administered 2019-01-06 – 2019-01-07 (×2): 40 mg via SUBCUTANEOUS
  Filled 2019-01-06 (×2): qty 0.4

## 2019-01-06 MED ORDER — SODIUM CHLORIDE 0.9 % IV SOLN
INTRAVENOUS | Status: DC
  Filled 2019-01-06: qty 1000

## 2019-01-06 MED ORDER — POTASSIUM CHLORIDE 10 MEQ/50ML IV SOLN
10.0000 meq | INTRAVENOUS | Status: AC
  Administered 2019-01-06 (×2): 10 meq via INTRAVENOUS
  Filled 2019-01-06 (×2): qty 50

## 2019-01-06 MED ORDER — CHLORHEXIDINE GLUCONATE CLOTH 2 % EX PADS
6.0000 | MEDICATED_PAD | Freq: Every day | CUTANEOUS | Status: DC
  Administered 2019-01-06: 6 via TOPICAL

## 2019-01-06 MED ORDER — ALBUMIN HUMAN 5 % IV SOLN
25.0000 g | Freq: Once | INTRAVENOUS | Status: AC
  Administered 2019-01-06: 10:00:00 25 g via INTRAVENOUS
  Filled 2019-01-06: qty 250

## 2019-01-06 MED ORDER — SODIUM CHLORIDE 0.9% FLUSH: 10.0000 mL | INTRAVENOUS | Status: DC | PRN
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
## 2019-01-06 NOTE — Progress Notes (Signed)
Peripherally Inserted Central Catheter/Midline Placement  The IV Nurse has discussed with the patient and/or persons authorized to consent for the patient, the purpose of this procedure and the potential benefits and risks involved with this procedure.  The benefits include less needle sticks, lab draws from the catheter, and the patient may be discharged home with the catheter. Risks include, but not limited to, infection, bleeding, blood clot (thrombus formation), and puncture of an artery; nerve damage and irregular heartbeat and possibility to perform a PICC exchange if needed/ordered by physician.  Alternatives to this procedure were also discussed.  Bard Power PICC patient education guide, fact sheet on infection prevention and patient information card has been provided to patient /or left at bedside.    PICC/Midline Placement Documentation  PICC Triple Lumen 01/06/19 PICC Right Cephalic 39 cm 0 cm (Active)  Indication for Insertion or Continuance of Line Vasoactive infusions 01/06/19 1148  Exposed Catheter (cm) 0 cm 01/06/19 1148  Site Assessment Clean;Dry;Intact 01/06/19 1148  Lumen #1 Status Flushed;Blood return noted 01/06/19 1148  Lumen #2 Status Flushed;Blood return noted 01/06/19 1148  Lumen #3 Status Flushed;Blood return noted 01/06/19 1148  Dressing Type Transparent 01/06/19 1148  Dressing Status Clean;Dry;Intact;Antimicrobial disc in place 01/06/19 1148  Dressing Intervention New dressing 01/06/19 1148  Dressing Change Due 01/13/19 01/06/19 1148    Telephone consent signed by Daughter   Christella Noa Albarece 01/06/2019, 11:49 AM

## 2019-01-06 NOTE — Progress Notes (Signed)
Spoke with Mable Fill, RN concerning PICC order. Consent to be obtained from family. Patient has central venous access; PICC to replace present CVC for long term vasoactive medications. Every effort will be made to be placed today.
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
## 2019-01-07 ENCOUNTER — Inpatient Hospital Stay (HOSPITAL_COMMUNITY): Payer: Medicare Other

## 2019-01-07 DIAGNOSIS — R778 Other specified abnormalities of plasma proteins: Secondary | ICD-10-CM

## 2019-01-07 DIAGNOSIS — R7989 Other specified abnormal findings of blood chemistry: Secondary | ICD-10-CM

## 2019-01-07 LAB — BASIC METABOLIC PANEL
Anion gap: 11 (ref 5–15)
Anion gap: 13 (ref 5–15)
Anion gap: 12 (ref 5–15)
BUN: 22 mg/dL (ref 8–23)
BUN: 24 mg/dL — ABNORMAL HIGH (ref 8–23)
BUN: 23 mg/dL (ref 8–23)
CO2: 23 mmol/L (ref 22–32)
CO2: 22 mmol/L (ref 22–32)
CO2: 21 mmol/L — ABNORMAL LOW (ref 22–32)
Calcium: 7.3 mg/dL — ABNORMAL LOW (ref 8.9–10.3)
Calcium: 7.4 mg/dL — ABNORMAL LOW (ref 8.9–10.3)
Calcium: 7.1 mg/dL — ABNORMAL LOW (ref 8.9–10.3)
Chloride: 109 mmol/L (ref 98–111)
Chloride: 108 mmol/L (ref 98–111)
Chloride: 110 mmol/L (ref 98–111)
Creatinine, Ser: 0.98 mg/dL (ref 0.44–1.00)
Creatinine, Ser: 0.89 mg/dL (ref 0.44–1.00)
Creatinine, Ser: 0.93 mg/dL (ref 0.44–1.00)
GFR calc Af Amer: >60 mL/min (ref >60)
GFR calc Af Amer: >60 mL/min (ref >60)
GFR calc Af Amer: >60 mL/min (ref >60)
GFR calc non Af Amer: 54 mL/min — ABNORMAL LOW (ref >60)
GFR calc non Af Amer: >60 mL/min (ref >60)
GFR calc non Af Amer: 58 mL/min — ABNORMAL LOW (ref >60)
Glucose, Bld: 130 mg/dL — ABNORMAL HIGH (ref 70–99)
Glucose, Bld: 136 mg/dL — ABNORMAL HIGH (ref 70–99)
Glucose, Bld: 135 mg/dL — ABNORMAL HIGH (ref 70–99)
Potassium: 3.6 mmol/L (ref 3.5–5.1)
Potassium: 3.6 mmol/L (ref 3.5–5.1)
Potassium: 3.4 mmol/L — ABNORMAL LOW (ref 3.5–5.1)
Sodium: 143 mmol/L (ref 135–145)
Sodium: 143 mmol/L (ref 135–145)
Sodium: 143 mmol/L (ref 135–145)

## 2019-01-07 LAB — BPAM RBC
Blood Product Expiration Date: 202007152359
Blood Product Expiration Date: 202008052359
Blood Product Expiration Date: 202008062359
Blood Product Expiration Date: 202008072359
ISSUE DATE / TIME: 202007051336
ISSUE DATE / TIME: 202007081351
ISSUE DATE / TIME: 202007100509
ISSUE DATE / TIME: 202007100509
Unit Type and Rh: 5100
Unit Type and Rh: 5100
Unit Type and Rh: 5100
Unit Type and Rh: 5100

## 2019-01-07 LAB — CBC WITH DIFFERENTIAL/PLATELET
Abs Immature Granulocytes: 0.09 10*3/uL — ABNORMAL HIGH (ref 0.00–0.07)
Basophils Absolute: 0 10*3/uL (ref 0.0–0.1)
Basophils Relative: 0 %
Eosinophils Absolute: 0.1 10*3/uL (ref 0.0–0.5)
Eosinophils Relative: 1 %
HCT: 30.8 % — ABNORMAL LOW (ref 36.0–46.0)
Hemoglobin: 9.7 g/dL — ABNORMAL LOW (ref 12.0–15.0)
Immature Granulocytes: 1 %
Lymphocytes Relative: 8 %
Lymphs Abs: 0.6 10*3/uL — ABNORMAL LOW (ref 0.7–4.0)
MCH: 29.7 pg (ref 26.0–34.0)
MCHC: 31.5 g/dL (ref 30.0–36.0)
MCV: 94.2 fL (ref 80.0–100.0)
Monocytes Absolute: 0.7 10*3/uL (ref 0.1–1.0)
Monocytes Relative: 8 %
Neutro Abs: 6.4 10*3/uL (ref 1.7–7.7)
Neutrophils Relative %: 82 %
Platelets: 80 10*3/uL — ABNORMAL LOW (ref 150–400)
RBC: 3.27 MIL/uL — ABNORMAL LOW (ref 3.87–5.11)
RDW: 15.6 % — ABNORMAL HIGH (ref 11.5–15.5)
WBC: 7.9 10*3/uL (ref 4.0–10.5)
nRBC: 0.6 % — ABNORMAL HIGH (ref 0.0–0.2)

## 2019-01-07 LAB — TYPE AND SCREEN
ABO/RH(D): O POS
Antibody Screen: NEGATIVE
Unit division: 0
Unit division: 0
Unit division: 0
Unit division: 0

## 2019-01-07 LAB — GLUCOSE, CAPILLARY
Glucose-Capillary: 120 mg/dL — ABNORMAL HIGH (ref 70–99)
Glucose-Capillary: 126 mg/dL — ABNORMAL HIGH (ref 70–99)
Glucose-Capillary: 133 mg/dL — ABNORMAL HIGH (ref 70–99)
Glucose-Capillary: 134 mg/dL — ABNORMAL HIGH (ref 70–99)
Glucose-Capillary: 143 mg/dL — ABNORMAL HIGH (ref 70–99)
Glucose-Capillary: 122 mg/dL — ABNORMAL HIGH (ref 70–99)

## 2019-01-07 LAB — TRIGLYCERIDES: Triglycerides: 204 mg/dL — ABNORMAL HIGH (ref <150)

## 2019-01-07 NOTE — Progress Notes (Signed)
Daughter visited pt per Dr. Grandville Silos. Daughter questioned if pt had dentures at bedside. Dentures, 2 rings and watch in pt belonging bag. Daughter Lurena Joiner took the 2 rings and watch home. Pt nodded her head at daughter when she told her she was taking them. Dentures left at bedside

## 2019-01-07 NOTE — Progress Notes (Signed)
RT attempted to wean pt again per order.  Pt did not tolerate it, pt still too groggy to wean.  RT will continue to monitor.
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
## 2019-01-08 ENCOUNTER — Encounter (HOSPITAL_COMMUNITY): Payer: Self-pay | Admitting: General Surgery

## 2019-01-08 LAB — CBC
HCT: 30.6 % — ABNORMAL LOW (ref 36.0–46.0)
Hemoglobin: 9.6 g/dL — ABNORMAL LOW (ref 12.0–15.0)
MCH: 30 pg (ref 26.0–34.0)
MCHC: 31.4 g/dL (ref 30.0–36.0)
MCV: 95.6 fL (ref 80.0–100.0)
Platelets: 86 10*3/uL — ABNORMAL LOW (ref 150–400)
RBC: 3.2 MIL/uL — ABNORMAL LOW (ref 3.87–5.11)
RDW: 15.9 % — ABNORMAL HIGH (ref 11.5–15.5)
WBC: 5.9 10*3/uL (ref 4.0–10.5)
nRBC: 0.9 % — ABNORMAL HIGH (ref 0.0–0.2)

## 2019-01-08 LAB — GLUCOSE, CAPILLARY
Glucose-Capillary: 94 mg/dL (ref 70–99)
Glucose-Capillary: 115 mg/dL — ABNORMAL HIGH (ref 70–99)
Glucose-Capillary: 137 mg/dL — ABNORMAL HIGH (ref 70–99)
Glucose-Capillary: 141 mg/dL — ABNORMAL HIGH (ref 70–99)
Glucose-Capillary: 144 mg/dL — ABNORMAL HIGH (ref 70–99)
Glucose-Capillary: 141 mg/dL — ABNORMAL HIGH (ref 70–99)

## 2019-01-08 LAB — BASIC METABOLIC PANEL
Anion gap: 9 (ref 5–15)
BUN: 22 mg/dL (ref 8–23)
CO2: 25 mmol/L (ref 22–32)
Calcium: 7.5 mg/dL — ABNORMAL LOW (ref 8.9–10.3)
Chloride: 110 mmol/L (ref 98–111)
Creatinine, Ser: 0.79 mg/dL (ref 0.44–1.00)
GFR calc Af Amer: >60 mL/min (ref >60)
GFR calc non Af Amer: >60 mL/min (ref >60)
Glucose, Bld: 126 mg/dL — ABNORMAL HIGH (ref 70–99)
Potassium: 3.1 mmol/L — ABNORMAL LOW (ref 3.5–5.1)
Sodium: 144 mmol/L (ref 135–145)

## 2019-01-08 LAB — URINE CULTURE: Culture: NO GROWTH

## 2019-01-08 LAB — TRIGLYCERIDES: Triglycerides: 194 mg/dL — ABNORMAL HIGH (ref <150)

## 2019-01-08 MED ORDER — PIVOT 1.5 CAL PO LIQD
1000.0000 mL | ORAL | Status: DC
  Administered 2019-01-08: 12:00:00 1000 mL
  Filled 2019-01-08: qty 1000

## 2019-01-08 MED ORDER — CHLORHEXIDINE GLUCONATE CLOTH 2 % EX PADS: 6.0000 | MEDICATED_PAD | Freq: Every day | CUTANEOUS | Status: DC

## 2019-01-08 MED ORDER — FUROSEMIDE 10 MG/ML IJ SOLN
20.0000 mg | Freq: Once | INTRAMUSCULAR | Status: AC
  Administered 2019-01-08: 08:00:00 20 mg via INTRAVENOUS
  Filled 2019-01-08: qty 2

## 2019-01-08 MED ORDER — INSULIN GLARGINE 100 UNIT/ML ~~LOC~~ SOLN
5.0000 [IU] | Freq: Every day | SUBCUTANEOUS | Status: DC
  Administered 2019-01-08 – 2019-01-09 (×2): 5 [IU] via SUBCUTANEOUS
  Filled 2019-01-08 (×3): qty 0.05

## 2019-01-08 MED ORDER — VITAL HIGH PROTEIN PO LIQD
1000.0000 mL | ORAL | Status: AC
  Administered 2019-01-08: 1000 mL

## 2019-01-08 MED ORDER — POTASSIUM CHLORIDE 10 MEQ/50ML IV SOLN
10.0000 meq | INTRAVENOUS | Status: AC
  Administered 2019-01-08 (×4): 10 meq via INTRAVENOUS
  Filled 2019-01-08 (×4): qty 50
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
## 2019-01-08 NOTE — Progress Notes (Signed)
Patient ID: Heather Lyons, female   DOB: 1935/05/03, 84 y.o.   MRN: [id number] I called her daughter and updated her. She is hoping her R foot FXs may be able to be treated non-op.  Georganna Skeans, MD, MPH, FACS Trauma & General Surgery: [phone]
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
## 2019-01-08 NOTE — Consult Note (Signed)
Ideal Nurse ostomy follow up Patient receiving care in [place].  Patient is intubated and unable to receive teaching at this time for ostomy care. Stoma type/location: RLQ ileostomy.  Stoma is moist and beefy red. Stomal assessment/size: 1.5 inches Peristomal assessment: deferred.  Supplies are being requested by the [employer].  The unit does not carry ostomy supplies. Treatment options for stomal/peristomal skin: barrier ring Output: scant liquid brown in existing 2 piece, flat pouch  Ostomy pouching: 2pc. 2 3/4 inches Education provided: none Enrolled patient in [place] Start Discharge program: No Val Riles, RN, MSN, [employer], CNS-BC, pager [phone]
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
## 2019-01-09 ENCOUNTER — Inpatient Hospital Stay (HOSPITAL_COMMUNITY): Payer: Medicare Other

## 2019-01-09 DIAGNOSIS — E785 Hyperlipidemia, unspecified: Secondary | ICD-10-CM

## 2019-01-09 LAB — CBC
HCT: 32.4 % — ABNORMAL LOW (ref 36.0–46.0)
Hemoglobin: 10 g/dL — ABNORMAL LOW (ref 12.0–15.0)
MCH: 29.8 pg (ref 26.0–34.0)
MCHC: 30.9 g/dL (ref 30.0–36.0)
MCV: 96.4 fL (ref 80.0–100.0)
Platelets: 107 10*3/uL — ABNORMAL LOW (ref 150–400)
RBC: 3.36 MIL/uL — ABNORMAL LOW (ref 3.87–5.11)
RDW: 17.2 % — ABNORMAL HIGH (ref 11.5–15.5)
WBC: 5.3 10*3/uL (ref 4.0–10.5)
nRBC: 0.6 % — ABNORMAL HIGH (ref 0.0–0.2)

## 2019-01-09 LAB — GLUCOSE, CAPILLARY
Glucose-Capillary: 136 mg/dL — ABNORMAL HIGH (ref 70–99)
Glucose-Capillary: 135 mg/dL — ABNORMAL HIGH (ref 70–99)
Glucose-Capillary: 155 mg/dL — ABNORMAL HIGH (ref 70–99)
Glucose-Capillary: 176 mg/dL — ABNORMAL HIGH (ref 70–99)
Glucose-Capillary: 150 mg/dL — ABNORMAL HIGH (ref 70–99)
Glucose-Capillary: 238 mg/dL — ABNORMAL HIGH (ref 70–99)

## 2019-01-09 LAB — BASIC METABOLIC PANEL
Anion gap: 10 (ref 5–15)
BUN: 22 mg/dL (ref 8–23)
CO2: 24 mmol/L (ref 22–32)
Calcium: 7.8 mg/dL — ABNORMAL LOW (ref 8.9–10.3)
Chloride: 109 mmol/L (ref 98–111)
Creatinine, Ser: 0.75 mg/dL (ref 0.44–1.00)
GFR calc Af Amer: >60 mL/min (ref >60)
GFR calc non Af Amer: >60 mL/min (ref >60)
Glucose, Bld: 185 mg/dL — ABNORMAL HIGH (ref 70–99)
Potassium: 3.5 mmol/L (ref 3.5–5.1)
Sodium: 143 mmol/L (ref 135–145)

## 2019-01-09 LAB — CULTURE, BLOOD (ROUTINE X 2)
Culture: NO GROWTH
Culture: NO GROWTH
Special Requests: ADEQUATE
Special Requests: ADEQUATE

## 2019-01-09 LAB — TRIGLYCERIDES: Triglycerides: 209 mg/dL — ABNORMAL HIGH (ref <150)

## 2019-01-09 MED ORDER — CHLORHEXIDINE GLUCONATE CLOTH 2 % EX PADS
6.0000 | MEDICATED_PAD | Freq: Every day | CUTANEOUS | Status: DC
  Administered 2019-01-12 – 2019-01-19 (×7): 6 via TOPICAL

## 2019-01-09 MED ORDER — QUETIAPINE FUMARATE 25 MG PO TABS
25.0000 mg | ORAL_TABLET | Freq: Two times a day (BID) | ORAL | Status: DC
  Administered 2019-01-09 – 2019-01-19 (×21): 25 mg
  Filled 2019-01-09 (×21): qty 1

## 2019-01-09 MED ORDER — PIVOT 1.5 CAL PO LIQD
1000.0000 mL | ORAL | Status: DC
  Administered 2019-01-09 – 2019-01-10 (×2): 1000 mL

## 2019-01-09 MED ORDER — PANTOPRAZOLE SODIUM 40 MG PO PACK
40.0000 mg | PACK | Freq: Every day | ORAL | Status: DC
  Administered 2019-01-09 – 2019-01-19 (×11): 40 mg
  Filled 2019-01-09 (×11): qty 20

## 2019-01-09 MED ORDER — ENOXAPARIN SODIUM 40 MG/0.4ML ~~LOC~~ SOLN
40.0000 mg | SUBCUTANEOUS | Status: DC
  Administered 2019-01-09 – 2019-01-19 (×11): 40 mg via SUBCUTANEOUS
  Filled 2019-01-09 (×11): qty 0.4

## 2019-01-09 MED ORDER — POTASSIUM CHLORIDE 20 MEQ/15ML (10%) PO SOLN
40.0000 meq | Freq: Two times a day (BID) | ORAL | Status: AC
  Administered 2019-01-09 (×2): 40 meq
  Filled 2019-01-09 (×2): qty 30

## 2019-01-09 MED ORDER — PRO-STAT SUGAR FREE PO LIQD
30.0000 mL | Freq: Three times a day (TID) | ORAL | Status: DC
  Administered 2019-01-09 – 2019-01-11 (×7): 30 mL
  Filled 2019-01-09 (×7): qty 30

## 2019-01-09 MED ORDER — FUROSEMIDE 10 MG/ML IJ SOLN
40.0000 mg | Freq: Two times a day (BID) | INTRAMUSCULAR | Status: AC
  Administered 2019-01-09 (×2): 40 mg via INTRAVENOUS
  Filled 2019-01-09 (×2): qty 4

## 2019-01-09 MED ORDER — CHLORHEXIDINE GLUCONATE CLOTH 2 % EX PADS
6.0000 | MEDICATED_PAD | Freq: Every day | CUTANEOUS | Status: DC
  Administered 2019-01-11 (×2): 6 via TOPICAL
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
## 2019-01-09 NOTE — Consult Note (Signed)
[place] Nurse ostomy follow up Patient receiving care in [facility] 4N15.  The [place] team is aware of the new ileostomy.  Unit Secretary has now been able to obtain necessary supplies for the ostomy care; the pouches were unavailable yesterday. [place] nurses will follow along for continued ostomy support and teaching when the patient is able to participate. Val Riles, RN, MSN, CWOCN, CNS-BC, pager [phone]
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
## 2019-01-09 NOTE — Progress Notes (Signed)
Upon assessment, noticed patient oxygen saturation was 90% and the left lung sounds were diminished. When assessing left chest tube, noticed with movement and tip/tilting tube, a significant amount of serosanguinous fluid drained, 360 mL. Notified Trauma MD because chest tube was placed on water seal on day shift. MD wants to keep it on water seal as long as it drains with manipulation. Will continue to manipulate the tube to assist with drainage and continue to monitor.
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
## 2019-01-10 ENCOUNTER — Inpatient Hospital Stay (HOSPITAL_COMMUNITY): Payer: Medicare Other

## 2019-01-10 DIAGNOSIS — J9601 Acute respiratory failure with hypoxia: Secondary | ICD-10-CM

## 2019-01-10 DIAGNOSIS — J9602 Acute respiratory failure with hypercapnia: Secondary | ICD-10-CM

## 2019-01-10 LAB — CBC
HCT: 31.7 % — ABNORMAL LOW (ref 36.0–46.0)
Hemoglobin: 9.5 g/dL — ABNORMAL LOW (ref 12.0–15.0)
MCH: 29.4 pg (ref 26.0–34.0)
MCHC: 30 g/dL (ref 30.0–36.0)
MCV: 98.1 fL (ref 80.0–100.0)
Platelets: 132 10*3/uL — ABNORMAL LOW (ref 150–400)
RBC: 3.23 MIL/uL — ABNORMAL LOW (ref 3.87–5.11)
RDW: 17.4 % — ABNORMAL HIGH (ref 11.5–15.5)
WBC: 4.2 10*3/uL (ref 4.0–10.5)
nRBC: 0.5 % — ABNORMAL HIGH (ref 0.0–0.2)

## 2019-01-10 LAB — GLUCOSE, CAPILLARY
Glucose-Capillary: 202 mg/dL — ABNORMAL HIGH (ref 70–99)
Glucose-Capillary: 191 mg/dL — ABNORMAL HIGH (ref 70–99)
Glucose-Capillary: 230 mg/dL — ABNORMAL HIGH (ref 70–99)
Glucose-Capillary: 203 mg/dL — ABNORMAL HIGH (ref 70–99)
Glucose-Capillary: 204 mg/dL — ABNORMAL HIGH (ref 70–99)
Glucose-Capillary: 233 mg/dL — ABNORMAL HIGH (ref 70–99)

## 2019-01-10 LAB — BASIC METABOLIC PANEL
Anion gap: 7 (ref 5–15)
BUN: 26 mg/dL — ABNORMAL HIGH (ref 8–23)
CO2: 29 mmol/L (ref 22–32)
Calcium: 8 mg/dL — ABNORMAL LOW (ref 8.9–10.3)
Chloride: 108 mmol/L (ref 98–111)
Creatinine, Ser: 0.8 mg/dL (ref 0.44–1.00)
GFR calc Af Amer: >60 mL/min (ref >60)
GFR calc non Af Amer: >60 mL/min (ref >60)
Glucose, Bld: 225 mg/dL — ABNORMAL HIGH (ref 70–99)
Potassium: 3.6 mmol/L (ref 3.5–5.1)
Sodium: 144 mmol/L (ref 135–145)

## 2019-01-10 LAB — TRIGLYCERIDES: Triglycerides: 217 mg/dL — ABNORMAL HIGH (ref <150)

## 2019-01-10 MED ORDER — INSULIN GLARGINE 100 UNIT/ML ~~LOC~~ SOLN
15.0000 [IU] | Freq: Every day | SUBCUTANEOUS | Status: DC
  Administered 2019-01-10 – 2019-01-11 (×2): 15 [IU] via SUBCUTANEOUS
  Filled 2019-01-10 (×3): qty 0.15

## 2019-01-10 MED ORDER — PSYLLIUM 95 % PO PACK
1.0000 | PACK | Freq: Two times a day (BID) | ORAL | Status: DC
  Administered 2019-01-10: 13:00:00 1 via ORAL
  Filled 2019-01-10 (×2): qty 1

## 2019-01-10 MED ORDER — LOPERAMIDE HCL 1 MG/7.5ML PO SUSP
2.0000 mg | Freq: Three times a day (TID) | ORAL | Status: DC
  Administered 2019-01-10 – 2019-01-12 (×5): 2 mg
  Administered 2019-01-12: 1 mg
  Filled 2019-01-10 (×7): qty 15

## 2019-01-10 MED ORDER — LOPERAMIDE HCL 1 MG/7.5ML PO SUSP
2.0000 mg | Freq: Three times a day (TID) | ORAL | Status: DC
  Administered 2019-01-10: 2 mg via ORAL
  Filled 2019-01-10 (×2): qty 15

## 2019-01-10 MED ORDER — ALBUTEROL SULFATE (2.5 MG/3ML) 0.083% IN NEBU: 2.5000 mg | INHALATION_SOLUTION | Freq: Four times a day (QID) | RESPIRATORY_TRACT | Status: DC | PRN

## 2019-01-10 MED ORDER — IPRATROPIUM-ALBUTEROL 0.5-2.5 (3) MG/3ML IN SOLN
3.0000 mL | Freq: Three times a day (TID) | RESPIRATORY_TRACT | Status: DC
  Administered 2019-01-11 – 2019-01-18 (×22): 3 mL via RESPIRATORY_TRACT
  Filled 2019-01-10 (×22): qty 3

## 2019-01-10 MED ORDER — POTASSIUM CHLORIDE 10 MEQ/100ML IV SOLN
10.0000 meq | INTRAVENOUS | Status: AC
  Administered 2019-01-10 (×4): 10 meq via INTRAVENOUS
  Filled 2019-01-10 (×4): qty 100

## 2019-01-10 MED ORDER — PSYLLIUM 95 % PO PACK
1.0000 | PACK | Freq: Two times a day (BID) | ORAL | Status: DC
  Administered 2019-01-10 – 2019-01-11 (×2): 1
  Filled 2019-01-10 (×4): qty 1
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
## 2019-01-10 NOTE — Progress Notes (Signed)
Pt's daughter called twice today.  She has not spoken to a physician since 01/08/19.  I answered her appropriate questions to the best of my ability. She requested a MD call her tomorrow.

## 2019-01-10 NOTE — Progress Notes (Signed)
Inpatient Diabetes Program Recommendations  AACE/ADA: New Consensus Statement on Inpatient Glycemic Control (2015)  Target Ranges:  Prepandial:   less than 140 mg/dL      Peak postprandial:   less than 180 mg/dL (1-2 hours)      Critically ill patients:  140 - 180 mg/dL   Lab Results  Component Value Date   GLUCAP 191 (H) 01/10/2019   HGBA1C 9.5 (H) 01/02/2019    Review of Glycemic Control Results for Heather Lyons, Heather Lyons (MRN [id number]) as of 01/10/2019 09:25  Ref. Range 01/09/2019 19:39 01/09/2019 23:10 01/10/2019 03:09 01/10/2019 07:57  Glucose-Capillary Latest Ref Range: 70 - 99 mg/dL 150 (H) 238 (H) 202 (H) 191 (H)   Diabetes history:DM2 Outpatient Diabetes medications:Levemir 45 units QHS, Glipizide XL 20 mg daily, Ozempic 0.25 mg Qweek Current orders for Inpatient glycemic control:Lantus 5 units daily, Novolog 0-20 units Q4H  Inpatient Diabetes Program Recommendations:  Consider increasing Lantus to 8 units QD.   Thanks, Bronson Curb, MSN, RNC-OB Diabetes Coordinator [phone] (8a-5p)
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
## 2019-01-10 NOTE — Progress Notes (Signed)
Nonop tx R distal radius fx and R thumb distal phalanx fx  Patient intubated, but will flex/ext right hand digits to command.  Digits warm and pink. Limb swollen, with some skin irritation prox to splint.  R UE splint slightly disheveled, but still adequate.  Will get f/u xrays of right wrist and right thumb to evaluate for possible fracture displacement. Continue FA-based thumb spica splint.  Micheline Rough, MD Hand Surgery

## 2019-01-10 NOTE — Consult Note (Signed)
[place] Nurse ostomy follow up Patient receiving care in [facility] 4N15.  Primary care RN present in room at time of pouch change. Stoma type/location: RLQ, ileostomy, dark pink, moist, sutures intact, no sloughing, slightly budded Stomal assessment/size: 1 3/8 inches oval Peristomal assessment: intact.  Small MARSI skin tear when existing barrier removed along the lower border.   Treatment options for stomal/peristomal skin: barrier ring and high output pouch Output:  High output of brown effluent  Ostomy pouching: 2pc. 2 3/4 inches barrier and high output pouch with barrier ring. Education provided: none.  Patient intubated, sedated. Unable to receive teaching. Enrolled patient in [place] Start Discharge program: No  Val Riles, RN, MSN, [facility], CNS-BC, pager [phone]

## 2019-01-11 ENCOUNTER — Inpatient Hospital Stay (HOSPITAL_COMMUNITY): Payer: Medicare Other

## 2019-01-11 LAB — GLUCOSE, CAPILLARY
Glucose-Capillary: 218 mg/dL — ABNORMAL HIGH (ref 70–99)
Glucose-Capillary: 194 mg/dL — ABNORMAL HIGH (ref 70–99)
Glucose-Capillary: 217 mg/dL — ABNORMAL HIGH (ref 70–99)
Glucose-Capillary: 212 mg/dL — ABNORMAL HIGH (ref 70–99)
Glucose-Capillary: 202 mg/dL — ABNORMAL HIGH (ref 70–99)
Glucose-Capillary: 168 mg/dL — ABNORMAL HIGH (ref 70–99)

## 2019-01-11 LAB — BASIC METABOLIC PANEL
Anion gap: 11 (ref 5–15)
BUN: 29 mg/dL — ABNORMAL HIGH (ref 8–23)
CO2: 29 mmol/L (ref 22–32)
Calcium: 8.4 mg/dL — ABNORMAL LOW (ref 8.9–10.3)
Chloride: 108 mmol/L (ref 98–111)
Creatinine, Ser: 0.8 mg/dL (ref 0.44–1.00)
GFR calc Af Amer: >60 mL/min (ref >60)
GFR calc non Af Amer: >60 mL/min (ref >60)
Glucose, Bld: 220 mg/dL — ABNORMAL HIGH (ref 70–99)
Potassium: 4 mmol/L (ref 3.5–5.1)
Sodium: 148 mmol/L — ABNORMAL HIGH (ref 135–145)

## 2019-01-11 LAB — CBC WITH DIFFERENTIAL/PLATELET
Abs Immature Granulocytes: 0.02 10*3/uL (ref 0.00–0.07)
Basophils Absolute: 0 10*3/uL (ref 0.0–0.1)
Basophils Relative: 1 %
Eosinophils Absolute: 0.1 10*3/uL (ref 0.0–0.5)
Eosinophils Relative: 2 %
HCT: 32.7 % — ABNORMAL LOW (ref 36.0–46.0)
Hemoglobin: 9.6 g/dL — ABNORMAL LOW (ref 12.0–15.0)
Immature Granulocytes: 0 %
Lymphocytes Relative: 12 %
Lymphs Abs: 0.6 10*3/uL — ABNORMAL LOW (ref 0.7–4.0)
MCH: 29.3 pg (ref 26.0–34.0)
MCHC: 29.4 g/dL — ABNORMAL LOW (ref 30.0–36.0)
MCV: 99.7 fL (ref 80.0–100.0)
Monocytes Absolute: 0.4 10*3/uL (ref 0.1–1.0)
Monocytes Relative: 8 %
Neutro Abs: 4 10*3/uL (ref 1.7–7.7)
Neutrophils Relative %: 77 %
Platelets: 165 10*3/uL (ref 150–400)
RBC: 3.28 MIL/uL — ABNORMAL LOW (ref 3.87–5.11)
RDW: 17.2 % — ABNORMAL HIGH (ref 11.5–15.5)
WBC: 5.1 10*3/uL (ref 4.0–10.5)
nRBC: 0.8 % — ABNORMAL HIGH (ref 0.0–0.2)

## 2019-01-11 LAB — TRIGLYCERIDES: Triglycerides: 189 mg/dL — ABNORMAL HIGH (ref <150)

## 2019-01-11 MED ORDER — ACETAMINOPHEN 160 MG/5ML PO SOLN
650.0000 mg | Freq: Four times a day (QID) | ORAL | Status: DC | PRN
  Administered 2019-01-13 – 2019-01-15 (×4): 650 mg
  Filled 2019-01-11 (×4): qty 20.3

## 2019-01-11 MED ORDER — SODIUM CHLORIDE 0.9 % IV SOLN
2.0000 g | Freq: Three times a day (TID) | INTRAVENOUS | Status: DC
  Administered 2019-01-11 – 2019-01-15 (×11): 2 g via INTRAVENOUS
  Filled 2019-01-11 (×14): qty 2

## 2019-01-11 MED ORDER — FUROSEMIDE 10 MG/ML IJ SOLN
40.0000 mg | Freq: Once | INTRAMUSCULAR | Status: AC
  Administered 2019-01-11: 40 mg via INTRAVENOUS
  Filled 2019-01-11: qty 4

## 2019-01-11 MED ORDER — OXYCODONE HCL 5 MG/5ML PO SOLN
5.0000 mg | ORAL | Status: DC | PRN
  Administered 2019-01-11 (×2): 10 mg
  Administered 2019-01-12: 5 mg
  Administered 2019-01-12 – 2019-01-16 (×10): 10 mg
  Filled 2019-01-11 (×4): qty 10
  Filled 2019-01-11: qty 5
  Filled 2019-01-11 (×8): qty 10

## 2019-01-11 MED ORDER — PRO-STAT SUGAR FREE PO LIQD
60.0000 mL | Freq: Two times a day (BID) | ORAL | Status: DC
  Filled 2019-01-11: qty 60
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
## 2019-01-11 NOTE — Progress Notes (Signed)
Pt vomited x 2. NG placed to LIWS got out 495ml yellow vomit. Pt sats in 80's. Suctioned numerous times. RT called and adjusted FIO2 to 80%. Zofran given. Dr. Redmond Pulling paged. Awaiting return call. Will continue to monitor.
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
## 2019-01-11 NOTE — Progress Notes (Signed)
Spoke with Dr. Greer Pickerel regarding patient's vomiting episode, earlier. Chest and Abdominal xrays ordered and instructed to keep tube feeding off until revaluated in the morning.

## 2019-01-11 NOTE — Progress Notes (Signed)
Patient has been having vomiting episodes this afternoon and unable to maintain O2 sats. RT has increased FIO2 to 80% at this time. Patients ETT has been suctioned out at this time. RN to give medicine for nausea.
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
## 2019-01-12 ENCOUNTER — Inpatient Hospital Stay (HOSPITAL_COMMUNITY): Payer: Medicare Other

## 2019-01-12 DIAGNOSIS — J9690 Respiratory failure, unspecified, unspecified whether with hypoxia or hypercapnia: Secondary | ICD-10-CM

## 2019-01-12 DIAGNOSIS — E785 Hyperlipidemia, unspecified: Secondary | ICD-10-CM

## 2019-01-12 DIAGNOSIS — S299XXA Unspecified injury of thorax, initial encounter: Secondary | ICD-10-CM

## 2019-01-12 DIAGNOSIS — S270XXA Traumatic pneumothorax, initial encounter: Secondary | ICD-10-CM

## 2019-01-12 DIAGNOSIS — E1169 Type 2 diabetes mellitus with other specified complication: Secondary | ICD-10-CM

## 2019-01-12 DIAGNOSIS — K559 Vascular disorder of intestine, unspecified: Secondary | ICD-10-CM

## 2019-01-12 LAB — CBC
HCT: 32.5 % — ABNORMAL LOW (ref 36.0–46.0)
Hemoglobin: 9.6 g/dL — ABNORMAL LOW (ref 12.0–15.0)
MCH: 29.4 pg (ref 26.0–34.0)
MCHC: 29.5 g/dL — ABNORMAL LOW (ref 30.0–36.0)
MCV: 99.4 fL (ref 80.0–100.0)
Platelets: 208 10*3/uL (ref 150–400)
RBC: 3.27 MIL/uL — ABNORMAL LOW (ref 3.87–5.11)
RDW: 17.3 % — ABNORMAL HIGH (ref 11.5–15.5)
WBC: 10.1 10*3/uL (ref 4.0–10.5)
nRBC: 0 % (ref 0.0–0.2)

## 2019-01-12 LAB — BASIC METABOLIC PANEL
Anion gap: 9 (ref 5–15)
BUN: 26 mg/dL — ABNORMAL HIGH (ref 8–23)
CO2: 31 mmol/L (ref 22–32)
Calcium: 8.7 mg/dL — ABNORMAL LOW (ref 8.9–10.3)
Chloride: 106 mmol/L (ref 98–111)
Creatinine, Ser: 0.77 mg/dL (ref 0.44–1.00)
GFR calc Af Amer: >60 mL/min (ref >60)
GFR calc non Af Amer: >60 mL/min (ref >60)
Glucose, Bld: 206 mg/dL — ABNORMAL HIGH (ref 70–99)
Potassium: 3.7 mmol/L (ref 3.5–5.1)
Sodium: 146 mmol/L — ABNORMAL HIGH (ref 135–145)

## 2019-01-12 LAB — TRIGLYCERIDES: Triglycerides: 160 mg/dL — ABNORMAL HIGH (ref <150)

## 2019-01-12 LAB — GLUCOSE, CAPILLARY
Glucose-Capillary: 170 mg/dL — ABNORMAL HIGH (ref 70–99)
Glucose-Capillary: 190 mg/dL — ABNORMAL HIGH (ref 70–99)
Glucose-Capillary: 164 mg/dL — ABNORMAL HIGH (ref 70–99)
Glucose-Capillary: 151 mg/dL — ABNORMAL HIGH (ref 70–99)
Glucose-Capillary: 157 mg/dL — ABNORMAL HIGH (ref 70–99)
Glucose-Capillary: 138 mg/dL — ABNORMAL HIGH (ref 70–99)

## 2019-01-12 LAB — URINE CULTURE: Culture: NO GROWTH

## 2019-01-12 MED ORDER — PSYLLIUM 95 % PO PACK
1.0000 | PACK | Freq: Two times a day (BID) | ORAL | Status: DC
  Administered 2019-01-13 – 2019-01-19 (×11): 1
  Filled 2019-01-12 (×14): qty 1

## 2019-01-12 MED ORDER — FUROSEMIDE 10 MG/ML IJ SOLN
40.0000 mg | Freq: Two times a day (BID) | INTRAMUSCULAR | Status: DC
  Administered 2019-01-12 – 2019-01-19 (×15): 40 mg via INTRAVENOUS
  Filled 2019-01-12 (×15): qty 4

## 2019-01-12 MED ORDER — LORAZEPAM 2 MG/ML IJ SOLN
1.0000 mg | Freq: Four times a day (QID) | INTRAMUSCULAR | Status: DC | PRN
  Administered 2019-01-12: 1 mg via INTRAVENOUS
  Filled 2019-01-12: qty 1

## 2019-01-12 MED ORDER — FENTANYL CITRATE (PF) 100 MCG/2ML IJ SOLN
25.0000 ug | INTRAMUSCULAR | Status: DC | PRN
  Administered 2019-01-12 – 2019-01-18 (×8): 50 ug via INTRAVENOUS
  Filled 2019-01-12 (×8): qty 2

## 2019-01-12 MED ORDER — PRO-STAT SUGAR FREE PO LIQD
60.0000 mL | Freq: Two times a day (BID) | ORAL | Status: DC
  Administered 2019-01-13 – 2019-01-16 (×7): 60 mL
  Filled 2019-01-12 (×7): qty 60

## 2019-01-12 MED ORDER — GUAIFENESIN 100 MG/5ML PO SOLN: 5.0000 mL | ORAL | Status: DC | PRN

## 2019-01-12 MED ORDER — LOPERAMIDE HCL 1 MG/7.5ML PO SUSP
1.0000 mg | Freq: Three times a day (TID) | ORAL | Status: DC
  Administered 2019-01-13 – 2019-01-14 (×4): 1 mg via ORAL
  Filled 2019-01-12 (×4): qty 15

## 2019-01-12 MED ORDER — LORAZEPAM 2 MG/ML IJ SOLN
1.0000 mg | INTRAMUSCULAR | Status: DC | PRN
  Administered 2019-01-13 – 2019-01-18 (×9): 1 mg via INTRAVENOUS
  Filled 2019-01-12 (×10): qty 1

## 2019-01-12 MED ORDER — SODIUM CHLORIDE 0.9 % IV SOLN
INTRAVENOUS | Status: DC | PRN
  Administered 2019-01-12: 22:00:00 250 mL via INTRAVENOUS

## 2019-01-12 NOTE — Progress Notes (Signed)
Her daughter, Jacqlyn Larsen, asked for the rounding MD to call her Saturday morning, please.
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
## 2019-01-13 ENCOUNTER — Inpatient Hospital Stay (HOSPITAL_COMMUNITY): Payer: Medicare Other

## 2019-01-13 LAB — GLUCOSE, CAPILLARY
Glucose-Capillary: 137 mg/dL — ABNORMAL HIGH (ref 70–99)
Glucose-Capillary: 138 mg/dL — ABNORMAL HIGH (ref 70–99)
Glucose-Capillary: 189 mg/dL — ABNORMAL HIGH (ref 70–99)
Glucose-Capillary: 171 mg/dL — ABNORMAL HIGH (ref 70–99)
Glucose-Capillary: 161 mg/dL — ABNORMAL HIGH (ref 70–99)
Glucose-Capillary: 199 mg/dL — ABNORMAL HIGH (ref 70–99)

## 2019-01-13 LAB — BASIC METABOLIC PANEL
Anion gap: 12 (ref 5–15)
BUN: 25 mg/dL — ABNORMAL HIGH (ref 8–23)
CO2: 30 mmol/L (ref 22–32)
Calcium: 8.6 mg/dL — ABNORMAL LOW (ref 8.9–10.3)
Chloride: 103 mmol/L (ref 98–111)
Creatinine, Ser: 0.76 mg/dL (ref 0.44–1.00)
GFR calc Af Amer: >60 mL/min (ref >60)
GFR calc non Af Amer: >60 mL/min (ref >60)
Glucose, Bld: 188 mg/dL — ABNORMAL HIGH (ref 70–99)
Potassium: 3.5 mmol/L (ref 3.5–5.1)
Sodium: 145 mmol/L (ref 135–145)

## 2019-01-13 LAB — CULTURE, RESPIRATORY W GRAM STAIN: Culture: NORMAL

## 2019-01-13 LAB — CBC
HCT: 32.7 % — ABNORMAL LOW (ref 36.0–46.0)
Hemoglobin: 10 g/dL — ABNORMAL LOW (ref 12.0–15.0)
MCH: 29.8 pg (ref 26.0–34.0)
MCHC: 30.6 g/dL (ref 30.0–36.0)
MCV: 97.3 fL (ref 80.0–100.0)
Platelets: 237 10*3/uL (ref 150–400)
RBC: 3.36 MIL/uL — ABNORMAL LOW (ref 3.87–5.11)
RDW: 17.2 % — ABNORMAL HIGH (ref 11.5–15.5)
WBC: 11.8 10*3/uL — ABNORMAL HIGH (ref 4.0–10.5)
nRBC: 0 % (ref 0.0–0.2)

## 2019-01-13 LAB — TRIGLYCERIDES: Triglycerides: 142 mg/dL (ref <150)

## 2019-01-13 MED ORDER — POTASSIUM CHLORIDE CRYS ER 20 MEQ PO TBCR
40.0000 meq | EXTENDED_RELEASE_TABLET | Freq: Once | ORAL | Status: AC
  Administered 2019-01-13: 14:00:00 40 meq via ORAL
  Filled 2019-01-13: qty 2

## 2019-01-13 MED ORDER — CHLORHEXIDINE GLUCONATE 0.12 % MT SOLN
OROMUCOSAL | Status: AC
  Administered 2019-01-13: 15 mL via OROMUCOSAL
  Filled 2019-01-13: qty 15

## 2019-01-13 MED ORDER — PIVOT 1.5 CAL PO LIQD
1000.0000 mL | ORAL | Status: DC
  Administered 2019-01-13: 12:00:00 1000 mL
  Filled 2019-01-13: qty 1000
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
## 2019-01-13 NOTE — Progress Notes (Signed)
Assisted tele visit to patient with daughter.  Arlee Bossard Samson, RN  
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
## 2019-01-13 NOTE — Progress Notes (Signed)
Orthopedic Tech Progress Note Patient Details:  Heather Lyons 11-22-1934 [id number] THERAPY paged me requesting a PRAFO BOOT with kick stand for patient.Manson Passey Devices Type of Ortho Device: Prafo boot/shoe Ortho Device/Splint Location: LLE Ortho Device/Splint Interventions: Adjustment, Application, Ordered   Post Interventions Patient Tolerated: Well Instructions Provided: Care of device, Adjustment of device   Janit Pagan 01/13/2019, 2:06 PM

## 2019-01-14 ENCOUNTER — Inpatient Hospital Stay (HOSPITAL_COMMUNITY): Payer: Medicare Other | Admitting: Certified Registered"

## 2019-01-14 ENCOUNTER — Inpatient Hospital Stay (HOSPITAL_COMMUNITY): Payer: Medicare Other

## 2019-01-14 LAB — POCT I-STAT 7, (LYTES, BLD GAS, ICA,H+H)
Acid-Base Excess: 12 mmol/L — ABNORMAL HIGH (ref 0.0–2.0)
Bicarbonate: 38.1 mmol/L — ABNORMAL HIGH (ref 20.0–28.0)
Calcium, Ion: 1.23 mmol/L (ref 1.15–1.40)
HCT: 35 % — ABNORMAL LOW (ref 36.0–46.0)
Hemoglobin: 11.9 g/dL — ABNORMAL LOW (ref 12.0–15.0)
O2 Saturation: 95 %
Patient temperature: 98.6
Potassium: 2.7 mmol/L — CL (ref 3.5–5.1)
Sodium: 148 mmol/L — ABNORMAL HIGH (ref 135–145)
TCO2: 40 mmol/L — ABNORMAL HIGH (ref 22–32)
pCO2 arterial: 56 mmHg — ABNORMAL HIGH (ref 32.0–48.0)
pH, Arterial: 7.44 (ref 7.350–7.450)
pO2, Arterial: 77 mmHg — ABNORMAL LOW (ref 83.0–108.0)

## 2019-01-14 LAB — GLUCOSE, CAPILLARY
Glucose-Capillary: 175 mg/dL — ABNORMAL HIGH (ref 70–99)
Glucose-Capillary: 211 mg/dL — ABNORMAL HIGH (ref 70–99)
Glucose-Capillary: 215 mg/dL — ABNORMAL HIGH (ref 70–99)
Glucose-Capillary: 228 mg/dL — ABNORMAL HIGH (ref 70–99)
Glucose-Capillary: 236 mg/dL — ABNORMAL HIGH (ref 70–99)
Glucose-Capillary: 240 mg/dL — ABNORMAL HIGH (ref 70–99)

## 2019-01-14 MED ORDER — INSULIN DETEMIR 100 UNIT/ML ~~LOC~~ SOLN
15.0000 [IU] | Freq: Every day | SUBCUTANEOUS | Status: DC
  Administered 2019-01-14 – 2019-01-16 (×3): 15 [IU] via SUBCUTANEOUS
  Filled 2019-01-14 (×4): qty 0.15

## 2019-01-14 MED ORDER — LOPERAMIDE HCL 1 MG/7.5ML PO SUSP
2.0000 mg | Freq: Three times a day (TID) | ORAL | Status: DC
  Filled 2019-01-14: qty 15

## 2019-01-14 MED ORDER — INSULIN DETEMIR 100 UNIT/ML ~~LOC~~ SOLN
45.0000 [IU] | Freq: Every day | SUBCUTANEOUS | Status: DC
  Filled 2019-01-14: qty 0.45

## 2019-01-14 MED ORDER — INSULIN DETEMIR 100 UNIT/ML ~~LOC~~ SOLN
15.0000 [IU] | Freq: Every day | SUBCUTANEOUS | Status: DC
  Filled 2019-01-14: qty 0.15

## 2019-01-14 MED ORDER — SUCCINYLCHOLINE CHLORIDE 20 MG/ML IJ SOLN
INTRAMUSCULAR | Status: DC | PRN
  Administered 2019-01-14: 100 mg via INTRAVENOUS

## 2019-01-14 MED ORDER — ETOMIDATE 2 MG/ML IV SOLN
INTRAVENOUS | Status: DC | PRN
  Administered 2019-01-14: 14 mg via INTRAVENOUS

## 2019-01-14 MED ORDER — LOPERAMIDE HCL 1 MG/7.5ML PO SUSP
2.0000 mg | Freq: Three times a day (TID) | ORAL | Status: DC
  Administered 2019-01-14 – 2019-01-16 (×4): 2 mg
  Filled 2019-01-14 (×4): qty 15

## 2019-01-14 NOTE — Anesthesia Procedure Notes (Signed)
Procedure Name: Intubation Date/Time: 01/14/2019 11:20 PM Performed by: Claris Che, CRNA Pre-anesthesia Checklist: Patient identified, Emergency Drugs available, Suction available, Patient being monitored and Timeout performed Patient Re-evaluated:Patient Re-evaluated prior to induction Oxygen Delivery Method: Ambu bag Preoxygenation: Pre-oxygenation with 100% oxygen Induction Type: Rapid sequence and Cricoid Pressure applied Laryngoscope Size: Mac and 3 Grade View: Grade II Tube type: Oral Tube size: 7.5 mm Number of attempts: 1 Airway Equipment and Method: Stylet Placement Confirmation: ETT inserted through vocal cords under direct vision,  CO2 detector and breath sounds checked- equal and bilateral Secured at: 22 cm Tube secured with: Tape Dental Injury: Teeth and Oropharynx as per pre-operative assessment

## 2019-01-14 NOTE — Transfer of Care (Signed)
OOD Intubation 

## 2019-01-14 NOTE — Progress Notes (Signed)
Left chest tube was dislodged and found out by RN  CXR showed no L ptx  Output had gone down significantly since diuresis; will plan to keep out and monitor for now  Sharon Mt. Dema Severin, M.D. [facility]

## 2019-01-14 NOTE — Progress Notes (Signed)
Chest tube found in bed during 1200 turn. Unclear on when this happened exactly. Patient's oxygen dropped from mid 90s to 83% around 1100. FiO2 increased from 40-50% with associated increase in oxygen sats.  Patient nods head no when asked if she is SOB. Dr. Dema Severin paged and made aware. Stat CXR performed, see result review.

## 2019-01-14 NOTE — Progress Notes (Signed)
Assisted tele visit to patient with son.  Shalia Bartko P, RN  

## 2019-01-14 NOTE — Progress Notes (Signed)
Patient self extubated at 2010.  Spo2 decreased to 77%.  RT and RN removed ETT holder and provided bag mask valve support at 15L to increase spo2 to 94%, then placed patient on 100% nonrebreather.  Patient is coughing up thick tan secretions and is maintaining spo2 in the 90s with RR 26.  BIPAP is on standby if needed. RT will continue to monitor.
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
## 2019-01-14 NOTE — Progress Notes (Addendum)
Self extubated this evening. She has done ok - on NRB but with RR in the 20s-30s. ABG shows PCO2 of 56 now and O2 saturations in the 91-92 range. Will plan to reintubate before her ventilatory failure worsens  Sharon Mt. Dema Severin, M.D. [facility]
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
## 2019-01-15 ENCOUNTER — Inpatient Hospital Stay (HOSPITAL_COMMUNITY): Payer: Medicare Other

## 2019-01-15 DIAGNOSIS — R9431 Abnormal electrocardiogram [ECG] [EKG]: Secondary | ICD-10-CM

## 2019-01-15 LAB — BASIC METABOLIC PANEL
Anion gap: 10 (ref 5–15)
Anion gap: 8 (ref 5–15)
BUN: 34 mg/dL — ABNORMAL HIGH (ref 8–23)
BUN: 35 mg/dL — ABNORMAL HIGH (ref 8–23)
CO2: 35 mmol/L — ABNORMAL HIGH (ref 22–32)
CO2: 34 mmol/L — ABNORMAL HIGH (ref 22–32)
Calcium: 8.4 mg/dL — ABNORMAL LOW (ref 8.9–10.3)
Calcium: 8.2 mg/dL — ABNORMAL LOW (ref 8.9–10.3)
Chloride: 102 mmol/L (ref 98–111)
Chloride: 105 mmol/L (ref 98–111)
Creatinine, Ser: 0.79 mg/dL (ref 0.44–1.00)
Creatinine, Ser: 0.81 mg/dL (ref 0.44–1.00)
GFR calc Af Amer: >60 mL/min (ref >60)
GFR calc Af Amer: >60 mL/min (ref >60)
GFR calc non Af Amer: >60 mL/min (ref >60)
GFR calc non Af Amer: >60 mL/min (ref >60)
Glucose, Bld: 144 mg/dL — ABNORMAL HIGH (ref 70–99)
Glucose, Bld: 223 mg/dL — ABNORMAL HIGH (ref 70–99)
Potassium: 2.5 mmol/L — CL (ref 3.5–5.1)
Potassium: 4.3 mmol/L (ref 3.5–5.1)
Sodium: 147 mmol/L — ABNORMAL HIGH (ref 135–145)
Sodium: 147 mmol/L — ABNORMAL HIGH (ref 135–145)

## 2019-01-15 LAB — CBC WITH DIFFERENTIAL/PLATELET
Abs Immature Granulocytes: 0.04 10*3/uL (ref 0.00–0.07)
Basophils Absolute: 0 10*3/uL (ref 0.0–0.1)
Basophils Relative: 0 %
Eosinophils Absolute: 0.2 10*3/uL (ref 0.0–0.5)
Eosinophils Relative: 2 %
HCT: 30.7 % — ABNORMAL LOW (ref 36.0–46.0)
Hemoglobin: 9 g/dL — ABNORMAL LOW (ref 12.0–15.0)
Immature Granulocytes: 1 %
Lymphocytes Relative: 13 %
Lymphs Abs: 1 10*3/uL (ref 0.7–4.0)
MCH: 28.8 pg (ref 26.0–34.0)
MCHC: 29.3 g/dL — ABNORMAL LOW (ref 30.0–36.0)
MCV: 98.1 fL (ref 80.0–100.0)
Monocytes Absolute: 0.5 10*3/uL (ref 0.1–1.0)
Monocytes Relative: 6 %
Neutro Abs: 6.4 10*3/uL (ref 1.7–7.7)
Neutrophils Relative %: 78 %
Platelets: 306 10*3/uL (ref 150–400)
RBC: 3.13 MIL/uL — ABNORMAL LOW (ref 3.87–5.11)
RDW: 16.4 % — ABNORMAL HIGH (ref 11.5–15.5)
WBC: 8.2 10*3/uL (ref 4.0–10.5)
nRBC: 0 % (ref 0.0–0.2)

## 2019-01-15 LAB — POCT I-STAT 7, (LYTES, BLD GAS, ICA,H+H)
Acid-Base Excess: 15 mmol/L — ABNORMAL HIGH (ref 0.0–2.0)
Bicarbonate: 40.1 mmol/L — ABNORMAL HIGH (ref 20.0–28.0)
Calcium, Ion: 1.21 mmol/L (ref 1.15–1.40)
HCT: 45 % (ref 36.0–46.0)
Hemoglobin: 15.3 g/dL — ABNORMAL HIGH (ref 12.0–15.0)
O2 Saturation: 99 %
Patient temperature: 98.6
Potassium: 2.5 mmol/L — CL (ref 3.5–5.1)
Sodium: 147 mmol/L — ABNORMAL HIGH (ref 135–145)
TCO2: 41 mmol/L — ABNORMAL HIGH (ref 22–32)
pCO2 arterial: 46.1 mmHg (ref 32.0–48.0)
pH, Arterial: 7.548 — ABNORMAL HIGH (ref 7.350–7.450)
pO2, Arterial: 139 mmHg — ABNORMAL HIGH (ref 83.0–108.0)

## 2019-01-15 LAB — GLUCOSE, CAPILLARY
Glucose-Capillary: 143 mg/dL — ABNORMAL HIGH (ref 70–99)
Glucose-Capillary: 141 mg/dL — ABNORMAL HIGH (ref 70–99)
Glucose-Capillary: 209 mg/dL — ABNORMAL HIGH (ref 70–99)
Glucose-Capillary: 219 mg/dL — ABNORMAL HIGH (ref 70–99)
Glucose-Capillary: 204 mg/dL — ABNORMAL HIGH (ref 70–99)
Glucose-Capillary: 195 mg/dL — ABNORMAL HIGH (ref 70–99)

## 2019-01-15 MED ORDER — FREE WATER
250.0000 mL | Freq: Three times a day (TID) | Status: DC
  Administered 2019-01-15 – 2019-01-19 (×9): 250 mL

## 2019-01-15 MED ORDER — POTASSIUM CHLORIDE 10 MEQ/50ML IV SOLN
10.0000 meq | INTRAVENOUS | Status: AC
  Administered 2019-01-15 (×4): 10 meq via INTRAVENOUS
  Filled 2019-01-15 (×4): qty 50

## 2019-01-15 MED ORDER — POTASSIUM CHLORIDE 20 MEQ/15ML (10%) PO SOLN
40.0000 meq | Freq: Three times a day (TID) | ORAL | Status: AC
  Administered 2019-01-15 (×2): 40 meq
  Filled 2019-01-15 (×2): qty 30

## 2019-01-15 MED ORDER — POTASSIUM CHLORIDE 20 MEQ/15ML (10%) PO SOLN
40.0000 meq | Freq: Three times a day (TID) | ORAL | Status: DC
  Administered 2019-01-15: 40 meq via ORAL
  Filled 2019-01-15: qty 30

## 2019-01-15 MED ORDER — BIOTENE DRY MOUTH MT LIQD: 15.0000 mL | OROMUCOSAL | Status: DC | PRN

## 2019-01-15 MED ORDER — PIVOT 1.5 CAL PO LIQD
1000.0000 mL | ORAL | Status: DC
  Administered 2019-01-15: 1000 mL
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
## 2019-01-15 NOTE — Progress Notes (Signed)
Assisted tele visit to patient with daughter. ° °Latavion Halls P, RN  °
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
## 2019-01-15 NOTE — Progress Notes (Signed)
Assisted tele visit to patient with son.  Hillard Danker, RN l

## 2019-01-15 NOTE — Progress Notes (Signed)
Assisted tele visit to patient with daughter.  Deberah Adolf P, RN  
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
## 2019-01-15 NOTE — Progress Notes (Signed)
RT reported ABG results to RN  Ph  7.548 PCO2  46.1  PO2  139 HCO3  40.1 O2 Sat  99%

## 2019-01-15 NOTE — Progress Notes (Signed)
Update given to patients daughter via telephone. Daughter declined video chat at this time.

## 2019-01-16 ENCOUNTER — Inpatient Hospital Stay (HOSPITAL_COMMUNITY): Payer: Medicare Other

## 2019-01-16 LAB — BASIC METABOLIC PANEL
Anion gap: 8 (ref 5–15)
BUN: 32 mg/dL — ABNORMAL HIGH (ref 8–23)
CO2: 36 mmol/L — ABNORMAL HIGH (ref 22–32)
Calcium: 8.9 mg/dL (ref 8.9–10.3)
Chloride: 103 mmol/L (ref 98–111)
Creatinine, Ser: 0.66 mg/dL (ref 0.44–1.00)
GFR calc Af Amer: >60 mL/min (ref >60)
GFR calc non Af Amer: >60 mL/min (ref >60)
Glucose, Bld: 210 mg/dL — ABNORMAL HIGH (ref 70–99)
Potassium: 3.3 mmol/L — ABNORMAL LOW (ref 3.5–5.1)
Sodium: 147 mmol/L — ABNORMAL HIGH (ref 135–145)

## 2019-01-16 LAB — GLUCOSE, CAPILLARY
Glucose-Capillary: 197 mg/dL — ABNORMAL HIGH (ref 70–99)
Glucose-Capillary: 188 mg/dL — ABNORMAL HIGH (ref 70–99)
Glucose-Capillary: 220 mg/dL — ABNORMAL HIGH (ref 70–99)
Glucose-Capillary: 175 mg/dL — ABNORMAL HIGH (ref 70–99)
Glucose-Capillary: 177 mg/dL — ABNORMAL HIGH (ref 70–99)
Glucose-Capillary: 180 mg/dL — ABNORMAL HIGH (ref 70–99)

## 2019-01-16 LAB — CBC
HCT: 31.3 % — ABNORMAL LOW (ref 36.0–46.0)
Hemoglobin: 9.3 g/dL — ABNORMAL LOW (ref 12.0–15.0)
MCH: 29.3 pg (ref 26.0–34.0)
MCHC: 29.7 g/dL — ABNORMAL LOW (ref 30.0–36.0)
MCV: 98.7 fL (ref 80.0–100.0)
Platelets: 352 10*3/uL (ref 150–400)
RBC: 3.17 MIL/uL — ABNORMAL LOW (ref 3.87–5.11)
RDW: 16.1 % — ABNORMAL HIGH (ref 11.5–15.5)
WBC: 7.4 10*3/uL (ref 4.0–10.5)
nRBC: 0 % (ref 0.0–0.2)

## 2019-01-16 MED ORDER — LOPERAMIDE HCL 1 MG/7.5ML PO SUSP
1.0000 mg | Freq: Four times a day (QID) | ORAL | Status: DC
  Administered 2019-01-16 – 2019-01-18 (×6): 1 mg
  Filled 2019-01-16 (×10): qty 15

## 2019-01-16 MED ORDER — POTASSIUM CHLORIDE 20 MEQ/15ML (10%) PO SOLN
40.0000 meq | Freq: Two times a day (BID) | ORAL | Status: AC
  Administered 2019-01-16 (×2): 40 meq
  Filled 2019-01-16 (×2): qty 30

## 2019-01-16 MED ORDER — BETHANECHOL CHLORIDE 10 MG PO TABS
25.0000 mg | ORAL_TABLET | Freq: Three times a day (TID) | ORAL | Status: DC
  Administered 2019-01-16 – 2019-01-18 (×9): 25 mg via ORAL
  Filled 2019-01-16 (×9): qty 3

## 2019-01-16 MED ORDER — PRO-STAT SUGAR FREE PO LIQD
30.0000 mL | Freq: Three times a day (TID) | ORAL | Status: DC
  Administered 2019-01-16 – 2019-01-19 (×7): 30 mL
  Filled 2019-01-16 (×8): qty 30
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
## 2019-01-16 NOTE — Progress Notes (Signed)
Patient vomited around 1630.   Tube feeds held.    Patient desat around 1700. Rt notified and bump oxygen to 80%.  Patient oxygen saturation in low 90s.  Patient also started burping and has feeling that needs to vomit.  On call trauma MD notified. Order to continue to hold tube feeds, and put NG tube to low intermittent suction.    NG output 600 ml immediately.  RN to continue to monitor.

## 2019-01-17 ENCOUNTER — Inpatient Hospital Stay (HOSPITAL_COMMUNITY): Payer: Medicare Other

## 2019-01-17 LAB — GLUCOSE, CAPILLARY
Glucose-Capillary: 130 mg/dL — ABNORMAL HIGH (ref 70–99)
Glucose-Capillary: 163 mg/dL — ABNORMAL HIGH (ref 70–99)
Glucose-Capillary: 162 mg/dL — ABNORMAL HIGH (ref 70–99)
Glucose-Capillary: 129 mg/dL — ABNORMAL HIGH (ref 70–99)
Glucose-Capillary: 170 mg/dL — ABNORMAL HIGH (ref 70–99)
Glucose-Capillary: 191 mg/dL — ABNORMAL HIGH (ref 70–99)

## 2019-01-17 LAB — CBC
HCT: 34.2 % — ABNORMAL LOW (ref 36.0–46.0)
Hemoglobin: 10 g/dL — ABNORMAL LOW (ref 12.0–15.0)
MCH: 29.1 pg (ref 26.0–34.0)
MCHC: 29.2 g/dL — ABNORMAL LOW (ref 30.0–36.0)
MCV: 99.4 fL (ref 80.0–100.0)
Platelets: 397 10*3/uL (ref 150–400)
RBC: 3.44 MIL/uL — ABNORMAL LOW (ref 3.87–5.11)
RDW: 15.9 % — ABNORMAL HIGH (ref 11.5–15.5)
WBC: 7.5 10*3/uL (ref 4.0–10.5)
nRBC: 0 % (ref 0.0–0.2)

## 2019-01-17 LAB — BASIC METABOLIC PANEL
Anion gap: 10 (ref 5–15)
BUN: 30 mg/dL — ABNORMAL HIGH (ref 8–23)
CO2: 35 mmol/L — ABNORMAL HIGH (ref 22–32)
Calcium: 8.9 mg/dL (ref 8.9–10.3)
Chloride: 103 mmol/L (ref 98–111)
Creatinine, Ser: 0.7 mg/dL (ref 0.44–1.00)
GFR calc Af Amer: >60 mL/min (ref >60)
GFR calc non Af Amer: >60 mL/min (ref >60)
Glucose, Bld: 185 mg/dL — ABNORMAL HIGH (ref 70–99)
Potassium: 3.4 mmol/L — ABNORMAL LOW (ref 3.5–5.1)
Sodium: 148 mmol/L — ABNORMAL HIGH (ref 135–145)

## 2019-01-17 MED ORDER — VECURONIUM BROMIDE 10 MG IV SOLR
10.0000 mg | Freq: Once | INTRAVENOUS | Status: AC
  Administered 2019-01-17: 10 mg via INTRAVENOUS
  Filled 2019-01-17: qty 10

## 2019-01-17 MED ORDER — INSULIN DETEMIR 100 UNIT/ML ~~LOC~~ SOLN
20.0000 [IU] | Freq: Every day | SUBCUTANEOUS | Status: DC
  Administered 2019-01-17 – 2019-01-18 (×2): 20 [IU] via SUBCUTANEOUS
  Filled 2019-01-17 (×3): qty 0.2

## 2019-01-17 MED ORDER — POTASSIUM CHLORIDE 20 MEQ/15ML (10%) PO SOLN
40.0000 meq | Freq: Two times a day (BID) | ORAL | Status: AC
  Administered 2019-01-17 (×2): 40 meq
  Filled 2019-01-17 (×2): qty 30

## 2019-01-17 MED ORDER — MIDAZOLAM HCL 2 MG/2ML IJ SOLN
2.0000 mg | Freq: Once | INTRAMUSCULAR | Status: AC
  Administered 2019-01-17: 09:00:00 2 mg via INTRAVENOUS
  Filled 2019-01-17: qty 2

## 2019-01-17 MED ORDER — FENTANYL CITRATE (PF) 100 MCG/2ML IJ SOLN
INTRAMUSCULAR | Status: AC
  Administered 2019-01-17: 10:00:00 100 ug
  Filled 2019-01-17: qty 2

## 2019-01-17 NOTE — Progress Notes (Signed)
PT Cancellation Note  Patient Details Name: Heather Lyons MRN: [id number] DOB: [birth year]   Cancelled Treatment:    Reason Eval/Treat Not Completed: Other (comment) RN requesting hold; recent bronchoscopy procedure with paralytics given.  Heather Lyons, PT, DPT Acute Rehabilitation Services Pager [phone] Office [phone]    Heather Lyons 01/17/2019, 10:41 AM

## 2019-01-17 NOTE — Progress Notes (Signed)
OT Cancellation Note  Patient Details Name: Heather Lyons MRN: [id number] DOB: [birth year]   Cancelled Treatment:    Reason Eval/Treat Not Completed: Other (comment); RN requesting hold; recent bronchoscopy procedure with paralytics given. Will follow up for OT treatment as able.  Lou Cal, OT Supplemental Rehabilitation Services Pager [phone] Office [phone]   Raymondo Band 01/17/2019, 11:05 AM

## 2019-01-17 NOTE — Procedures (Signed)
Bedside Bronchoscopy Procedure Note Heather Lyons [id number] [birth year]  Procedure: Bronchoscopy Indications: Diagnostic evaluation of the airways, Obtain specimens for culture and/or other diagnostic studies and Remove secretions  Procedure Details: ET Tube Size: ET Tube secured at lip (cm): Bite block in place: Yes In preparation for procedure, Patient hyper-oxygenated with 100 % FiO2 Airway entered and the following bronchi were examined: RUL, RML, RLL, LUL, LLL and Bronchi.   Bronchoscope removed.    Evaluation BP (!) 110/53   Pulse 89   Temp 98.6 F (37 C) (Axillary)   Resp (!) 21   Ht 5\' 6"  (1.676 m)   Wt 106.1 kg   SpO2 94%   BMI 37.75 kg/m  Breath Sounds:Rhonch O2 sats: stable throughout Patient's Current Condition: stable Specimens:  Sent thick tan secretions.  Complications: No apparent complications Patient did tolerate procedure well.   Claretta Fraise 01/17/2019, 9:47 AM

## 2019-01-17 NOTE — Consult Note (Addendum)
[place] Nurse ostomy follow up Surgical team following for assessment and plan of care for abd wound. Pt remains critically ill on the ventilator and is not able to watch the pouch change or participate in a teaching session.  Stoma type/location: RLQ, ileostomy, dark pink, moist,  slightly above skin level Stomal assessment/size: 1 1/4 inches  Peristomal assessment: intact.  Output:  Mod amt brown thick liquid stool.  Previously was using a high output pouch, but stool is currently too thick to drain out of the spout.  Ostomy pouching: Applied barrier ring to attempt to maintain a seal, then 2 piece pouching system.  Ordered extra supplies to the bedside for velcro emptying and also high output pouches if stool becomes watery again. Enrolled patient in [facility] Discharge program: No WOC team will continue to assess weekly and will begin teaching sessions when patient is stable and out of ICU. Julien Girt MSN, RN, [place], [place], [place]
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
## 2019-01-17 NOTE — Procedures (Signed)
Bronchoscopy Procedure Note Heather Lyons [id number] [birth year]  Procedure: Bronchoscopy Indications: Obtain specimens for culture and/or other diagnostic studies and Remove secretions  Complete ATX L lung  Procedure Details Consent: Risks of procedure as well as the alternatives and risks of each were explained to the (patient/caregiver).  Consent for procedure obtained. Time Out: Verified patient identification, verified procedure, site/side was marked, verified correct patient position, special equipment/implants available, medications/allergies/relevent history reviewed, required imaging and test results available.  Performed  In preparation for procedure, patient was given 100% FiO2. Sedation: Benzodiazepines, Muscle relaxants and fentanyl  Airway entered and the following bronchi were examined: RUL, RML, RLL, LUL, LLL and Bronchi.   Procedures performed: Brushings performed Bronchoscope removed.    Evaluation Hemodynamic Status: BP stable throughout; O2 sats: stable throughout Patient's Current Condition: stable Specimens:  Sent purulent fluid Complications: No apparent complications Patient did tolerate procedure well.  CXR post-procedure shows significant improvement Zenovia Jarred 01/17/2019
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
## 2019-01-18 ENCOUNTER — Encounter (HOSPITAL_COMMUNITY): Payer: Self-pay | Admitting: Anesthesiology

## 2019-01-18 ENCOUNTER — Inpatient Hospital Stay (HOSPITAL_COMMUNITY): Payer: Medicare Other

## 2019-01-18 ENCOUNTER — Encounter (HOSPITAL_COMMUNITY): Admission: EM | Disposition: A | Discharge status: Skilled Nursing Facility | Payer: Self-pay | Source: Home / Self Care

## 2019-01-18 HISTORY — PX: PERCUTANEOUS TRACHEOSTOMY: SHX5288

## 2019-01-18 LAB — GLUCOSE, CAPILLARY
Glucose-Capillary: 166 mg/dL — ABNORMAL HIGH (ref 70–99)
Glucose-Capillary: 99 mg/dL (ref 70–99)
Glucose-Capillary: 168 mg/dL — ABNORMAL HIGH (ref 70–99)
Glucose-Capillary: 153 mg/dL — ABNORMAL HIGH (ref 70–99)
Glucose-Capillary: 162 mg/dL — ABNORMAL HIGH (ref 70–99)
Glucose-Capillary: 210 mg/dL — ABNORMAL HIGH (ref 70–99)

## 2019-01-18 SURGERY — CREATION, TRACHEOSTOMY, PERCUTANEOUS
Anesthesia: General

## 2019-01-18 MED ORDER — SODIUM CHLORIDE 0.9% FLUSH
INTRAVENOUS | Status: DC | PRN
  Administered 2019-01-18 (×2): 10 mL

## 2019-01-18 MED ORDER — LIDOCAINE-EPINEPHRINE (PF) 1.5 %-1:200000 IJ SOLN
INTRAMUSCULAR | Status: DC | PRN
  Administered 2019-01-18: 4 mL

## 2019-01-18 MED ORDER — IPRATROPIUM-ALBUTEROL 0.5-2.5 (3) MG/3ML IN SOLN
3.0000 mL | Freq: Four times a day (QID) | RESPIRATORY_TRACT | Status: DC
  Administered 2019-01-18 (×2): 3 mL via RESPIRATORY_TRACT
  Filled 2019-01-18 (×2): qty 3

## 2019-01-18 MED ORDER — PROPOFOL 10 MG/ML IV BOLUS: INTRAVENOUS | Status: DC | PRN

## 2019-01-18 MED ORDER — VECURONIUM BROMIDE 10 MG IV SOLR
10.0000 mg | Freq: Once | INTRAVENOUS | Status: AC
  Administered 2019-01-18: 10 mg via INTRAVENOUS
  Filled 2019-01-18: qty 10

## 2019-01-18 MED ORDER — PIVOT 1.5 CAL PO LIQD
1000.0000 mL | ORAL | Status: DC
  Administered 2019-01-18 – 2019-01-19 (×2): 1000 mL

## 2019-01-18 MED ORDER — GUAIFENESIN 100 MG/5ML PO SOLN
15.0000 mL | Freq: Four times a day (QID) | ORAL | Status: DC
  Administered 2019-01-18 – 2019-01-19 (×5): 300 mg
  Filled 2019-01-18 (×4): qty 15

## 2019-01-18 MED ORDER — SUCCINYLCHOLINE CHLORIDE 20 MG/ML IJ SOLN
100.0000 mg | Freq: Once | INTRAMUSCULAR | Status: AC
  Administered 2019-01-18: 02:00:00 100 mg via INTRAVENOUS
  Filled 2019-01-18: qty 5

## 2019-01-18 MED ORDER — BETHANECHOL CHLORIDE 10 MG PO TABS
25.0000 mg | ORAL_TABLET | Freq: Three times a day (TID) | ORAL | Status: DC
  Administered 2019-01-19: 25 mg
  Filled 2019-01-18: qty 3

## 2019-01-18 MED ORDER — IPRATROPIUM-ALBUTEROL 0.5-2.5 (3) MG/3ML IN SOLN: 3.0000 mL | Freq: Four times a day (QID) | RESPIRATORY_TRACT | Status: DC | PRN

## 2019-01-18 MED ORDER — FENTANYL CITRATE (PF) 100 MCG/2ML IJ SOLN
100.0000 ug | Freq: Once | INTRAMUSCULAR | Status: AC
  Administered 2019-01-18: 100 ug via INTRAVENOUS
  Filled 2019-01-18: qty 2

## 2019-01-18 MED ORDER — MIDAZOLAM HCL 2 MG/2ML IJ SOLN
2.0000 mg | Freq: Once | INTRAMUSCULAR | Status: AC
  Administered 2019-01-18: 2 mg via INTRAVENOUS
  Filled 2019-01-18: qty 2

## 2019-01-18 MED ORDER — PROPOFOL 10 MG/ML IV BOLUS
INTRAVENOUS | Status: DC | PRN
  Administered 2019-01-18: 50 mg via INTRAVENOUS

## 2019-01-18 MED ORDER — PROPOFOL 10 MG/ML IV BOLUS
50.0000 mg | Freq: Once | INTRAVENOUS | Status: AC
  Administered 2019-01-18: 50 mg via INTRAVENOUS

## 2019-01-18 MED ORDER — SUCCINYLCHOLINE CHLORIDE 20 MG/ML IJ SOLN
INTRAMUSCULAR | Status: DC | PRN
  Administered 2019-01-18: 100 mg via INTRAVENOUS

## 2019-01-18 MED ORDER — POTASSIUM CHLORIDE 20 MEQ/15ML (10%) PO SOLN
40.0000 meq | Freq: Once | ORAL | Status: AC
  Administered 2019-01-18: 40 meq
  Filled 2019-01-18: qty 30

## 2019-01-18 SURGICAL SUPPLY — 34 items
DRAPE HALF SHEET 40X57 (DRAPES) ×12 IMPLANT
DRAPE UTILITY XL STRL (DRAPES) ×3 IMPLANT
DRESSING ALLEVYN 2X4 GNTL LT (GAUZE/BANDAGES/DRESSINGS) ×3 IMPLANT
ELECT CAUTERY BLADE 6.4 (BLADE) ×3 IMPLANT
ELECT REM PT RETURN 9FT ADLT (ELECTROSURGICAL) ×3
ELECTRODE REM PT RTRN 9FT ADLT (ELECTROSURGICAL) ×1 IMPLANT
GAUZE 4X4 16PLY RFD (DISPOSABLE) ×3 IMPLANT
GLOVE BIO SURGEON STRL SZ 6 (GLOVE) IMPLANT
GLOVE BIO SURGEON STRL SZ7 (GLOVE) ×3 IMPLANT
GLOVE BIO SURGEON STRL SZ7.5 (GLOVE) ×3 IMPLANT
GLOVE BIO SURGEON STRL SZ8 (GLOVE) ×3 IMPLANT
GLOVE BIOGEL PI IND STRL 6.5 (GLOVE) IMPLANT
GLOVE BIOGEL PI IND STRL 7.0 (GLOVE) ×1 IMPLANT
GLOVE BIOGEL PI IND STRL 7.5 (GLOVE) ×1 IMPLANT
GLOVE BIOGEL PI IND STRL 8 (GLOVE) ×1 IMPLANT
GLOVE BIOGEL PI INDICATOR 6.5 (GLOVE)
GLOVE BIOGEL PI INDICATOR 7.0 (GLOVE) ×2
GLOVE BIOGEL PI INDICATOR 7.5 (GLOVE) ×2
GLOVE BIOGEL PI INDICATOR 8 (GLOVE) ×2
GOWN STRL REUS W/ TWL LRG LVL3 (GOWN DISPOSABLE) ×2 IMPLANT
GOWN STRL REUS W/ TWL XL LVL3 (GOWN DISPOSABLE) ×2 IMPLANT
GOWN STRL REUS W/TWL LRG LVL3 (GOWN DISPOSABLE) ×4
GOWN STRL REUS W/TWL XL LVL3 (GOWN DISPOSABLE) ×4
INTRODUCER TRACH BLUE RHINO 6F (TUBING) ×3 IMPLANT
INTRODUCER TRACH BLUE RHINO 8F (TUBING) IMPLANT
PENCIL BUTTON HOLSTER BLD 10FT (ELECTRODE) ×3 IMPLANT
SPONGE DRAIN TRACH 4X4 STRL 2S (GAUZE/BANDAGES/DRESSINGS) ×3 IMPLANT
SPONGE INTESTINAL PEANUT (DISPOSABLE) ×3 IMPLANT
SUT SILK 3 0 SH CR/8 (SUTURE) ×3 IMPLANT
SUT VICRYL AB 3 0 TIES (SUTURE) ×3 IMPLANT
TOWEL GREEN STERILE FF (TOWEL DISPOSABLE) ×3 IMPLANT
TUBE CONNECTING 12'X1/4 (SUCTIONS) ×1
TUBE CONNECTING 12X1/4 (SUCTIONS) ×2 IMPLANT
YANKAUER SUCT BULB TIP NO VENT (SUCTIONS) ×3 IMPLANT

## 2019-01-18 NOTE — Progress Notes (Signed)
Patient alert and able to communicate needs, patient education provided at start of shift and verbal contract for safety was obtained. Patient self extubated at roughly 0125, Trauma MD notified and to bedside. Anesthesia paged to re-intubate. Patient stable throughout, placement confirmed with CXR. Restraints not to be removed for any reason per MD. Patient restraints verified secure BUE.  Candy Sledge, RN

## 2019-01-18 NOTE — Op Note (Signed)
01/05/2019  9:11 AM  PATIENT:  Heather Lyons  84 y.o. female  PRE-OPERATIVE DIAGNOSIS:  Respiratory failure  POST-OPERATIVE DIAGNOSIS:  Respiratory failure  PROCEDURE:  Procedure(s): Bedside tracheostomy #6 Shiley  SURGEON:  Surgeon(s): Georganna Skeans, MD  ASSISTANTS: Alferd Apa, PA-C   ANESTHESIA:   local and IV sedation  EBL:  Total I/O In: 19.9 [I.V.:19.9] Out: 125 [Stool:125]  BLOOD ADMINISTERED:none  DRAINS: none   SPECIMEN:  No Specimen  DISPOSITION OF SPECIMEN:  N/A  COUNTS:  YES  DICTATION: .Dragon Dictation Procedure in detail: Informed consent was obtained.  The patient remained monitored in the trauma intensive care unit.  She was positioned and her anterior neck was prepped and draped in a sterile fashion.  We did a timeout procedure.  Local anesthetic was injected.  A vertical incision was made approximately 2 cm cephalad to the sternal notch.  Subcutaneous tissues were dissected down using cautery through the platysma identifying the strap muscles.  These were split along the midline and the anterior surface of the trachea was identified.  The endotracheal tube was pulled back under direct palpation to an appropriate position.  I placed the Angiocath between the second and third tracheal ring followed by the guidewire.  The small Blue Rhino dilator was then placed.  Next, we placed the large blue rhino dilator over the wire.  Next, we placed a #6 Shiley tracheostomy tube over a 24 [place] dilator.  It passed easily.  It was hooked up to the ventilator circuit and good volume returns were obtained.  We placed a skin protective dressing underneath the tracheostomy and then secured it with Prolene sutures.  A Velcro trach tie was applied.  She tolerated the procedure well without apparent complication.  We will check a stat portable chest x-ray.  All counts were correct. PATIENT DISPOSITION:  ICU - intubated and critically ill.   Delay start of Pharmacological VTE  agent (>24hrs) due to surgical blood loss or risk of bleeding:  no  Georganna Skeans, MD, MPH, FACS Pager: [phone]  7/23/20209:11 AM

## 2019-01-18 NOTE — Progress Notes (Signed)
OT Cancellation Note  Patient Details Name: Heather Lyons MRN: [id number] DOB: [birth year]   Cancelled Treatment:    Reason Eval/Treat Not Completed: Medical issues which prohibited therapy.  Pt had tracheostomy this am.  Will reattempt.  Lucille Passy, OTR/L [place] Pager [phone] Office [phone]   Lucille Passy M 01/18/2019, 11:03 AM

## 2019-01-18 NOTE — Progress Notes (Signed)
PT Cancellation Note  Patient Details Name: Myles Mallicoat MRN: [id number] DOB: 1935-03-03   Cancelled Treatment:    Reason Eval/Treat Not Completed: Patient not medically ready.  Newly trached.  Will see as able 7/24. 01/18/2019  Donnella Sham, PT Acute Rehabilitation Services [phone]  (pager) [phone]  (office)   Tessie Fass Marquie Aderhold 01/18/2019, 10:41 AM

## 2019-01-18 NOTE — Progress Notes (Signed)
Patient ID: Heather Lyons, female   DOB: 16-Feb-1935, 84 y.o.   MRN: [id number] Post trach CXR shows total ATX L side. I suctioned a large amount of secretions and sats improved. Seemed to get it.  Will follow closely.  Georganna Skeans, MD, MPH, FACS Trauma & General Surgery: [phone]

## 2019-01-18 NOTE — Anesthesia Procedure Notes (Signed)
Procedure Name: Intubation Date/Time: 01/18/2019 1:42 AM Performed by: Valetta Fuller, CRNA Pre-anesthesia Checklist: Patient identified, Emergency Drugs available, Suction available and Patient being monitored Patient Re-evaluated:Patient Re-evaluated prior to induction Oxygen Delivery Method: Circle system utilized Preoxygenation: Pre-oxygenation with 100% oxygen Induction Type: IV induction, Rapid sequence and Cricoid Pressure applied Laryngoscope Size: Miller and 2 Grade View: Grade I Tube size: 7.5 mm Number of attempts: 1 Airway Equipment and Method: Stylet Placement Confirmation: ETT inserted through vocal cords under direct vision,  positive ETCO2 and breath sounds checked- equal and bilateral Secured at: 22 cm Tube secured with: Tape Dental Injury: Teeth and Oropharynx as per pre-operative assessment

## 2019-01-18 NOTE — Progress Notes (Signed)
Patient ID: Heather Lyons, female   DOB: [birth year], 84 y.o.   MRN: [id number] I called her daughter and updated her.  Georganna Skeans, MD, MPH, FACS Trauma & General Surgery: [phone]

## 2019-01-18 NOTE — Progress Notes (Signed)
SLP Cancellation Note  Patient Details Name: Heather Lyons MRN: [id number] DOB: 1935/02/17   Cancelled treatment:       Reason Eval/Treat Not Completed: Patient not medically ready Patient with new tracheostomy. Orders for SLP eval and treat for PMSV and swallowing received. Will follow pt closely for readiness for SLP interventions as appropriate.     Fadumo Heng, Katherene Ponto 01/18/2019, 10:46 AM
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
## 2019-01-18 NOTE — Progress Notes (Signed)
Self extubated again tonight. She quickly becomes apneic and desaturates. Currently sats 100% on NRB but tachypneic with RR in 30s. Do not think she will fly even with bipap. Anesthesia called and she was reintubated uneventfully. cxr pending. Nurse advised to maintain restraints.  Sharon Mt. Dema Severin, M.D. [facility]

## 2019-01-18 NOTE — Progress Notes (Addendum)
RT called to pt room for self extubation. Pt being bagged by RN 100% BVM. RT placed pt on NRB, pt spontaneously breathing and awake, Pt goes apneic when dozing off to sleep. CRNA called to reintubate pt. Pt was preoxygenated and suctioned prior to be intubated, 7.5 tube placed 22 @ lip, good bilat breath sounds, ETCO2 color change good, chest rise bilat. CXR done. RT will continue to monitor.
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
## 2019-01-18 NOTE — Progress Notes (Signed)
Ostomy output minimal since tube feeds restarted.  Trauma MD aware.

## 2019-01-19 ENCOUNTER — Inpatient Hospital Stay (HOSPITAL_COMMUNITY): Payer: Medicare Other

## 2019-01-19 ENCOUNTER — Inpatient Hospital Stay: Admission: RE | Admit: 2019-01-19 | Payer: Medicare Other | Source: Ambulatory Visit | Admitting: Internal Medicine

## 2019-01-19 ENCOUNTER — Other Ambulatory Visit: Payer: Self-pay

## 2019-01-19 ENCOUNTER — Other Ambulatory Visit (HOSPITAL_COMMUNITY): Payer: Self-pay

## 2019-01-19 ENCOUNTER — Encounter (HOSPITAL_COMMUNITY): Payer: Self-pay | Admitting: General Surgery

## 2019-01-19 ENCOUNTER — Inpatient Hospital Stay
Admission: RE | Admit: 2019-01-19 | Discharge: 2019-01-20 | Disposition: A | Discharge status: Rehabilitation Facility | Payer: Medicare Other | Source: Ambulatory Visit | Attending: Internal Medicine | Admitting: Internal Medicine

## 2019-01-19 DIAGNOSIS — S271XXD Traumatic hemothorax, subsequent encounter: Secondary | ICD-10-CM

## 2019-01-19 DIAGNOSIS — Z0189 Encounter for other specified special examinations: Secondary | ICD-10-CM

## 2019-01-19 DIAGNOSIS — N179 Acute kidney failure, unspecified: Secondary | ICD-10-CM

## 2019-01-19 DIAGNOSIS — S27322A Contusion of lung, bilateral, initial encounter: Secondary | ICD-10-CM | POA: Diagnosis present

## 2019-01-19 DIAGNOSIS — J9621 Acute and chronic respiratory failure with hypoxia: Secondary | ICD-10-CM | POA: Diagnosis present

## 2019-01-19 DIAGNOSIS — Z4659 Encounter for fitting and adjustment of other gastrointestinal appliance and device: Secondary | ICD-10-CM

## 2019-01-19 DIAGNOSIS — N170 Acute kidney failure with tubular necrosis: Secondary | ICD-10-CM | POA: Diagnosis present

## 2019-01-19 DIAGNOSIS — R52 Pain, unspecified: Secondary | ICD-10-CM

## 2019-01-19 DIAGNOSIS — Z930 Tracheostomy status: Secondary | ICD-10-CM

## 2019-01-19 DIAGNOSIS — Q6689 Other  specified congenital deformities of feet: Secondary | ICD-10-CM

## 2019-01-19 DIAGNOSIS — Z9911 Dependence on respirator [ventilator] status: Secondary | ICD-10-CM

## 2019-01-19 DIAGNOSIS — T148XXA Other injury of unspecified body region, initial encounter: Secondary | ICD-10-CM

## 2019-01-19 HISTORY — DX: Contusion of lung, bilateral, initial encounter: S27.322A

## 2019-01-19 HISTORY — DX: Acute kidney failure with tubular necrosis: N17.0

## 2019-01-19 HISTORY — DX: Acute and chronic respiratory failure with hypoxia: J96.21

## 2019-01-19 HISTORY — DX: Traumatic hemothorax, subsequent encounter: S27.1XXD

## 2019-01-19 LAB — CBC
HCT: 34.8 % — ABNORMAL LOW (ref 36.0–46.0)
Hemoglobin: 10 g/dL — ABNORMAL LOW (ref 12.0–15.0)
MCH: 28.4 pg (ref 26.0–34.0)
MCHC: 28.7 g/dL — ABNORMAL LOW (ref 30.0–36.0)
MCV: 98.9 fL (ref 80.0–100.0)
Platelets: 417 10*3/uL — ABNORMAL HIGH (ref 150–400)
RBC: 3.52 MIL/uL — ABNORMAL LOW (ref 3.87–5.11)
RDW: 15.9 % — ABNORMAL HIGH (ref 11.5–15.5)
WBC: 7.4 10*3/uL (ref 4.0–10.5)
nRBC: 0 % (ref 0.0–0.2)

## 2019-01-19 LAB — POCT I-STAT 7, (LYTES, BLD GAS, ICA,H+H)
Acid-Base Excess: 12 mmol/L — ABNORMAL HIGH (ref 0.0–2.0)
Bicarbonate: 36.5 mmol/L — ABNORMAL HIGH (ref 20.0–28.0)
Calcium, Ion: 1.17 mmol/L (ref 1.15–1.40)
HCT: 31 % — ABNORMAL LOW (ref 36.0–46.0)
Hemoglobin: 10.5 g/dL — ABNORMAL LOW (ref 12.0–15.0)
O2 Saturation: 97 %
Patient temperature: 100.6
Potassium: 3.3 mmol/L — ABNORMAL LOW (ref 3.5–5.1)
Sodium: 149 mmol/L — ABNORMAL HIGH (ref 135–145)
TCO2: 38 mmol/L — ABNORMAL HIGH (ref 22–32)
pCO2 arterial: 45.8 mmHg (ref 32.0–48.0)
pH, Arterial: 7.513 — ABNORMAL HIGH (ref 7.350–7.450)
pO2, Arterial: 87 mmHg (ref 83.0–108.0)

## 2019-01-19 LAB — GLUCOSE, CAPILLARY
Glucose-Capillary: 210 mg/dL — ABNORMAL HIGH (ref 70–99)
Glucose-Capillary: 124 mg/dL — ABNORMAL HIGH (ref 70–99)
Glucose-Capillary: 130 mg/dL — ABNORMAL HIGH (ref 70–99)

## 2019-01-19 LAB — BASIC METABOLIC PANEL
Anion gap: 12 (ref 5–15)
BUN: 33 mg/dL — ABNORMAL HIGH (ref 8–23)
CO2: 32 mmol/L (ref 22–32)
Calcium: 8.5 mg/dL — ABNORMAL LOW (ref 8.9–10.3)
Chloride: 104 mmol/L (ref 98–111)
Creatinine, Ser: 0.79 mg/dL (ref 0.44–1.00)
GFR calc Af Amer: >60 mL/min (ref >60)
GFR calc non Af Amer: >60 mL/min (ref >60)
Glucose, Bld: 172 mg/dL — ABNORMAL HIGH (ref 70–99)
Potassium: 3 mmol/L — ABNORMAL LOW (ref 3.5–5.1)
Sodium: 148 mmol/L — ABNORMAL HIGH (ref 135–145)

## 2019-01-19 LAB — URINALYSIS, ROUTINE W REFLEX MICROSCOPIC
Bacteria, UA: NONE SEEN
Bilirubin Urine: NEGATIVE
Glucose, UA: NEGATIVE mg/dL
Hgb urine dipstick: NEGATIVE
Ketones, ur: NEGATIVE mg/dL
Leukocytes,Ua: NEGATIVE
Nitrite: NEGATIVE
Protein, ur: 30 mg/dL — AB
Specific Gravity, Urine: 1.014 (ref 1.005–1.030)
pH: 8 (ref 5.0–8.0)

## 2019-01-19 MED ORDER — INSULIN ASPART 100 UNIT/ML ~~LOC~~ SOLN: 0.0000 [IU] | SUBCUTANEOUS | 11 refills | Status: AC

## 2019-01-19 MED ORDER — BETHANECHOL CHLORIDE 25 MG PO TABS: 25.0000 mg | ORAL_TABLET | Freq: Three times a day (TID) | ORAL | Status: AC

## 2019-01-19 MED ORDER — GUAIFENESIN 100 MG/5ML PO SOLN: 15.0000 mL | Freq: Four times a day (QID) | ORAL | 0 refills | Status: AC

## 2019-01-19 MED ORDER — VANCOMYCIN HCL 10 G IV SOLR: 1750.0000 mg | INTRAVENOUS | Status: DC

## 2019-01-19 MED ORDER — IPRATROPIUM-ALBUTEROL 0.5-2.5 (3) MG/3ML IN SOLN: 3.0000 mL | Freq: Four times a day (QID) | RESPIRATORY_TRACT | Status: AC | PRN

## 2019-01-19 MED ORDER — METHOCARBAMOL 1000 MG/10ML IJ SOLN: 1000.0000 mg | Freq: Three times a day (TID) | INTRAVENOUS | Status: AC | PRN

## 2019-01-19 MED ORDER — PIVOT 1.5 CAL PO LIQD: 1000.0000 mL | ORAL | 0 refills | Status: AC

## 2019-01-19 MED ORDER — VANCOMYCIN HCL 10 G IV SOLR
2000.0000 mg | Freq: Once | INTRAVENOUS | Status: AC
  Administered 2019-01-19: 13:00:00 2000 mg via INTRAVENOUS
  Filled 2019-01-19: qty 2000

## 2019-01-19 MED ORDER — PSYLLIUM 95 % PO PACK: 1.0000 | PACK | Freq: Two times a day (BID) | ORAL | Status: AC

## 2019-01-19 MED ORDER — ACETAMINOPHEN 160 MG/5ML PO SOLN: 650.0000 mg | Freq: Four times a day (QID) | ORAL | 0 refills | Status: AC | PRN

## 2019-01-19 MED ORDER — POTASSIUM CHLORIDE 10 MEQ/100ML IV SOLN
10.0000 meq | INTRAVENOUS | Status: AC
  Administered 2019-01-19 (×6): 10 meq via INTRAVENOUS
  Filled 2019-01-19: qty 100

## 2019-01-19 MED ORDER — QUETIAPINE FUMARATE 25 MG PO TABS: 25.0000 mg | ORAL_TABLET | Freq: Two times a day (BID) | ORAL | Status: AC

## 2019-01-19 MED ORDER — BIOTENE DRY MOUTH MT LIQD: 15.0000 mL | OROMUCOSAL | Status: AC | PRN

## 2019-01-19 MED ORDER — MORPHINE SULFATE (PF) 4 MG/ML IV SOLN: 4.0000 mg | INTRAVENOUS | 0 refills | Status: AC | PRN

## 2019-01-19 MED ORDER — PRO-STAT SUGAR FREE PO LIQD: 30.0000 mL | Freq: Three times a day (TID) | ORAL | 0 refills | Status: AC

## 2019-01-19 MED ORDER — ONDANSETRON HCL 4 MG/2ML IJ SOLN: 4.0000 mg | Freq: Four times a day (QID) | INTRAMUSCULAR | 0 refills | Status: AC | PRN

## 2019-01-19 MED ORDER — ORAL CARE MOUTH RINSE: 15.0000 mL | Freq: Two times a day (BID) | OROMUCOSAL | 0 refills | Status: AC

## 2019-01-19 MED ORDER — CHLORHEXIDINE GLUCONATE CLOTH 2 % EX PADS: 6.0000 | MEDICATED_PAD | Freq: Every day | CUTANEOUS | Status: AC

## 2019-01-19 MED ORDER — SODIUM CHLORIDE 0.9% FLUSH: 10.0000 mL | INTRAVENOUS | Status: AC | PRN

## 2019-01-19 MED ORDER — SODIUM CHLORIDE 0.9% FLUSH: 10.0000 mL | Freq: Two times a day (BID) | INTRAVENOUS | Status: AC

## 2019-01-19 MED ORDER — FREE WATER: 250.0000 mL | Freq: Three times a day (TID) | Status: AC

## 2019-01-19 MED ORDER — POTASSIUM CHLORIDE 10 MEQ/100ML IV SOLN: 10.0000 meq | INTRAVENOUS | Status: AC

## 2019-01-19 MED ORDER — LORAZEPAM 2 MG/ML IJ SOLN: 1.0000 mg | INTRAMUSCULAR | 0 refills | Status: AC | PRN

## 2019-01-19 MED ORDER — HYDRALAZINE HCL 20 MG/ML IJ SOLN: 10.0000 mg | INTRAMUSCULAR | Status: AC | PRN

## 2019-01-19 MED ORDER — CHLORHEXIDINE GLUCONATE 0.12% ORAL RINSE (MEDLINE KIT): 15.0000 mL | Freq: Two times a day (BID) | OROMUCOSAL | 0 refills | Status: AC

## 2019-01-19 MED ORDER — SODIUM CHLORIDE 0.9 % IV SOLN: 10.0000 mL | INTRAVENOUS | 0 refills | Status: AC | PRN

## 2019-01-19 MED ORDER — INSULIN DETEMIR 100 UNIT/ML ~~LOC~~ SOLN: 20.0000 [IU] | Freq: Every day | SUBCUTANEOUS | 11 refills | Status: AC

## 2019-01-19 MED ORDER — OXYCODONE HCL 5 MG/5ML PO SOLN: 5.0000 mg | ORAL | 0 refills | Status: AC | PRN

## 2019-01-19 MED ORDER — FUROSEMIDE 10 MG/ML IJ SOLN: 40.0000 mg | Freq: Two times a day (BID) | INTRAMUSCULAR | 0 refills | Status: AC

## 2019-01-19 MED ORDER — FENTANYL CITRATE (PF) 100 MCG/2ML IJ SOLN: 25.0000 ug | INTRAMUSCULAR | 0 refills | Status: AC | PRN

## 2019-01-19 MED ORDER — ENOXAPARIN SODIUM 40 MG/0.4ML ~~LOC~~ SOLN: 40.0000 mg | SUBCUTANEOUS | Status: AC

## 2019-01-19 MED ORDER — PANTOPRAZOLE SODIUM 40 MG PO PACK: 40.0000 mg | PACK | Freq: Every day | ORAL | Status: AC

## 2019-01-19 NOTE — Progress Notes (Signed)
Assisted tele visit to patient with daughter, Jacqlyn Larsen.  Maryelizabeth Rowan, RN
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
## 2019-01-19 NOTE — TOC Transition Note (Signed)
Transition of Care [facility]) - CM/SW Discharge Note   Patient Details  Name: Heather Lyons MRN: [id number] Date of Birth: 08-Sep-1934  [facility]) CM/SW Contact:  Ella Bodo, RN Phone Number: 01/19/2019, 2:55 PM   Clinical Narrative:   After speaking with both area [place] admission coordinators, daughter has [employer], broadcasting/film/video of [place] for LTAC needs.  Pt medically stable for dc to [facility] hospital today, per medical team.   Plan dc to LTAC/ [facility], Room #5, under care of Dr. Ebony Hail.  Bedside nurse to call report to [phone].  Daughter aware and is agreeable with transfer.  Please contact daughter when pt moved to [facility].      Final next level of care: Long Term Acute Care (LTAC) Barriers to Discharge: Barriers Resolved   Patient Goals and CMS Choice   CMS Medicare.gov Compare Post Acute Care list provided to:: Patient Represenative (must comment) Choice offered to / list presented to : Adult Children                        Discharge Plan and Services   Discharge Planning Services: CM Consult Post Acute Care Choice: Long Term Acute Care (LTAC)                                    Readmission Risk Interventions Readmission Risk Prevention Plan 01/19/2019  Transportation Screening Not Complete  Transportation Screening Comment DC to LTAC  Medication Review [employer]) Referral to Pharmacy  PCP or Specialist appointment within 3-5 days of discharge Not Complete  PCP/Specialist Appt Not Complete comments DC to [place] or [place] Not Complete  HRI or Home Care Consult Pt Refusal Comments DC to LTAC  SW Recovery Care/Counseling Consult Not Complete  SW Consult Not Complete Comments DC to [place] Not Applicable   Reinaldo Raddle, RN, BSN  Trauma/Neuro ICU Case Manager [phone]
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
## 2019-01-19 NOTE — Progress Notes (Signed)
RT obtained ABG on pt with the following results. No other vent changes needed at this time. Pt respiratory status stable at this time on FIO2 60% and PEEP 10 w/Sats of 99%. RT will continue to monitor.   Results for Heather Lyons, Heather Lyons (MRN [id number]) as of 01/19/2019 01:35  Ref. Range 01/19/2019 01:17  Sample type Unknown ARTERIAL  pH, Arterial Latest Ref Range: 7.350 - 7.450  7.513 (H)  pCO2 arterial Latest Ref Range: 32.0 - 48.0 mmHg 45.8  pO2, Arterial Latest Ref Range: 83.0 - 108.0 mmHg 87.0  TCO2 Latest Ref Range: 22 - 32 mmol/L 38 (H)  Acid-Base Excess Latest Ref Range: 0.0 - 2.0 mmol/L 12.0 (H)  Bicarbonate Latest Ref Range: 20.0 - 28.0 mmol/L 36.5 (H)  O2 Saturation Latest Units: % 97.0  Patient temperature Unknown 100.6 F  Collection site Unknown BRACHIAL ARTERY
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
## 2019-01-20 LAB — PROTIME-INR
INR: 1.2 (ref 0.8–1.2)
Prothrombin Time: 15 seconds (ref 11.4–15.2)

## 2019-01-20 LAB — PHOSPHORUS: Phosphorus: 2.6 mg/dL (ref 2.5–4.6)

## 2019-01-20 LAB — COMPREHENSIVE METABOLIC PANEL
ALT: 19 U/L (ref 0–44)
AST: 27 U/L (ref 15–41)
Albumin: 2.2 g/dL — ABNORMAL LOW (ref 3.5–5.0)
Alkaline Phosphatase: 114 U/L (ref 38–126)
Anion gap: 10 (ref 5–15)
BUN: 28 mg/dL — ABNORMAL HIGH (ref 8–23)
CO2: 30 mmol/L (ref 22–32)
Calcium: 8.6 mg/dL — ABNORMAL LOW (ref 8.9–10.3)
Chloride: 105 mmol/L (ref 98–111)
Creatinine, Ser: 0.62 mg/dL (ref 0.44–1.00)
GFR calc Af Amer: >60 mL/min (ref >60)
GFR calc non Af Amer: >60 mL/min (ref >60)
Glucose, Bld: 226 mg/dL — ABNORMAL HIGH (ref 70–99)
Potassium: 3.5 mmol/L (ref 3.5–5.1)
Sodium: 145 mmol/L (ref 135–145)
Total Bilirubin: 1.3 mg/dL — ABNORMAL HIGH (ref 0.3–1.2)
Total Protein: 6.9 g/dL (ref 6.5–8.1)

## 2019-01-20 LAB — CBC WITH DIFFERENTIAL/PLATELET
Abs Immature Granulocytes: 0.04 10*3/uL (ref 0.00–0.07)
Basophils Absolute: 0.1 10*3/uL (ref 0.0–0.1)
Basophils Relative: 1 %
Eosinophils Absolute: 0.2 10*3/uL (ref 0.0–0.5)
Eosinophils Relative: 3 %
HCT: 36.6 % (ref 36.0–46.0)
Hemoglobin: 10.7 g/dL — ABNORMAL LOW (ref 12.0–15.0)
Immature Granulocytes: 1 %
Lymphocytes Relative: 11 %
Lymphs Abs: 0.8 10*3/uL (ref 0.7–4.0)
MCH: 28.6 pg (ref 26.0–34.0)
MCHC: 29.2 g/dL — ABNORMAL LOW (ref 30.0–36.0)
MCV: 97.9 fL (ref 80.0–100.0)
Monocytes Absolute: 0.4 10*3/uL (ref 0.1–1.0)
Monocytes Relative: 7 %
Neutro Abs: 5.2 10*3/uL (ref 1.7–7.7)
Neutrophils Relative %: 77 %
Platelets: 407 10*3/uL — ABNORMAL HIGH (ref 150–400)
RBC: 3.74 MIL/uL — ABNORMAL LOW (ref 3.87–5.11)
RDW: 15.4 % (ref 11.5–15.5)
WBC: 6.7 10*3/uL (ref 4.0–10.5)
nRBC: 0 % (ref 0.0–0.2)

## 2019-01-20 LAB — MAGNESIUM: Magnesium: 2.1 mg/dL (ref 1.7–2.4)

## 2019-01-20 LAB — URINE CULTURE: Culture: NO GROWTH

## 2019-01-20 LAB — HEMOGLOBIN A1C
Hgb A1c MFr Bld: 7 % — ABNORMAL HIGH (ref 4.8–5.6)
Mean Plasma Glucose: 154.2 mg/dL

## 2019-01-20 LAB — TSH: TSH: 0.811 u[IU]/mL (ref 0.350–4.500)

## 2019-01-21 ENCOUNTER — Other Ambulatory Visit (HOSPITAL_COMMUNITY): Payer: Self-pay

## 2019-01-21 LAB — CULTURE, RESPIRATORY W GRAM STAIN: Culture: NORMAL

## 2019-01-21 LAB — RENAL FUNCTION PANEL
Albumin: 2.4 g/dL — ABNORMAL LOW (ref 3.5–5.0)
Anion gap: 14 (ref 5–15)
BUN: 33 mg/dL — ABNORMAL HIGH (ref 8–23)
CO2: 27 mmol/L (ref 22–32)
Calcium: 9.2 mg/dL (ref 8.9–10.3)
Chloride: 105 mmol/L (ref 98–111)
Creatinine, Ser: 0.69 mg/dL (ref 0.44–1.00)
GFR calc Af Amer: >60 mL/min (ref >60)
GFR calc non Af Amer: >60 mL/min (ref >60)
Glucose, Bld: 266 mg/dL — ABNORMAL HIGH (ref 70–99)
Phosphorus: 3.4 mg/dL (ref 2.5–4.6)
Potassium: 3.9 mmol/L (ref 3.5–5.1)
Sodium: 146 mmol/L — ABNORMAL HIGH (ref 135–145)

## 2019-01-21 LAB — CBC
HCT: 38.5 % (ref 36.0–46.0)
Hemoglobin: 11.8 g/dL — ABNORMAL LOW (ref 12.0–15.0)
MCH: 28.4 pg (ref 26.0–34.0)
MCHC: 30.6 g/dL (ref 30.0–36.0)
MCV: 92.8 fL (ref 80.0–100.0)
Platelets: 440 K/uL — ABNORMAL HIGH (ref 150–400)
RBC: 4.15 MIL/uL (ref 3.87–5.11)
RDW: 14.9 % (ref 11.5–15.5)
WBC: 12.9 K/uL — ABNORMAL HIGH (ref 4.0–10.5)
nRBC: 0 % (ref 0.0–0.2)

## 2019-01-21 LAB — CULTURE, RESPIRATORY: Culture: NORMAL

## 2019-01-21 LAB — MAGNESIUM: Magnesium: 2 mg/dL (ref 1.7–2.4)

## 2019-01-22 ENCOUNTER — Encounter: Payer: Self-pay | Admitting: Internal Medicine

## 2019-01-22 DIAGNOSIS — N170 Acute kidney failure with tubular necrosis: Secondary | ICD-10-CM | POA: Diagnosis present

## 2019-01-22 DIAGNOSIS — J9621 Acute and chronic respiratory failure with hypoxia: Secondary | ICD-10-CM

## 2019-01-22 DIAGNOSIS — S27322A Contusion of lung, bilateral, initial encounter: Secondary | ICD-10-CM | POA: Diagnosis present

## 2019-01-22 DIAGNOSIS — Z930 Tracheostomy status: Secondary | ICD-10-CM

## 2019-01-22 DIAGNOSIS — S27322S Contusion of lung, bilateral, sequela: Secondary | ICD-10-CM

## 2019-01-22 DIAGNOSIS — S271XXD Traumatic hemothorax, subsequent encounter: Secondary | ICD-10-CM

## 2019-01-22 LAB — BASIC METABOLIC PANEL
Anion gap: 12 (ref 5–15)
BUN: 36 mg/dL — ABNORMAL HIGH (ref 8–23)
CO2: 29 mmol/L (ref 22–32)
Calcium: 9.2 mg/dL (ref 8.9–10.3)
Chloride: 103 mmol/L (ref 98–111)
Creatinine, Ser: 0.82 mg/dL (ref 0.44–1.00)
GFR calc Af Amer: >60 mL/min (ref >60)
GFR calc non Af Amer: >60 mL/min (ref >60)
Glucose, Bld: 306 mg/dL — ABNORMAL HIGH (ref 70–99)
Potassium: 3.8 mmol/L (ref 3.5–5.1)
Sodium: 144 mmol/L (ref 135–145)

## 2019-01-22 LAB — BLOOD GAS, ARTERIAL
Acid-Base Excess: 9.1 mmol/L — ABNORMAL HIGH (ref 0.0–2.0)
Bicarbonate: 33.4 mmol/L — ABNORMAL HIGH (ref 20.0–28.0)
FIO2: 40
O2 Saturation: 95.5 %
Patient temperature: 98.6
pCO2 arterial: 49.1 mmHg — ABNORMAL HIGH (ref 32.0–48.0)
pH, Arterial: 7.448 (ref 7.350–7.450)
pO2, Arterial: 82.6 mmHg — ABNORMAL LOW (ref 83.0–108.0)

## 2019-01-22 LAB — MAGNESIUM: Magnesium: 2.1 mg/dL (ref 1.7–2.4)

## 2019-01-22 LAB — VANCOMYCIN, TROUGH: Vancomycin Tr: 14 ug/mL — ABNORMAL LOW (ref 15–20)
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
## 2019-01-23 DIAGNOSIS — Z9911 Dependence on respirator [ventilator] status: Secondary | ICD-10-CM

## 2019-01-23 NOTE — Progress Notes (Signed)
Pulmonary Critical Care Medicine [place]   PULMONARY CRITICAL CARE SERVICE  PROGRESS NOTE  Date of Service: 01/23/2019  Heather Lyons  [id number]:[id number]  DOB: Mar 04, 1935   DOA: 01/19/2019  Referring Physician: Merton Border, MD  HPI: Heather Lyons is a 84 y.o. female seen for follow up of Acute on Chronic Respiratory Failure.  Patient is on pressure support mode currently currently is on 35% FiO2 with a 16-hour goal  Medications: Reviewed on Rounds  Physical Exam:  Vitals: Temperature 98.0 pulse 87 respiratory rate 12 blood pressure 132/58 saturations 99%.   Ventilator Settings mode ventilation pressure support FiO2 35% pressure 12 PEEP 5  . General: Comfortable at this time . Eyes: Grossly normal lids, irises & conjunctiva . ENT: grossly tongue is normal . Neck: no obvious mass . Cardiovascular: S1 S2 normal no gallop . Respiratory: No rhonchi no rales are noted at this time . Abdomen: soft . Skin: no rash seen on limited exam . Musculoskeletal: not rigid . Psychiatric:unable to assess . Neurologic: no seizure no involuntary movements         Lab Data:   Basic Metabolic Panel: Recent Labs  Lab 01/20/19 0533 01/21/19 0945 01/22/19 0645  NA 145 146* 144  K 3.5 3.9 3.8  CL 105 105 103  CO2 30 27 29   GLUCOSE 226* 266* 306*  BUN 28* 33* 36*  CREATININE 0.62 0.69 0.82  CALCIUM 8.6* 9.2 9.2  MG 2.1 2.0 2.1  PHOS 2.6 3.4  --     ABG: Recent Labs  Lab 01/19/19 2130  PHART 7.448  PCO2ART 49.1*  PO2ART 82.6*  HCO3 33.4*  O2SAT 95.5    Liver Function Tests: Recent Labs  Lab 01/20/19 0533 01/21/19 0945  AST 27  --   ALT 19  --   ALKPHOS 114  --   BILITOT 1.3*  --   PROT 6.9  --   ALBUMIN 2.2* 2.4*   No results for input(s): LIPASE, AMYLASE in the last 168 hours. No results for input(s): AMMONIA in the last 168 hours.  CBC: Recent Labs  Lab 01/20/19 0533 01/21/19 0945  WBC 6.7 12.9*  NEUTROABS 5.2  --   HGB 10.7* 11.8*  HCT  36.6 38.5  MCV 97.9 92.8  PLT 407* 440*    Cardiac Enzymes: No results for input(s): CKTOTAL, CKMB, CKMBINDEX, TROPONINI in the last 168 hours.  BNP (last 3 results) No results for input(s): BNP in the last 8760 hours.  ProBNP (last 3 results) No results for input(s): PROBNP in the last 8760 hours.  Radiological Exams: No results found.  Assessment/Plan Active Problems:   Acute on chronic respiratory failure with hypoxia (HCC)   Hemothorax, traumatic, subsequent encounter   Bilateral pulmonary contusion   Acute renal failure due to tubular necrosis (HCC)   1. Acute on chronic respiratory failure with hypoxia we will continue with pressure support mode titrate oxygen as tolerated continue pulmonary toilet. 2. Hemothorax we will continue with supportive care follow radiologically 3. Bilateral pulmonary contusions clinically improving 4. Acute renal failure we will continue to follow labs   I have personally seen and evaluated the patient, evaluated laboratory and imaging results, formulated the assessment and plan and placed orders. The Patient requires high complexity decision making for assessment and support.  Case was discussed on Rounds with the Respiratory Therapy Staff  Allyne Gee, MD [facility] Pulmonary Critical Care Medicine Sleep Medicine

## 2019-01-24 LAB — BASIC METABOLIC PANEL
Anion gap: 14 (ref 5–15)
BUN: 42 mg/dL — ABNORMAL HIGH (ref 8–23)
CO2: 24 mmol/L (ref 22–32)
Calcium: 9.3 mg/dL (ref 8.9–10.3)
Chloride: 106 mmol/L (ref 98–111)
Creatinine, Ser: 0.73 mg/dL (ref 0.44–1.00)
GFR calc Af Amer: >60 mL/min (ref >60)
GFR calc non Af Amer: >60 mL/min (ref >60)
Glucose, Bld: 262 mg/dL — ABNORMAL HIGH (ref 70–99)
Potassium: 3.6 mmol/L (ref 3.5–5.1)
Sodium: 144 mmol/L (ref 135–145)

## 2019-01-24 LAB — CBC
HCT: 40.3 % (ref 36.0–46.0)
Hemoglobin: 12.1 g/dL (ref 12.0–15.0)
MCH: 28.3 pg (ref 26.0–34.0)
MCHC: 30 g/dL (ref 30.0–36.0)
MCV: 94.4 fL (ref 80.0–100.0)
Platelets: 354 10*3/uL (ref 150–400)
RBC: 4.27 MIL/uL (ref 3.87–5.11)
RDW: 14.7 % (ref 11.5–15.5)
WBC: 9.8 10*3/uL (ref 4.0–10.5)
nRBC: 0 % (ref 0.0–0.2)

## 2019-01-24 LAB — MAGNESIUM: Magnesium: 1.9 mg/dL (ref 1.7–2.4)

## 2019-01-24 NOTE — Progress Notes (Signed)
Pulmonary Critical Care Medicine [place]   PULMONARY CRITICAL CARE SERVICE  PROGRESS NOTE  Date of Service: 01/24/2019  Saranne Crislip  [id number]:[id number]  DOB: 11-10-1934   DOA: 01/19/2019  Referring Physician: Merton Border, MD  HPI: Tarren Sabree is a 84 y.o. female seen for follow up of Acute on Chronic Respiratory Failure.  Patient currently is on T collar has been on 35% FiO2 the goal is for 4 hours today  Medications: Reviewed on Rounds  Physical Exam:  Vitals: Temperature 98.0 pulse 87 respiratory rate 24 blood pressure 130/70 saturations 97%  Ventilator Settings off the ventilator on T collar FiO2 is 35%  . General: Comfortable at this time . Eyes: Grossly normal lids, irises & conjunctiva . ENT: grossly tongue is normal . Neck: no obvious mass . Cardiovascular: S1 S2 normal no gallop . Respiratory: No rhonchi no rales are noted at this time . Abdomen: soft . Skin: no rash seen on limited exam . Musculoskeletal: not rigid . Psychiatric:unable to assess . Neurologic: no seizure no involuntary movements         Lab Data:   Basic Metabolic Panel: Recent Labs  Lab 01/20/19 0533 01/21/19 0945 01/22/19 0645 01/24/19 0559  NA 145 146* 144 144  K 3.5 3.9 3.8 3.6  CL 105 105 103 106  CO2 30 27 29 24   GLUCOSE 226* 266* 306* 262*  BUN 28* 33* 36* 42*  CREATININE 0.62 0.69 0.82 0.73  CALCIUM 8.6* 9.2 9.2 9.3  MG 2.1 2.0 2.1 1.9  PHOS 2.6 3.4  --   --     ABG: Recent Labs  Lab 01/19/19 2130  PHART 7.448  PCO2ART 49.1*  PO2ART 82.6*  HCO3 33.4*  O2SAT 95.5    Liver Function Tests: Recent Labs  Lab 01/20/19 0533 01/21/19 0945  AST 27  --   ALT 19  --   ALKPHOS 114  --   BILITOT 1.3*  --   PROT 6.9  --   ALBUMIN 2.2* 2.4*   No results for input(s): LIPASE, AMYLASE in the last 168 hours. No results for input(s): AMMONIA in the last 168 hours.  CBC: Recent Labs  Lab 01/20/19 0533 01/21/19 0945 01/24/19 0559  WBC 6.7 12.9* 9.8   NEUTROABS 5.2  --   --   HGB 10.7* 11.8* 12.1  HCT 36.6 38.5 40.3  MCV 97.9 92.8 94.4  PLT 407* 440* 354    Cardiac Enzymes: No results for input(s): CKTOTAL, CKMB, CKMBINDEX, TROPONINI in the last 168 hours.  BNP (last 3 results) No results for input(s): BNP in the last 8760 hours.  ProBNP (last 3 results) No results for input(s): PROBNP in the last 8760 hours.  Radiological Exams: No results found.  Assessment/Plan Active Problems:   Acute on chronic respiratory failure with hypoxia (HCC)   Hemothorax, traumatic, subsequent encounter   Bilateral pulmonary contusion   Acute renal failure due to tubular necrosis (HCC)   1. Acute on chronic respiratory failure with hypoxia we will continue with T collar goal is 4 hours 2. Hemothorax at baseline we will continue with present management 3. Bilateral pulmonary contusion improving 4. Acute renal failure continue to follow labs   I have personally seen and evaluated the patient, evaluated laboratory and imaging results, formulated the assessment and plan and placed orders. The Patient requires high complexity decision making for assessment and support.  Case was discussed on Rounds with the Respiratory Therapy Staff  Allyne Gee, MD [facility] Pulmonary  Critical Care Medicine Sleep Medicine

## 2019-01-25 NOTE — Progress Notes (Signed)
Pulmonary Critical Care Medicine [place]   PULMONARY CRITICAL CARE SERVICE  PROGRESS NOTE  Date of Service: 01/25/2019  Heather Lyons  [id number]:[id number]  DOB: [birth year]   DOA: 01/19/2019  Referring Physician: Merton Border, MD  HPI: Heather Lyons is a 84 y.o. female seen for follow up of Acute on Chronic Respiratory Failure.  Currently patient is on pressure support mode is on 30% FiO2 with weaning and is actually tolerating it well  Medications: Reviewed on Rounds  Physical Exam:  Vitals: Temperature 97.9 pulse 83 respiratory 24 blood pressure 130/71 saturations 95%  Ventilator Settings mode ventilation pressure support FiO2 30% tidal volume 579 pressure support 12 PEEP 5  . General: Comfortable at this time . Eyes: Grossly normal lids, irises & conjunctiva . ENT: grossly tongue is normal . Neck: no obvious mass . Cardiovascular: S1 S2 normal no gallop . Respiratory: No rhonchi no rales are noted at this time . Abdomen: soft . Skin: no rash seen on limited exam . Musculoskeletal: not rigid . Psychiatric:unable to assess . Neurologic: no seizure no involuntary movements         Lab Data:   Basic Metabolic Panel: Recent Labs  Lab 01/20/19 0533 01/21/19 0945 01/22/19 0645 01/24/19 0559  NA 145 146* 144 144  K 3.5 3.9 3.8 3.6  CL 105 105 103 106  CO2 30 27 29 24   GLUCOSE 226* 266* 306* 262*  BUN 28* 33* 36* 42*  CREATININE 0.62 0.69 0.82 0.73  CALCIUM 8.6* 9.2 9.2 9.3  MG 2.1 2.0 2.1 1.9  PHOS 2.6 3.4  --   --     ABG: Recent Labs  Lab 01/19/19 2130  PHART 7.448  PCO2ART 49.1*  PO2ART 82.6*  HCO3 33.4*  O2SAT 95.5    Liver Function Tests: Recent Labs  Lab 01/20/19 0533 01/21/19 0945  AST 27  --   ALT 19  --   ALKPHOS 114  --   BILITOT 1.3*  --   PROT 6.9  --   ALBUMIN 2.2* 2.4*   No results for input(s): LIPASE, AMYLASE in the last 168 hours. No results for input(s): AMMONIA in the last 168 hours.  CBC: Recent Labs  Lab  01/20/19 0533 01/21/19 0945 01/24/19 0559  WBC 6.7 12.9* 9.8  NEUTROABS 5.2  --   --   HGB 10.7* 11.8* 12.1  HCT 36.6 38.5 40.3  MCV 97.9 92.8 94.4  PLT 407* 440* 354    Cardiac Enzymes: No results for input(s): CKTOTAL, CKMB, CKMBINDEX, TROPONINI in the last 168 hours.  BNP (last 3 results) No results for input(s): BNP in the last 8760 hours.  ProBNP (last 3 results) No results for input(s): PROBNP in the last 8760 hours.  Radiological Exams: No results found.  Assessment/Plan Active Problems:   Acute on chronic respiratory failure with hypoxia (HCC)   Hemothorax, traumatic, subsequent encounter   Bilateral pulmonary contusion   Acute renal failure due to tubular necrosis (HCC)   1. Acute on chronic respiratory failure with hypoxia we will continue with pressure support wean as tolerated titrate oxygen down as tolerated 2. Hemothorax stable we will continue to follow radiologically 3. Pulmonary contusion at baseline 4. Acute renal failure following up on labs continue with supportive care   I have personally seen and evaluated the patient, evaluated laboratory and imaging results, formulated the assessment and plan and placed orders. The Patient requires high complexity decision making for assessment and support.  Case was discussed on Rounds  with the Respiratory Therapy Staff  Allyne Gee, MD [facility] Pulmonary Critical Care Medicine Sleep Medicine

## 2019-01-26 NOTE — Progress Notes (Addendum)
Pulmonary Critical Care Medicine [place]   PULMONARY CRITICAL CARE SERVICE  PROGRESS NOTE  Date of Service: 01/26/2019  Heather Lyons  [id number]:[id number]  DOB: 1934-09-09   DOA: 01/19/2019  Referring Physician: Merton Border, MD  HPI: Heather Lyons is a 84 y.o. female seen for follow up of Acute on Chronic Respiratory Failure.  Patient continues on 30% aerosol trach collar at this time for 12-hour goal today.  Satting well no distress.  Medications: Reviewed on Rounds  Physical Exam:  Vitals: Pulse 95 respiration 17 BP 135/71 O2 sat 96% temp 7.9  Ventilator Settings ATC 30%  . General: Comfortable at this time . Eyes: Grossly normal lids, irises & conjunctiva . ENT: grossly tongue is normal . Neck: no obvious mass . Cardiovascular: S1 S2 normal no gallop . Respiratory: No rales or rhonchi noted . Abdomen: soft . Skin: no rash seen on limited exam . Musculoskeletal: not rigid . Psychiatric:unable to assess . Neurologic: no seizure no involuntary movements         Lab Data:   Basic Metabolic Panel: Recent Labs  Lab 01/20/19 0533 01/21/19 0945 01/22/19 0645 01/24/19 0559  NA 145 146* 144 144  K 3.5 3.9 3.8 3.6  CL 105 105 103 106  CO2 30 27 29 24   GLUCOSE 226* 266* 306* 262*  BUN 28* 33* 36* 42*  CREATININE 0.62 0.69 0.82 0.73  CALCIUM 8.6* 9.2 9.2 9.3  MG 2.1 2.0 2.1 1.9  PHOS 2.6 3.4  --   --     ABG: Recent Labs  Lab 01/19/19 2130  PHART 7.448  PCO2ART 49.1*  PO2ART 82.6*  HCO3 33.4*  O2SAT 95.5    Liver Function Tests: Recent Labs  Lab 01/20/19 0533 01/21/19 0945  AST 27  --   ALT 19  --   ALKPHOS 114  --   BILITOT 1.3*  --   PROT 6.9  --   ALBUMIN 2.2* 2.4*   No results for input(s): LIPASE, AMYLASE in the last 168 hours. No results for input(s): AMMONIA in the last 168 hours.  CBC: Recent Labs  Lab 01/20/19 0533 01/21/19 0945 01/24/19 0559  WBC 6.7 12.9* 9.8  NEUTROABS 5.2  --   --   HGB 10.7* 11.8* 12.1  HCT  36.6 38.5 40.3  MCV 97.9 92.8 94.4  PLT 407* 440* 354    Cardiac Enzymes: No results for input(s): CKTOTAL, CKMB, CKMBINDEX, TROPONINI in the last 168 hours.  BNP (last 3 results) No results for input(s): BNP in the last 8760 hours.  ProBNP (last 3 results) No results for input(s): PROBNP in the last 8760 hours.  Radiological Exams: No results found.  Assessment/Plan Active Problems:   Acute on chronic respiratory failure with hypoxia (HCC)   Hemothorax, traumatic, subsequent encounter   Bilateral pulmonary contusion   Acute renal failure due to tubular necrosis (HCC)   1. Acute on chronic respiratory failure with hypoxia we will continue with pressure support wean as tolerated titrate oxygen down as tolerated 2. Hemothorax stable we will continue to follow radiologically 3. Pulmonary contusion at baseline 4. Acute renal failure following up on labs continue with supportive care   I have personally seen and evaluated the patient, evaluated laboratory and imaging results, formulated the assessment and plan and placed orders. The Patient requires high complexity decision making for assessment and support.  Case was discussed on Rounds with the Respiratory Therapy Staff  Allyne Gee, MD [facility] Pulmonary Critical Care Medicine Sleep  Medicine

## 2019-01-27 LAB — MAGNESIUM: Magnesium: 2 mg/dL (ref 1.7–2.4)

## 2019-01-27 LAB — BASIC METABOLIC PANEL
Anion gap: 10 (ref 5–15)
BUN: 34 mg/dL — ABNORMAL HIGH (ref 8–23)
CO2: 30 mmol/L (ref 22–32)
Calcium: 9.6 mg/dL (ref 8.9–10.3)
Chloride: 104 mmol/L (ref 98–111)
Creatinine, Ser: 0.81 mg/dL (ref 0.44–1.00)
GFR calc Af Amer: >60 mL/min (ref >60)
GFR calc non Af Amer: >60 mL/min (ref >60)
Glucose, Bld: 251 mg/dL — ABNORMAL HIGH (ref 70–99)
Potassium: 4 mmol/L (ref 3.5–5.1)
Sodium: 144 mmol/L (ref 135–145)

## 2019-01-27 LAB — CBC
HCT: 43.6 % (ref 36.0–46.0)
Hemoglobin: 12.7 g/dL (ref 12.0–15.0)
MCH: 27.5 pg (ref 26.0–34.0)
MCHC: 29.1 g/dL — ABNORMAL LOW (ref 30.0–36.0)
MCV: 94.6 fL (ref 80.0–100.0)
Platelets: 374 10*3/uL (ref 150–400)
RBC: 4.61 MIL/uL (ref 3.87–5.11)
RDW: 14.8 % (ref 11.5–15.5)
WBC: 11.1 10*3/uL — ABNORMAL HIGH (ref 4.0–10.5)
nRBC: 0 % (ref 0.0–0.2)

## 2019-01-28 NOTE — Progress Notes (Signed)
Pulmonary Critical Care Medicine [place]   PULMONARY CRITICAL CARE SERVICE  PROGRESS NOTE  Date of Service: 01/28/2019  Neely Kammerer  [id number]  DOB: 04-26-1935   DOA: 01/19/2019  Referring Physician: Merton Border, MD  HPI: Heather Lyons is a 84 y.o. female seen for follow up of Acute on Chronic Respiratory Failure.  Patient currently is on T care collar until the goal is for 20 hours  Medications: Reviewed on Rounds  Physical Exam:  Vitals: Temperature 97.0 pulse 88 respiratory rate 20 blood pressure 120/58 saturations 95%  Ventilator Settings off the ventilator on T collar  . General: Comfortable at this time . Eyes: Grossly normal lids, irises & conjunctiva . ENT: grossly tongue is normal . Neck: no obvious mass . Cardiovascular: S1 S2 normal no gallop . Respiratory: No rhonchi no rales are noted at this time . Abdomen: soft . Skin: no rash seen on limited exam . Musculoskeletal: not rigid . Psychiatric:unable to assess . Neurologic: no seizure no involuntary movements         Lab Data:   Basic Metabolic Panel: Recent Labs  Lab 01/22/19 0645 01/24/19 0559 01/27/19 0644  NA 144 144 144  K 3.8 3.6 4.0  CL 103 106 104  CO2 29 24 30   GLUCOSE 306* 262* 251*  BUN 36* 42* 34*  CREATININE 0.82 0.73 0.81  CALCIUM 9.2 9.3 9.6  MG 2.1 1.9 2.0    ABG: No results for input(s): PHART, PCO2ART, PO2ART, HCO3, O2SAT in the last 168 hours.  Liver Function Tests: No results for input(s): AST, ALT, ALKPHOS, BILITOT, PROT, ALBUMIN in the last 168 hours. No results for input(s): LIPASE, AMYLASE in the last 168 hours. No results for input(s): AMMONIA in the last 168 hours.  CBC: Recent Labs  Lab 01/24/19 0559 01/27/19 0644  WBC 9.8 11.1*  HGB 12.1 12.7  HCT 40.3 43.6  MCV 94.4 94.6  PLT 354 374    Cardiac Enzymes: No results for input(s): CKTOTAL, CKMB, CKMBINDEX, TROPONINI in the last 168 hours.  BNP (last 3 results) No results for  input(s): BNP in the last 8760 hours.  ProBNP (last 3 results) No results for input(s): PROBNP in the last 8760 hours.  Radiological Exams: No results found.  Assessment/Plan Active Problems:   Acute on chronic respiratory failure with hypoxia (HCC)   Hemothorax, traumatic, subsequent encounter   Bilateral pulmonary contusion   Acute renal failure due to tubular necrosis (HCC)   1. Acute on chronic respiratory failure with hypoxia continue with T collar wean on 28% FiO2 goal for the wean today is 20 hours 2. Hemothorax traumatic we will continue to follow follow-up radiologically 3. Bilateral pulmonary contusion showing signs of improvement 4. Acute renal failure labs have shown improvement we will continue to follow   I have personally seen and evaluated the patient, evaluated laboratory and imaging results, formulated the assessment and plan and placed orders. The Patient requires high complexity decision making for assessment and support.  Case was discussed on Rounds with the Respiratory Therapy Staff  Allyne Gee, MD [facility] Pulmonary Critical Care Medicine Sleep Medicine

## 2019-01-29 NOTE — Progress Notes (Signed)
Pulmonary Critical Care Medicine [place]   PULMONARY CRITICAL CARE SERVICE  PROGRESS NOTE  Date of Service: 01/29/2019  Heather Lyons  OEU:[id number]  DOB: [birth year]   DOA: 01/19/2019  Referring Physician: Merton Border, MD  HPI: Heather Lyons is a 84 y.o. female seen for follow up of Acute on Chronic Respiratory Failure.  Patient currently is on T collar was able to do about 20 hours and is tolerating it well so we are going to continue to advance on T collar  Medications: Reviewed on Rounds  Physical Exam:  Vitals: Temperature 97.1 pulse 83 respiratory 19 blood pressure 144/66 saturations 95%  Ventilator Settings off the ventilator on T collar at this time  . General: Comfortable at this time . Eyes: Grossly normal lids, irises & conjunctiva . ENT: grossly tongue is normal . Neck: no obvious mass . Cardiovascular: S1 S2 normal no gallop . Respiratory: No rhonchi no rales are noted . Abdomen: soft . Skin: no rash seen on limited exam . Musculoskeletal: not rigid . Psychiatric:unable to assess . Neurologic: no seizure no involuntary movements         Lab Data:   Basic Metabolic Panel: Recent Labs  Lab 01/24/19 0559 01/27/19 0644  NA 144 144  K 3.6 4.0  CL 106 104  CO2 24 30  GLUCOSE 262* 251*  BUN 42* 34*  CREATININE 0.73 0.81  CALCIUM 9.3 9.6  MG 1.9 2.0    ABG: No results for input(s): PHART, PCO2ART, PO2ART, HCO3, O2SAT in the last 168 hours.  Liver Function Tests: No results for input(s): AST, ALT, ALKPHOS, BILITOT, PROT, ALBUMIN in the last 168 hours. No results for input(s): LIPASE, AMYLASE in the last 168 hours. No results for input(s): AMMONIA in the last 168 hours.  CBC: Recent Labs  Lab 01/24/19 0559 01/27/19 0644  WBC 9.8 11.1*  HGB 12.1 12.7  HCT 40.3 43.6  MCV 94.4 94.6  PLT 354 374    Cardiac Enzymes: No results for input(s): CKTOTAL, CKMB, CKMBINDEX, TROPONINI in the last 168 hours.  BNP (last 3 results) No  results for input(s): BNP in the last 8760 hours.  ProBNP (last 3 results) No results for input(s): PROBNP in the last 8760 hours.  Radiological Exams: No results found.  Assessment/Plan Active Problems:   Acute on chronic respiratory failure with hypoxia (HCC)   Hemothorax, traumatic, subsequent encounter   Bilateral pulmonary contusion   Acute renal failure due to tubular necrosis (HCC)   1. Acute on chronic respiratory failure with hypoxia we will continue with T collar trials titrate oxygen continue pulmonary toilet. 2. Traumatic hemothorax at baseline we will continue with supportive care 3. Bilateral pleural pulmonary contusions improving 4. Acute renal failure we are following labs continue with supportive care   I have personally seen and evaluated the patient, evaluated laboratory and imaging results, formulated the assessment and plan and placed orders. The Patient requires high complexity decision making for assessment and support.  Case was discussed on Rounds with the Respiratory Therapy Staff  Allyne Gee, MD [facility] Pulmonary Critical Care Medicine Sleep Medicine

## 2019-01-30 ENCOUNTER — Institutional Professional Consult (permissible substitution) (HOSPITAL_COMMUNITY): Payer: Self-pay

## 2019-01-30 ENCOUNTER — Other Ambulatory Visit (HOSPITAL_COMMUNITY): Payer: Self-pay

## 2019-01-30 DIAGNOSIS — S92014A Nondisplaced fracture of body of right calcaneus, initial encounter for closed fracture: Secondary | ICD-10-CM

## 2019-01-30 DIAGNOSIS — E43 Unspecified severe protein-calorie malnutrition: Secondary | ICD-10-CM

## 2019-01-30 DIAGNOSIS — L89611 Pressure ulcer of right heel, stage 1: Secondary | ICD-10-CM

## 2019-01-30 DIAGNOSIS — E1142 Type 2 diabetes mellitus with diabetic polyneuropathy: Secondary | ICD-10-CM

## 2019-01-30 LAB — BASIC METABOLIC PANEL
Anion gap: 13 (ref 5–15)
BUN: 54 mg/dL — ABNORMAL HIGH (ref 8–23)
CO2: 29 mmol/L (ref 22–32)
Calcium: 10.1 mg/dL (ref 8.9–10.3)
Chloride: 101 mmol/L (ref 98–111)
Creatinine, Ser: 0.94 mg/dL (ref 0.44–1.00)
GFR calc Af Amer: >60 mL/min (ref >60)
GFR calc non Af Amer: 57 mL/min — ABNORMAL LOW (ref >60)
Glucose, Bld: 252 mg/dL — ABNORMAL HIGH (ref 70–99)
Potassium: 4.2 mmol/L (ref 3.5–5.1)
Sodium: 143 mmol/L (ref 135–145)

## 2019-01-30 LAB — CBC
HCT: 42.5 % (ref 36.0–46.0)
Hemoglobin: 12.6 g/dL (ref 12.0–15.0)
MCH: 27.6 pg (ref 26.0–34.0)
MCHC: 29.6 g/dL — ABNORMAL LOW (ref 30.0–36.0)
MCV: 93.2 fL (ref 80.0–100.0)
Platelets: 396 10*3/uL (ref 150–400)
RBC: 4.56 MIL/uL (ref 3.87–5.11)
RDW: 14.4 % (ref 11.5–15.5)
WBC: 11.6 10*3/uL — ABNORMAL HIGH (ref 4.0–10.5)
nRBC: 0 % (ref 0.0–0.2)

## 2019-01-30 LAB — PHOSPHORUS: Phosphorus: 4.2 mg/dL (ref 2.5–4.6)

## 2019-01-30 LAB — MAGNESIUM: Magnesium: 2.1 mg/dL (ref 1.7–2.4)
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
## 2019-01-30 NOTE — Progress Notes (Signed)
Pulmonary Critical Care Medicine [place]   PULMONARY CRITICAL CARE SERVICE  PROGRESS NOTE  Date of Service: 01/30/2019  Heather Lyons  YKD:[id number]  DOB: 1934/08/28   DOA: 01/19/2019  Referring Physician: Merton Border, MD  HPI: Heather Lyons is a 84 y.o. female seen for follow up of Acute on Chronic Respiratory Failure.  Patient is on T collar currently on 28% FiO2 has been on trach collar for 48 hours  Medications: Reviewed on Rounds  Physical Exam:  Vitals: Temperature 97.0 pulse 84 respiratory 24 blood pressure 117/58 saturations 97%  Ventilator Settings off the ventilator on T collar currently  . General: Comfortable at this time . Eyes: Grossly normal lids, irises & conjunctiva . ENT: grossly tongue is normal . Neck: no obvious mass . Cardiovascular: S1 S2 normal no gallop . Respiratory: No rhonchi no rales are noted at this time . Abdomen: soft . Skin: no rash seen on limited exam . Musculoskeletal: not rigid . Psychiatric:unable to assess . Neurologic: no seizure no involuntary movements         Lab Data:   Basic Metabolic Panel: Recent Labs  Lab 01/24/19 0559 01/27/19 0644 01/30/19 0707  NA 144 144 143  K 3.6 4.0 4.2  CL 106 104 101  CO2 24 30 29   GLUCOSE 262* 251* 252*  BUN 42* 34* 54*  CREATININE 0.73 0.81 0.94  CALCIUM 9.3 9.6 10.1  MG 1.9 2.0 2.1  PHOS  --   --  4.2    ABG: No results for input(s): PHART, PCO2ART, PO2ART, HCO3, O2SAT in the last 168 hours.  Liver Function Tests: No results for input(s): AST, ALT, ALKPHOS, BILITOT, PROT, ALBUMIN in the last 168 hours. No results for input(s): LIPASE, AMYLASE in the last 168 hours. No results for input(s): AMMONIA in the last 168 hours.  CBC: Recent Labs  Lab 01/24/19 0559 01/27/19 0644 01/30/19 0707  WBC 9.8 11.1* 11.6*  HGB 12.1 12.7 12.6  HCT 40.3 43.6 42.5  MCV 94.4 94.6 93.2  PLT 354 374 396    Cardiac Enzymes: No results for input(s): CKTOTAL, CKMB,  CKMBINDEX, TROPONINI in the last 168 hours.  BNP (last 3 results) No results for input(s): BNP in the last 8760 hours.  ProBNP (last 3 results) No results for input(s): PROBNP in the last 8760 hours.  Radiological Exams: No results found.  Assessment/Plan Active Problems:   Acute on chronic respiratory failure with hypoxia (HCC)   Hemothorax, traumatic, subsequent encounter   Bilateral pulmonary contusion   Acute renal failure due to tubular necrosis (HCC)   1. Acute on chronic respiratory failure hypoxia we will continue with T collar trials advance as tolerated patient is completed 48 hours should be able to start PMV soon 2. Hemothorax stable we will continue to monitor radiologically as necessary 3. Bilateral pulmonary contusions clinically improving 4. Acute renal failure labs are being followed overall improving slight increase in BUN was noted   I have personally seen and evaluated the patient, evaluated laboratory and imaging results, formulated the assessment and plan and placed orders. The Patient requires high complexity decision making for assessment and support.  Case was discussed on Rounds with the Respiratory Therapy Staff  Allyne Gee, MD [facility] Pulmonary Critical Care Medicine Sleep Medicine

## 2019-01-31 NOTE — Progress Notes (Addendum)
Pulmonary Critical Care Medicine [place]   PULMONARY CRITICAL CARE SERVICE  PROGRESS NOTE  Date of Service: 01/31/2019  Heather Lyons  JKD:[id number]  DOB: January 17, 1935   DOA: 01/19/2019  Referring Physician: Merton Border, MD  HPI: Heather Lyons is a 84 y.o. female seen for follow up of Acute on Chronic Respiratory Failure.  Patient remains on aerosol trach collar 28% FiO2 using PMV with no difficulty.  Medications: Reviewed on Rounds  Physical Exam:  Vitals: Pulse 91 respirations 18 BP 124/76 O2 sat 95% 97.6  Ventilator Settings ATC 28%  . General: Comfortable at this time . Eyes: Grossly normal lids, irises & conjunctiva . ENT: grossly tongue is normal . Neck: no obvious mass . Cardiovascular: S1 S2 normal no gallop . Respiratory: No rales or rhonchi noted . Abdomen: soft . Skin: no rash seen on limited exam . Musculoskeletal: not rigid . Psychiatric:unable to assess . Neurologic: no seizure no involuntary movements         Lab Data:   Basic Metabolic Panel: Recent Labs  Lab 01/27/19 0644 01/30/19 0707  NA 144 143  K 4.0 4.2  CL 104 101  CO2 30 29  GLUCOSE 251* 252*  BUN 34* 54*  CREATININE 0.81 0.94  CALCIUM 9.6 10.1  MG 2.0 2.1  PHOS  --  4.2    ABG: No results for input(s): PHART, PCO2ART, PO2ART, HCO3, O2SAT in the last 168 hours.  Liver Function Tests: No results for input(s): AST, ALT, ALKPHOS, BILITOT, PROT, ALBUMIN in the last 168 hours. No results for input(s): LIPASE, AMYLASE in the last 168 hours. No results for input(s): AMMONIA in the last 168 hours.  CBC: Recent Labs  Lab 01/27/19 0644 01/30/19 0707  WBC 11.1* 11.6*  HGB 12.7 12.6  HCT 43.6 42.5  MCV 94.6 93.2  PLT 374 396    Cardiac Enzymes: No results for input(s): CKTOTAL, CKMB, CKMBINDEX, TROPONINI in the last 168 hours.  BNP (last 3 results) No results for input(s): BNP in the last 8760 hours.  ProBNP (last 3 results) No results for input(s): PROBNP  in the last 8760 hours.  Radiological Exams: Dg Ankle 2 Views Right  Result Date: 01/30/2019 CLINICAL DATA:  Fracture EXAM: RIGHT ANKLE - 2 VIEW COMPARISON:  January 30, 2019 FINDINGS: There is an overlying plaster splint obscures details. Again noted is a calcaneal fracture, better visualized on prior radiographs. There is surrounding soft tissue swelling. There are degenerative changes of the mortise joint. No additional displaced fracture identified on this exam. IMPRESSION: Stable appearance of the previously noted calcaneal fracture. Electronically Signed   By: Constance Holster M.D.   On: 01/30/2019 15:43   Dg Os Calcis Right  Result Date: 01/30/2019 CLINICAL DATA:  Calcaneal fracture EXAM: RIGHT OS CALCIS - 2+ VIEW COMPARISON:  01/03/2019, 01/18/2019 FINDINGS: Overlying splint material degrades evaluation of fine osseous detail. Redemonstrated obliquely oriented fracture through the body of the calcaneus extending to the subtalar joint. No appreciable change in alignment. Suggestion of sclerosis along the fracture margins indicative of a healing response. IMPRESSION: Healing calcaneal body fracture without change in alignment compared to prior. Electronically Signed   By: Davina Poke M.D.   On: 01/30/2019 13:21    Assessment/Plan Active Problems:   Acute on chronic respiratory failure with hypoxia (HCC)   Hemothorax, traumatic, subsequent encounter   Bilateral pulmonary contusion   Acute renal failure due to tubular necrosis (HCC)   Closed nondisplaced fracture of body of right calcaneus  Diabetic polyneuropathy associated with type 2 diabetes mellitus (HCC)   Severe protein-calorie malnutrition (HCC)   Pressure injury of right heel, stage 1   1. Acute on chronic respiratory failure hypoxia continue the use at this time.  Continue supportive measures and pulmonary toilet. 2. Hemothorax stable we will continue to monitor radiologically as necessary 3. Bilateral pulmonary contusions  clinically improving 4. Acute renal failure labs are being followed overall improving slight increase in BUN was noted   I have personally seen and evaluated the patient, evaluated laboratory and imaging results, formulated the assessment and plan and placed orders. The Patient requires high complexity decision making for assessment and support.  Case was discussed on Rounds with the Respiratory Therapy Staff  Yevonne PaxSaadat A Ermagene Saidi, MD [facility] Pulmonary Critical Care Medicine Sleep Medicine

## 2019-02-01 LAB — BASIC METABOLIC PANEL
Anion gap: 16 — ABNORMAL HIGH (ref 5–15)
BUN: 70 mg/dL — ABNORMAL HIGH (ref 8–23)
CO2: 28 mmol/L (ref 22–32)
Calcium: 10.1 mg/dL (ref 8.9–10.3)
Chloride: 96 mmol/L — ABNORMAL LOW (ref 98–111)
Creatinine, Ser: 0.88 mg/dL (ref 0.44–1.00)
GFR calc Af Amer: >60 mL/min (ref >60)
GFR calc non Af Amer: >60 mL/min (ref >60)
Glucose, Bld: 283 mg/dL — ABNORMAL HIGH (ref 70–99)
Potassium: 4.5 mmol/L (ref 3.5–5.1)
Sodium: 140 mmol/L (ref 135–145)

## 2019-02-01 LAB — MAGNESIUM: Magnesium: 2.3 mg/dL (ref 1.7–2.4)

## 2019-02-01 NOTE — Progress Notes (Addendum)
Pulmonary Critical Care Medicine [place]   PULMONARY CRITICAL CARE SERVICE  PROGRESS NOTE  Date of Service: 02/01/2019  Heather Lyons  MOQ:[id number]  DOB: [birth year]   DOA: 01/19/2019  Referring Physician: Merton Border, MD  HPI: Heather Lyons is a 84 y.o. female seen for follow up of Acute on Chronic Respiratory Failure.  Patient remains on aerosol trach collar 28% FiO2 using PMV with no difficulty.  Moderately thick secretions are noted.  Medications: Reviewed on Rounds  Physical Exam:  Vitals: Pulse 88 respirations 18 BP 128/75 O2 sat 97% temp 97.2  Ventilator Settings ATC 28%  . General: Comfortable at this time . Eyes: Grossly normal lids, irises & conjunctiva . ENT: grossly tongue is normal . Neck: no obvious mass . Cardiovascular: S1 S2 normal no gallop . Respiratory: No rales rhonchi noted . Abdomen: soft . Skin: no rash seen on limited exam . Musculoskeletal: not rigid . Psychiatric:unable to assess . Neurologic: no seizure no involuntary movements         Lab Data:   Basic Metabolic Panel: Recent Labs  Lab 01/27/19 0644 01/30/19 0707 02/01/19 0516  NA 144 143 140  K 4.0 4.2 4.5  CL 104 101 96*  CO2 30 29 28   GLUCOSE 251* 252* 283*  BUN 34* 54* 70*  CREATININE 0.81 0.94 0.88  CALCIUM 9.6 10.1 10.1  MG 2.0 2.1 2.3  PHOS  --  4.2  --     ABG: No results for input(s): PHART, PCO2ART, PO2ART, HCO3, O2SAT in the last 168 hours.  Liver Function Tests: No results for input(s): AST, ALT, ALKPHOS, BILITOT, PROT, ALBUMIN in the last 168 hours. No results for input(s): LIPASE, AMYLASE in the last 168 hours. No results for input(s): AMMONIA in the last 168 hours.  CBC: Recent Labs  Lab 01/27/19 0644 01/30/19 0707  WBC 11.1* 11.6*  HGB 12.7 12.6  HCT 43.6 42.5  MCV 94.6 93.2  PLT 374 396    Cardiac Enzymes: No results for input(s): CKTOTAL, CKMB, CKMBINDEX, TROPONINI in the last 168 hours.  BNP (last 3 results) No results  for input(s): BNP in the last 8760 hours.  ProBNP (last 3 results) No results for input(s): PROBNP in the last 8760 hours.  Radiological Exams: No results found.  Assessment/Plan Active Problems:   Acute on chronic respiratory failure with hypoxia (HCC)   Hemothorax, traumatic, subsequent encounter   Bilateral pulmonary contusion   Acute renal failure due to tubular necrosis (HCC)   Closed nondisplaced fracture of body of right calcaneus   Diabetic polyneuropathy associated with type 2 diabetes mellitus (HCC)   Severe protein-calorie malnutrition (HCC)   Pressure injury of right heel, stage 1   1. Acute on chronic respiratory failure hypoxia  continue use aerosol trach collar 28% at this time..  Continue supportive measures and pulmonary toilet. 2. Hemothorax stable we will continue to monitor radiologically as necessary 3. Bilateral pulmonary contusions clinically improving 4. Acute renal failure labs are being followed overall improving slight increase in BUN was noted   I have personally seen and evaluated the patient, evaluated laboratory and imaging results, formulated the assessment and plan and placed orders. The Patient requires high complexity decision making for assessment and support.  Case was discussed on Rounds with the Respiratory Therapy Staff  Allyne Gee, MD [facility] Pulmonary Critical Care Medicine Sleep Medicine

## 2019-02-02 NOTE — Progress Notes (Addendum)
Pulmonary Critical Care Medicine [place]   PULMONARY CRITICAL CARE SERVICE  PROGRESS NOTE  Date of Service: 02/02/2019  Heather Lyons  BSJ:[id number]  DOB: 09-23-1934   DOA: 01/19/2019  Referring Physician: Merton Border, MD  HPI: Heather Lyons is a 84 y.o. female seen for follow up of Acute on Chronic Respiratory Failure.  Patient continues on aerosol trach collar 28% satting well with no fever or distress noted.  Medications: Reviewed on Rounds  Physical Exam:  Vitals: Pulse 94 respirations 18 BP 130/71 O2 sat 98% temp 98.4  Ventilator Settings ATC 28  . General: Comfortable at this time . Eyes: Grossly normal lids, irises & conjunctiva . ENT: grossly tongue is normal . Neck: no obvious mass . Cardiovascular: S1 S2 normal no gallop . Respiratory: No rales or rhonchi noted . Abdomen: soft . Skin: no rash seen on limited exam . Musculoskeletal: not rigid . Psychiatric:unable to assess . Neurologic: no seizure no involuntary movements         Lab Data:   Basic Metabolic Panel: Recent Labs  Lab 01/27/19 0644 01/30/19 0707 02/01/19 0516  NA 144 143 140  K 4.0 4.2 4.5  CL 104 101 96*  CO2 30 29 28   GLUCOSE 251* 252* 283*  BUN 34* 54* 70*  CREATININE 0.81 0.94 0.88  CALCIUM 9.6 10.1 10.1  MG 2.0 2.1 2.3  PHOS  --  4.2  --     ABG: No results for input(s): PHART, PCO2ART, PO2ART, HCO3, O2SAT in the last 168 hours.  Liver Function Tests: No results for input(s): AST, ALT, ALKPHOS, BILITOT, PROT, ALBUMIN in the last 168 hours. No results for input(s): LIPASE, AMYLASE in the last 168 hours. No results for input(s): AMMONIA in the last 168 hours.  CBC: Recent Labs  Lab 01/27/19 0644 01/30/19 0707  WBC 11.1* 11.6*  HGB 12.7 12.6  HCT 43.6 42.5  MCV 94.6 93.2  PLT 374 396    Cardiac Enzymes: No results for input(s): CKTOTAL, CKMB, CKMBINDEX, TROPONINI in the last 168 hours.  BNP (last 3 results) No results for input(s): BNP in the  last 8760 hours.  ProBNP (last 3 results) No results for input(s): PROBNP in the last 8760 hours.  Radiological Exams: No results found.  Assessment/Plan Active Problems:   Acute on chronic respiratory failure with hypoxia (HCC)   Hemothorax, traumatic, subsequent encounter   Bilateral pulmonary contusion   Acute renal failure due to tubular necrosis (HCC)   Closed nondisplaced fracture of body of right calcaneus   Diabetic polyneuropathy associated with type 2 diabetes mellitus (HCC)   Severe protein-calorie malnutrition (HCC)   Pressure injury of right heel, stage 1   1. Acute on chronic respiratory failure hypoxia continue use aerosol trach collar 28% at this time.. Continue supportive measures and pulmonary toilet. 2. Hemothorax stable we will continue to monitor radiologically as necessary 3. Bilateral pulmonary contusions clinically improving 4. Acute renal failure labs are being followed    I have personally seen and evaluated the patient, evaluated laboratory and imaging results, formulated the assessment and plan and placed orders. The Patient requires high complexity decision making for assessment and support.  Case was discussed on Rounds with the Respiratory Therapy Staff  Allyne Gee, MD [facility] Pulmonary Critical Care Medicine Sleep Medicine

## 2019-02-03 LAB — BASIC METABOLIC PANEL
Anion gap: 16 — ABNORMAL HIGH (ref 5–15)
BUN: 83 mg/dL — ABNORMAL HIGH (ref 8–23)
CO2: 27 mmol/L (ref 22–32)
Calcium: 10.6 mg/dL — ABNORMAL HIGH (ref 8.9–10.3)
Chloride: 96 mmol/L — ABNORMAL LOW (ref 98–111)
Creatinine, Ser: 1.19 mg/dL — ABNORMAL HIGH (ref 0.44–1.00)
GFR calc Af Amer: 50 mL/min — ABNORMAL LOW (ref >60)
GFR calc non Af Amer: 43 mL/min — ABNORMAL LOW (ref >60)
Glucose, Bld: 206 mg/dL — ABNORMAL HIGH (ref 70–99)
Potassium: 4.5 mmol/L (ref 3.5–5.1)
Sodium: 139 mmol/L (ref 135–145)

## 2019-02-03 LAB — CBC
HCT: 46.2 % — ABNORMAL HIGH (ref 36.0–46.0)
Hemoglobin: 14.3 g/dL (ref 12.0–15.0)
MCH: 27.9 pg (ref 26.0–34.0)
MCHC: 31 g/dL (ref 30.0–36.0)
MCV: 90.2 fL (ref 80.0–100.0)
Platelets: 339 10*3/uL (ref 150–400)
RBC: 5.12 MIL/uL — ABNORMAL HIGH (ref 3.87–5.11)
RDW: 14.3 % (ref 11.5–15.5)
WBC: 12 10*3/uL — ABNORMAL HIGH (ref 4.0–10.5)
nRBC: 0 % (ref 0.0–0.2)

## 2019-02-03 LAB — MAGNESIUM: Magnesium: 2.6 mg/dL — ABNORMAL HIGH (ref 1.7–2.4)

## 2019-02-03 LAB — PHOSPHORUS: Phosphorus: 4.8 mg/dL — ABNORMAL HIGH (ref 2.5–4.6)

## 2019-02-03 NOTE — Progress Notes (Addendum)
Pulmonary Critical Care Medicine [place]   PULMONARY CRITICAL CARE SERVICE  PROGRESS NOTE  Date of Service: 02/03/2019  Heather Lyons  PIR:[id number]  DOB: 18-Dec-1934   DOA: 01/19/2019  Referring Physician: Merton Border, MD  HPI: Heather Lyons is a 84 y.o. female seen for follow up of Acute on Chronic Respiratory Failure.  Patient continues on a trach collar 28% FiO2 using PMV with no difficulty.  Medications: Reviewed on Rounds  Physical Exam:  Vitals: Pulse 86 respirations 18 BP 130/78 O2 sat 97% temp 98.0  Ventilator Settings ATC 28%  . General: Comfortable at this time . Eyes: Grossly normal lids, irises & conjunctiva . ENT: grossly tongue is normal . Neck: no obvious mass . Cardiovascular: S1 S2 normal no gallop . Respiratory: No rales or rhonchi noted . Abdomen: soft . Skin: no rash seen on limited exam . Musculoskeletal: not rigid . Psychiatric:unable to assess . Neurologic: no seizure no involuntary movements         Lab Data:   Basic Metabolic Panel: Recent Labs  Lab 01/30/19 0707 02/01/19 0516 02/03/19 0550  NA 143 140 139  K 4.2 4.5 4.5  CL 101 96* 96*  CO2 29 28 27   GLUCOSE 252* 283* 206*  BUN 54* 70* 83*  CREATININE 0.94 0.88 1.19*  CALCIUM 10.1 10.1 10.6*  MG 2.1 2.3 2.6*  PHOS 4.2  --  4.8*    ABG: No results for input(s): PHART, PCO2ART, PO2ART, HCO3, O2SAT in the last 168 hours.  Liver Function Tests: No results for input(s): AST, ALT, ALKPHOS, BILITOT, PROT, ALBUMIN in the last 168 hours. No results for input(s): LIPASE, AMYLASE in the last 168 hours. No results for input(s): AMMONIA in the last 168 hours.  CBC: Recent Labs  Lab 01/30/19 0707 02/03/19 0550  WBC 11.6* 12.0*  HGB 12.6 14.3  HCT 42.5 46.2*  MCV 93.2 90.2  PLT 396 339    Cardiac Enzymes: No results for input(s): CKTOTAL, CKMB, CKMBINDEX, TROPONINI in the last 168 hours.  BNP (last 3 results) No results for input(s): BNP in the last 8760  hours.  ProBNP (last 3 results) No results for input(s): PROBNP in the last 8760 hours.  Radiological Exams: No results found.  Assessment/Plan Active Problems:   Acute on chronic respiratory failure with hypoxia (HCC)   Hemothorax, traumatic, subsequent encounter   Bilateral pulmonary contusion   Acute renal failure due to tubular necrosis (HCC)   Closed nondisplaced fracture of body of right calcaneus   Diabetic polyneuropathy associated with type 2 diabetes mellitus (HCC)   Severe protein-calorie malnutrition (HCC)   Pressure injury of right heel, stage 1   1. Acute on chronic respiratory failure hypoxiacontinue use aerosol trach collar 28% at this time.. Continue supportive measures and pulmonary toilet. 2. Hemothorax stable we will continue to monitor radiologically as necessary 3. Bilateral pulmonary contusions clinically improving 4. Acute renal failure labs are being followed    I have personally seen and evaluated the patient, evaluated laboratory and imaging results, formulated the assessment and plan and placed orders. The Patient requires high complexity decision making for assessment and support.  Case was discussed on Rounds with the Respiratory Therapy Staff  Allyne Gee, MD [facility] Pulmonary Critical Care Medicine Sleep Medicine

## 2019-02-04 NOTE — Progress Notes (Addendum)
Pulmonary Critical Care Medicine [place]   PULMONARY CRITICAL CARE SERVICE  PROGRESS NOTE  Date of Service: 02/04/2019  Heather Lyons  [id number]  DOB: Feb 27, 1935   DOA: 01/19/2019  Referring Physician: Merton Border, MD  HPI: Heather Lyons is a 84 y.o. female seen for follow up of Acute on Chronic Respiratory Failure.  Patient continues on aerosol collar 28% FiO2 satting well using PMV with no difficulty.  Medications: Reviewed on Rounds  Physical Exam:  Vitals: Pulse 87 respirations 15 BP 102/61 O2 sat 98% temp 95.7  Ventilator Settings ATC 28%  . General: Comfortable at this time . Eyes: Grossly normal lids, irises & conjunctiva . ENT: grossly tongue is normal . Neck: no obvious mass . Cardiovascular: S1 S2 normal no gallop . Respiratory: No rales or rhonchi noted . Abdomen: soft . Skin: no rash seen on limited exam . Musculoskeletal: not rigid . Psychiatric:unable to assess . Neurologic: no seizure no involuntary movements         Lab Data:   Basic Metabolic Panel: Recent Labs  Lab 01/30/19 0707 02/01/19 0516 02/03/19 0550  NA 143 140 139  K 4.2 4.5 4.5  CL 101 96* 96*  CO2 29 28 27   GLUCOSE 252* 283* 206*  BUN 54* 70* 83*  CREATININE 0.94 0.88 1.19*  CALCIUM 10.1 10.1 10.6*  MG 2.1 2.3 2.6*  PHOS 4.2  --  4.8*    ABG: No results for input(s): PHART, PCO2ART, PO2ART, HCO3, O2SAT in the last 168 hours.  Liver Function Tests: No results for input(s): AST, ALT, ALKPHOS, BILITOT, PROT, ALBUMIN in the last 168 hours. No results for input(s): LIPASE, AMYLASE in the last 168 hours. No results for input(s): AMMONIA in the last 168 hours.  CBC: Recent Labs  Lab 01/30/19 0707 02/03/19 0550  WBC 11.6* 12.0*  HGB 12.6 14.3  HCT 42.5 46.2*  MCV 93.2 90.2  PLT 396 339    Cardiac Enzymes: No results for input(s): CKTOTAL, CKMB, CKMBINDEX, TROPONINI in the last 168 hours.  BNP (last 3 results) No results for input(s): BNP in the  last 8760 hours.  ProBNP (last 3 results) No results for input(s): PROBNP in the last 8760 hours.  Radiological Exams: No results found.  Assessment/Plan Active Problems:   Acute on chronic respiratory failure with hypoxia (HCC)   Hemothorax, traumatic, subsequent encounter   Bilateral pulmonary contusion   Acute renal failure due to tubular necrosis (HCC)   Closed nondisplaced fracture of body of right calcaneus   Diabetic polyneuropathy associated with type 2 diabetes mellitus (HCC)   Severe protein-calorie malnutrition (HCC)   Pressure injury of right heel, stage 1   1. Acute on chronic respiratory failure hypoxiacontinue use aerosol trach collar 28% at this time.. Continue supportive measures and pulmonary toilet. 2. Hemothorax stable we will continue to monitor radiologically as necessary 3. Bilateral pulmonary contusions clinically improving 4. Acute renal failure labs are being followed   I have personally seen and evaluated the patient, evaluated laboratory and imaging results, formulated the assessment and plan and placed orders. The Patient requires high complexity decision making for assessment and support.  Case was discussed on Rounds with the Respiratory Therapy Staff  Allyne Gee, MD [facility] Pulmonary Critical Care Medicine Sleep Medicine

## 2019-02-05 LAB — COMPREHENSIVE METABOLIC PANEL
ALT: 48 U/L — ABNORMAL HIGH (ref 0–44)
AST: 56 U/L — ABNORMAL HIGH (ref 15–41)
Albumin: 3.1 g/dL — ABNORMAL LOW (ref 3.5–5.0)
Alkaline Phosphatase: 204 U/L — ABNORMAL HIGH (ref 38–126)
Anion gap: 16 — ABNORMAL HIGH (ref 5–15)
BUN: 85 mg/dL — ABNORMAL HIGH (ref 8–23)
CO2: 26 mmol/L (ref 22–32)
Calcium: 10 mg/dL (ref 8.9–10.3)
Chloride: 96 mmol/L — ABNORMAL LOW (ref 98–111)
Creatinine, Ser: 1.19 mg/dL — ABNORMAL HIGH (ref 0.44–1.00)
GFR calc Af Amer: 50 mL/min — ABNORMAL LOW (ref >60)
GFR calc non Af Amer: 43 mL/min — ABNORMAL LOW (ref >60)
Glucose, Bld: 137 mg/dL — ABNORMAL HIGH (ref 70–99)
Potassium: 4.5 mmol/L (ref 3.5–5.1)
Sodium: 138 mmol/L (ref 135–145)
Total Bilirubin: 0.9 mg/dL (ref 0.3–1.2)
Total Protein: 8.6 g/dL — ABNORMAL HIGH (ref 6.5–8.1)

## 2019-02-05 LAB — CBC
HCT: 44.2 % (ref 36.0–46.0)
Hemoglobin: 13.6 g/dL (ref 12.0–15.0)
MCH: 27.9 pg (ref 26.0–34.0)
MCHC: 30.8 g/dL (ref 30.0–36.0)
MCV: 90.8 fL (ref 80.0–100.0)
Platelets: 290 10*3/uL (ref 150–400)
RBC: 4.87 MIL/uL (ref 3.87–5.11)
RDW: 14.6 % (ref 11.5–15.5)
WBC: 10.5 10*3/uL (ref 4.0–10.5)
nRBC: 0 % (ref 0.0–0.2)

## 2019-02-05 LAB — MAGNESIUM: Magnesium: 2.5 mg/dL — ABNORMAL HIGH (ref 1.7–2.4)

## 2019-02-05 LAB — PHOSPHORUS: Phosphorus: 4.8 mg/dL — ABNORMAL HIGH (ref 2.5–4.6)

## 2019-02-05 NOTE — Progress Notes (Addendum)
Pulmonary Critical Care Medicine [place]   PULMONARY CRITICAL CARE SERVICE  PROGRESS NOTE  Date of Service: 02/05/2019  Heather Lyons  NAT:[id number]  DOB: May 31, 1935   DOA: 01/19/2019  Referring Physician: Merton Border, MD  HPI: Heather Lyons is a 84 y.o. female seen for follow up of Acute on Chronic Respiratory Failure.  Patient remains on a trach collar 28% FiO2 satting well with no distress.  Medications: Reviewed on Rounds  Physical Exam:  Vitals: Pulse 82 respirations 22 BP 124/66 pulse ox 97% temp 96.3  Ventilator Settings ATC 28%  . General: Comfortable at this time . Eyes: Grossly normal lids, irises & conjunctiva . ENT: grossly tongue is normal . Neck: no obvious mass . Cardiovascular: S1 S2 normal no gallop . Respiratory: No rales or rhonchi noted . Abdomen: soft . Skin: no rash seen on limited exam . Musculoskeletal: not rigid . Psychiatric:unable to assess . Neurologic: no seizure no involuntary movements         Lab Data:   Basic Metabolic Panel: Recent Labs  Lab 01/30/19 0707 02/01/19 0516 02/03/19 0550 02/05/19 0657  NA 143 140 139 138  K 4.2 4.5 4.5 4.5  CL 101 96* 96* 96*  CO2 29 28 27 26   GLUCOSE 252* 283* 206* 137*  BUN 54* 70* 83* 85*  CREATININE 0.94 0.88 1.19* 1.19*  CALCIUM 10.1 10.1 10.6* 10.0  MG 2.1 2.3 2.6* 2.5*  PHOS 4.2  --  4.8* 4.8*    ABG: No results for input(s): PHART, PCO2ART, PO2ART, HCO3, O2SAT in the last 168 hours.  Liver Function Tests: Recent Labs  Lab 02/05/19 0657  AST 56*  ALT 48*  ALKPHOS 204*  BILITOT 0.9  PROT 8.6*  ALBUMIN 3.1*   No results for input(s): LIPASE, AMYLASE in the last 168 hours. No results for input(s): AMMONIA in the last 168 hours.  CBC: Recent Labs  Lab 01/30/19 0707 02/03/19 0550 02/05/19 0657  WBC 11.6* 12.0* 10.5  HGB 12.6 14.3 13.6  HCT 42.5 46.2* 44.2  MCV 93.2 90.2 90.8  PLT 396 339 290    Cardiac Enzymes: No results for input(s): CKTOTAL,  CKMB, CKMBINDEX, TROPONINI in the last 168 hours.  BNP (last 3 results) No results for input(s): BNP in the last 8760 hours.  ProBNP (last 3 results) No results for input(s): PROBNP in the last 8760 hours.  Radiological Exams: No results found.  Assessment/Plan Active Problems:   Acute on chronic respiratory failure with hypoxia (HCC)   Hemothorax, traumatic, subsequent encounter   Bilateral pulmonary contusion   Acute renal failure due to tubular necrosis (HCC)   Closed nondisplaced fracture of body of right calcaneus   Diabetic polyneuropathy associated with type 2 diabetes mellitus (HCC)   Severe protein-calorie malnutrition (HCC)   Pressure injury of right heel, stage 1   1. Acute on chronic respiratory failure hypoxiacontinue use aerosol trach collar 28% at this time.. Continue supportive measures and pulmonary toilet. 2. Hemothorax stable we will continue to monitor radiologically as necessary 3. Bilateral pulmonary contusions clinically improving 4. Acute renal failure labs are being followed   I have personally seen and evaluated the patient, evaluated laboratory and imaging results, formulated the assessment and plan and placed orders. The Patient requires high complexity decision making for assessment and support.  Case was discussed on Rounds with the Respiratory Therapy Staff  Allyne Gee, MD [facility] Pulmonary Critical Care Medicine Sleep Medicine

## 2019-02-06 ENCOUNTER — Other Ambulatory Visit (HOSPITAL_COMMUNITY): Payer: Self-pay

## 2019-02-06 NOTE — Progress Notes (Signed)
Pulmonary Critical Care Medicine [place]   PULMONARY CRITICAL CARE SERVICE  PROGRESS NOTE  Date of Service: 02/06/2019  Heather Lyons  XBM:[id number]  DOB: 11-Apr-1935   DOA: 01/19/2019  Referring Physician: Merton Border, MD  HPI: Heather Lyons is a 84 y.o. female seen for follow up of Acute on Chronic Respiratory Failure.  Patient currently is on T collar good saturations are noted  I have done a swallowing test done which the patient was able to pass  Medications: Reviewed on Rounds  Physical Exam:  Vitals: Temperature 95.9 pulse 75 respiratory rate 12 blood pressure 116/56 saturations 97%  Ventilator Settings on T collar currently with the PMV in place  . General: Comfortable at this time . Eyes: Grossly normal lids, irises & conjunctiva . ENT: grossly tongue is normal . Neck: no obvious mass . Cardiovascular: S1 S2 normal no gallop . Respiratory: No rhonchi no rales are noted at this time . Abdomen: soft . Skin: no rash seen on limited exam . Musculoskeletal: not rigid . Psychiatric:unable to assess . Neurologic: no seizure no involuntary movements         Lab Data:   Basic Metabolic Panel: Recent Labs  Lab 02/01/19 0516 02/03/19 0550 02/05/19 0657  NA 140 139 138  K 4.5 4.5 4.5  CL 96* 96* 96*  CO2 28 27 26   GLUCOSE 283* 206* 137*  BUN 70* 83* 85*  CREATININE 0.88 1.19* 1.19*  CALCIUM 10.1 10.6* 10.0  MG 2.3 2.6* 2.5*  PHOS  --  4.8* 4.8*    ABG: No results for input(s): PHART, PCO2ART, PO2ART, HCO3, O2SAT in the last 168 hours.  Liver Function Tests: Recent Labs  Lab 02/05/19 0657  AST 56*  ALT 48*  ALKPHOS 204*  BILITOT 0.9  PROT 8.6*  ALBUMIN 3.1*   No results for input(s): LIPASE, AMYLASE in the last 168 hours. No results for input(s): AMMONIA in the last 168 hours.  CBC: Recent Labs  Lab 02/03/19 0550 02/05/19 0657  WBC 12.0* 10.5  HGB 14.3 13.6  HCT 46.2* 44.2  MCV 90.2 90.8  PLT 339 290    Cardiac  Enzymes: No results for input(s): CKTOTAL, CKMB, CKMBINDEX, TROPONINI in the last 168 hours.  BNP (last 3 results) No results for input(s): BNP in the last 8760 hours.  ProBNP (last 3 results) No results for input(s): PROBNP in the last 8760 hours.  Radiological Exams: No results found.  Assessment/Plan Active Problems:   Acute on chronic respiratory failure with hypoxia (HCC)   Hemothorax, traumatic, subsequent encounter   Bilateral pulmonary contusion   Acute renal failure due to tubular necrosis (HCC)   1. Acute on chronic respiratory failure with hypoxia we will continue with weaning on T collar trials with the PMV and begin capping trials now 2. Traumatic hemothorax subsequent encounter unchanged we will continue with supportive care 3. Pulmonary contusion at baseline 4. Renal failure we will continue to follow-up on labs improving   I have personally seen and evaluated the patient, evaluated laboratory and imaging results, formulated the assessment and plan and placed orders. The Patient requires high complexity decision making for assessment and support.  Case was discussed on Rounds with the Respiratory Therapy Staff  Allyne Gee, MD [facility] Pulmonary Critical Care Medicine Sleep Medicine

## 2019-02-07 LAB — BASIC METABOLIC PANEL
Anion gap: 16 — ABNORMAL HIGH (ref 5–15)
BUN: 84 mg/dL — ABNORMAL HIGH (ref 8–23)
CO2: 25 mmol/L (ref 22–32)
Calcium: 10.4 mg/dL — ABNORMAL HIGH (ref 8.9–10.3)
Chloride: 94 mmol/L — ABNORMAL LOW (ref 98–111)
Creatinine, Ser: 1.37 mg/dL — ABNORMAL HIGH (ref 0.44–1.00)
GFR calc Af Amer: 42 mL/min — ABNORMAL LOW (ref >60)
GFR calc non Af Amer: 36 mL/min — ABNORMAL LOW (ref >60)
Glucose, Bld: 150 mg/dL — ABNORMAL HIGH (ref 70–99)
Potassium: 4.7 mmol/L (ref 3.5–5.1)
Sodium: 135 mmol/L (ref 135–145)

## 2019-02-07 NOTE — Progress Notes (Signed)
Pulmonary Critical Care Medicine [place]   PULMONARY CRITICAL CARE SERVICE  PROGRESS NOTE  Date of Service: 02/07/2019  Jayme Mednick  TKW:[id number]  DOB: 03/13/1935   DOA: 01/19/2019  Referring Physician: Merton Border, MD  HPI: Sareen Randon is a 84 y.o. female seen for follow up of Acute on Chronic Respiratory Failure.  Patient is capping doing fairly well has been on room air  Medications: Reviewed on Rounds  Physical Exam:  Vitals: Temperature 97.8 pulse 80 respiratory rate 18 blood pressure 116/60 saturations 97%  Ventilator Settings capping off the ventilator  . General: Comfortable at this time . Eyes: Grossly normal lids, irises & conjunctiva . ENT: grossly tongue is normal . Neck: no obvious mass . Cardiovascular: S1 S2 normal no gallop . Respiratory: No rhonchi no rales are noted . Abdomen: soft . Skin: no rash seen on limited exam . Musculoskeletal: not rigid . Psychiatric:unable to assess . Neurologic: no seizure no involuntary movements         Lab Data:   Basic Metabolic Panel: Recent Labs  Lab 02/01/19 0516 02/03/19 0550 02/05/19 0657 02/07/19 0551  NA 140 139 138 135  K 4.5 4.5 4.5 4.7  CL 96* 96* 96* 94*  CO2 28 27 26 25   GLUCOSE 283* 206* 137* 150*  BUN 70* 83* 85* 84*  CREATININE 0.88 1.19* 1.19* 1.37*  CALCIUM 10.1 10.6* 10.0 10.4*  MG 2.3 2.6* 2.5*  --   PHOS  --  4.8* 4.8*  --     ABG: No results for input(s): PHART, PCO2ART, PO2ART, HCO3, O2SAT in the last 168 hours.  Liver Function Tests: Recent Labs  Lab 02/05/19 0657  AST 56*  ALT 48*  ALKPHOS 204*  BILITOT 0.9  PROT 8.6*  ALBUMIN 3.1*   No results for input(s): LIPASE, AMYLASE in the last 168 hours. No results for input(s): AMMONIA in the last 168 hours.  CBC: Recent Labs  Lab 02/03/19 0550 02/05/19 0657  WBC 12.0* 10.5  HGB 14.3 13.6  HCT 46.2* 44.2  MCV 90.2 90.8  PLT 339 290    Cardiac Enzymes: No results for input(s): CKTOTAL,  CKMB, CKMBINDEX, TROPONINI in the last 168 hours.  BNP (last 3 results) No results for input(s): BNP in the last 8760 hours.  ProBNP (last 3 results) No results for input(s): PROBNP in the last 8760 hours.  Radiological Exams: No results found.  Assessment/Plan Active Problems:   Acute on chronic respiratory failure with hypoxia (HCC)   Hemothorax, traumatic, subsequent encounter   Bilateral pulmonary contusion   Acute renal failure due to tubular necrosis (HCC)   1. Acute on chronic respiratory failure with hypoxia we will continue with capping trials as ordered titrate oxygen continue pulmonary toilet 2. Hemothorax improving we will continue to monitor 3. Bilateral pulmonary contusions slowly improving continue with supportive care 4. Acute renal failure due to tubular necrosis at baseline we will continue with supportive care   I have personally seen and evaluated the patient, evaluated laboratory and imaging results, formulated the assessment and plan and placed orders. The Patient requires high complexity decision making for assessment and support.  Case was discussed on Rounds with the Respiratory Therapy Staff  Allyne Gee, MD [facility] Pulmonary Critical Care Medicine Sleep Medicine

## 2019-02-08 ENCOUNTER — Institutional Professional Consult (permissible substitution) (HOSPITAL_COMMUNITY): Payer: Self-pay

## 2019-02-08 LAB — CBC
HCT: 43.2 % (ref 36.0–46.0)
Hemoglobin: 13.6 g/dL (ref 12.0–15.0)
MCH: 27.5 pg (ref 26.0–34.0)
MCHC: 31.5 g/dL (ref 30.0–36.0)
MCV: 87.4 fL (ref 80.0–100.0)
Platelets: 252 10*3/uL (ref 150–400)
RBC: 4.94 MIL/uL (ref 3.87–5.11)
RDW: 14.3 % (ref 11.5–15.5)
WBC: 7.6 10*3/uL (ref 4.0–10.5)
nRBC: 0 % (ref 0.0–0.2)

## 2019-02-08 LAB — MAGNESIUM: Magnesium: 2.4 mg/dL (ref 1.7–2.4)

## 2019-02-08 LAB — BASIC METABOLIC PANEL
Anion gap: 13 (ref 5–15)
Anion gap: 13 (ref 5–15)
BUN: 88 mg/dL — ABNORMAL HIGH (ref 8–23)
BUN: 84 mg/dL — ABNORMAL HIGH (ref 8–23)
CO2: 24 mmol/L (ref 22–32)
CO2: 22 mmol/L (ref 22–32)
Calcium: 9.7 mg/dL (ref 8.9–10.3)
Calcium: 9.7 mg/dL (ref 8.9–10.3)
Chloride: 92 mmol/L — ABNORMAL LOW (ref 98–111)
Chloride: 92 mmol/L — ABNORMAL LOW (ref 98–111)
Creatinine, Ser: 1.46 mg/dL — ABNORMAL HIGH (ref 0.44–1.00)
Creatinine, Ser: 1.31 mg/dL — ABNORMAL HIGH (ref 0.44–1.00)
GFR calc Af Amer: 39 mL/min — ABNORMAL LOW (ref >60)
GFR calc Af Amer: 44 mL/min — ABNORMAL LOW (ref >60)
GFR calc non Af Amer: 33 mL/min — ABNORMAL LOW (ref >60)
GFR calc non Af Amer: 38 mL/min — ABNORMAL LOW (ref >60)
Glucose, Bld: 194 mg/dL — ABNORMAL HIGH (ref 70–99)
Glucose, Bld: 213 mg/dL — ABNORMAL HIGH (ref 70–99)
Potassium: 4.7 mmol/L (ref 3.5–5.1)
Potassium: 5.2 mmol/L — ABNORMAL HIGH (ref 3.5–5.1)
Sodium: 129 mmol/L — ABNORMAL LOW (ref 135–145)
Sodium: 127 mmol/L — ABNORMAL LOW (ref 135–145)

## 2019-02-08 NOTE — Progress Notes (Signed)
Pulmonary Critical Care Medicine [place]   PULMONARY CRITICAL CARE SERVICE  PROGRESS NOTE  Date of Service: 02/08/2019  Heather Lyons  XTG:[id number]  DOB: 04-16-1935   DOA: 01/19/2019  Referring Physician: Merton Border, MD  HPI: Heather Lyons is a 84 y.o. female seen for follow up of Acute on Chronic Respiratory Failure.  Patient is capping right now is doing well has been capping for about 24 hours  Medications: Reviewed on Rounds  Physical Exam:  Vitals: Temperature 97.5 pulse 74 respiratory 18 blood pressure 99/57  Ventilator Settings capping off the ventilator  . General: Comfortable at this time . Eyes: Grossly normal lids, irises & conjunctiva . ENT: grossly tongue is normal . Neck: no obvious mass . Cardiovascular: S1 S2 normal no gallop . Respiratory: No rhonchi no rales are noted at this time . Abdomen: soft . Skin: no rash seen on limited exam . Musculoskeletal: not rigid . Psychiatric:unable to assess . Neurologic: no seizure no involuntary movements         Lab Data:   Basic Metabolic Panel: Recent Labs  Lab 02/03/19 0550 02/05/19 0657 02/07/19 0551 02/08/19 0609  NA 139 138 135 129*  K 4.5 4.5 4.7 4.7  CL 96* 96* 94* 92*  CO2 27 26 25 24   GLUCOSE 206* 137* 150* 194*  BUN 83* 85* 84* 88*  CREATININE 1.19* 1.19* 1.37* 1.46*  CALCIUM 10.6* 10.0 10.4* 9.7  MG 2.6* 2.5*  --  2.4  PHOS 4.8* 4.8*  --   --     ABG: No results for input(s): PHART, PCO2ART, PO2ART, HCO3, O2SAT in the last 168 hours.  Liver Function Tests: Recent Labs  Lab 02/05/19 0657  AST 56*  ALT 48*  ALKPHOS 204*  BILITOT 0.9  PROT 8.6*  ALBUMIN 3.1*   No results for input(s): LIPASE, AMYLASE in the last 168 hours. No results for input(s): AMMONIA in the last 168 hours.  CBC: Recent Labs  Lab 02/03/19 0550 02/05/19 0657 02/08/19 0609  WBC 12.0* 10.5 7.6  HGB 14.3 13.6 13.6  HCT 46.2* 44.2 43.2  MCV 90.2 90.8 87.4  PLT 339 290 252     Cardiac Enzymes: No results for input(s): CKTOTAL, CKMB, CKMBINDEX, TROPONINI in the last 168 hours.  BNP (last 3 results) No results for input(s): BNP in the last 8760 hours.  ProBNP (last 3 results) No results for input(s): PROBNP in the last 8760 hours.  Radiological Exams: No results found.  Assessment/Plan Active Problems:   Acute on chronic respiratory failure with hypoxia (HCC)   Hemothorax, traumatic, subsequent encounter   Bilateral pulmonary contusion   Acute renal failure due to tubular necrosis (HCC)   1. Acute on chronic respiratory failure hypoxia we will continue with capping trials titrate oxygen continue pulmonary toilet 2. Hemothorax resolved we will continue to monitor 3. Bilateral pulmonary contusions clinically improving 4. Acute renal failure with ATN at baseline continue with supportive care   I have personally seen and evaluated the patient, evaluated laboratory and imaging results, formulated the assessment and plan and placed orders. The Patient requires high complexity decision making for assessment and support.  Case was discussed on Rounds with the Respiratory Therapy Staff  Allyne Gee, MD [facility] Pulmonary Critical Care Medicine Sleep Medicine

## 2019-02-09 LAB — BASIC METABOLIC PANEL
Anion gap: 12 (ref 5–15)
BUN: 74 mg/dL — ABNORMAL HIGH (ref 8–23)
CO2: 22 mmol/L (ref 22–32)
Calcium: 9.4 mg/dL (ref 8.9–10.3)
Chloride: 97 mmol/L — ABNORMAL LOW (ref 98–111)
Creatinine, Ser: 1.11 mg/dL — ABNORMAL HIGH (ref 0.44–1.00)
GFR calc Af Amer: 54 mL/min — ABNORMAL LOW (ref >60)
GFR calc non Af Amer: 47 mL/min — ABNORMAL LOW (ref >60)
Glucose, Bld: 116 mg/dL — ABNORMAL HIGH (ref 70–99)
Potassium: 4.5 mmol/L (ref 3.5–5.1)
Sodium: 131 mmol/L — ABNORMAL LOW (ref 135–145)

## 2019-02-09 NOTE — Progress Notes (Addendum)
[facility]   PULMONARY CRITICAL CARE SERVICE  PROGRESS NOTE  Date of Service: 02/09/2019  Heather Lyons  IEP:[id number]  DOB: 11/17/1934   DOA: 01/19/2019  Referring Physician: Carron CurieAli Hijazi, MD  HPI: Heather Lyons is a 84 y.o. female seen for follow up of Acute on Chronic Respiratory Failure.  Patient mains capped on room air at this time has been 72 hours satting well with no distress.  Medications: Reviewed on Rounds  Physical Exam:  Vitals: Pulse 64 respirations 22 BP 123/71 O2 sat 99% temp 97.0  Ventilator Settings room air  . General: Comfortable at this time . Eyes: Grossly normal lids, irises & conjunctiva . ENT: grossly tongue is normal . Neck: no obvious mass . Cardiovascular: S1 S2 normal no gallop . Respiratory: No rales or rhonchi noted . Abdomen: soft . Skin: no rash seen on limited exam . Musculoskeletal: not rigid . Psychiatric:unable to assess . Neurologic: no seizure no involuntary movements         Lab Data:   Basic Metabolic Panel: Recent Labs  Lab 02/03/19 0550 02/05/19 0657 02/07/19 0551 02/08/19 0609 02/08/19 1806 02/09/19 0633  NA 139 138 135 129* 127* 131*  K 4.5 4.5 4.7 4.7 5.2* 4.5  CL 96* 96* 94* 92* 92* 97*  CO2 27 26 25 24 22 22   GLUCOSE 206* 137* 150* 194* 213* 116*  BUN 83* 85* 84* 88* 84* 74*  CREATININE 1.19* 1.19* 1.37* 1.46* 1.31* 1.11*  CALCIUM 10.6* 10.0 10.4* 9.7 9.7 9.4  MG 2.6* 2.5*  --  2.4  --   --   PHOS 4.8* 4.8*  --   --   --   --     ABG: No results for input(s): PHART, PCO2ART, PO2ART, HCO3, O2SAT in the last 168 hours.  Liver Function Tests: Recent Labs  Lab 02/05/19 0657  AST 56*  ALT 48*  ALKPHOS 204*  BILITOT 0.9  PROT 8.6*  ALBUMIN 3.1*   No results for input(s): LIPASE, AMYLASE in the last 168 hours. No results for input(s): AMMONIA in the last 168 hours.  CBC: Recent Labs  Lab 02/03/19 0550 02/05/19 0657 02/08/19 0609  WBC 12.0* 10.5  7.6  HGB 14.3 13.6 13.6  HCT 46.2* 44.2 43.2  MCV 90.2 90.8 87.4  PLT 339 290 252    Cardiac Enzymes: No results for input(s): CKTOTAL, CKMB, CKMBINDEX, TROPONINI in the last 168 hours.  BNP (last 3 results) No results for input(s): BNP in the last 8760 hours.  ProBNP (last 3 results) No results for input(s): PROBNP in the last 8760 hours.  Radiological Exams: [place] [facility]  Result Date: 02/08/2019 CLINICAL DATA:  Acute kidney injury. EXAM: RENAL / URINARY TRACT ULTRASOUND COMPLETE COMPARISON:  None. FINDINGS: Right Kidney: Renal measurements: 12.2 x 6.0 x 6.1 cm = volume: 232 mL. 1.1 cm simple cyst is noted in lower pole. Echogenicity within normal limits. No mass or hydronephrosis visualized. Left Kidney: Renal measurements: 11.0 x 6.0 x 5.0 cm = volume: 172 mL. Echogenicity within normal limits. No mass or hydronephrosis visualized. Bladder: Appears normal for degree of bladder distention. IMPRESSION: No significant renal abnormality is noted. Electronically Signed   By: Lupita RaiderJames  Green Jr M.D.   On: 02/08/2019 15:09    Assessment/Plan Active Problems:   Acute on chronic respiratory failure with hypoxia (HCC)   Hemothorax, traumatic, subsequent encounter   Bilateral pulmonary contusion   Acute renal failure due to tubular necrosis (HCC)  1. Acute on chronic respiratory failure hypoxia continue with capping trials.  Patient currently on room air if does well for 1 more day we will decannulate patient. 2. Hemothorax resolved we will continue to monitor 3. Bilateral pulmonary contusions clinically improving 4. Acute renal failure with ATN at baseline continue with supportive care   I have personally seen and evaluated the patient, evaluated laboratory and imaging results, formulated the assessment and plan and placed orders. The Patient requires high complexity decision making for assessment and support.  Case was discussed on Rounds with the Respiratory Therapy Staff  Allyne Gee,  MD [facility] Pulmonary Critical Care Medicine Sleep Medicine

## 2019-02-10 LAB — CBC
HCT: 38.4 % (ref 36.0–46.0)
Hemoglobin: 12.1 g/dL (ref 12.0–15.0)
MCH: 28.1 pg (ref 26.0–34.0)
MCHC: 31.5 g/dL (ref 30.0–36.0)
MCV: 89.1 fL (ref 80.0–100.0)
Platelets: 201 10*3/uL (ref 150–400)
RBC: 4.31 MIL/uL (ref 3.87–5.11)
RDW: 14.5 % (ref 11.5–15.5)
WBC: 6.9 10*3/uL (ref 4.0–10.5)
nRBC: 0 % (ref 0.0–0.2)

## 2019-02-10 LAB — RENAL FUNCTION PANEL
Albumin: 2.7 g/dL — ABNORMAL LOW (ref 3.5–5.0)
Anion gap: 10 (ref 5–15)
BUN: 44 mg/dL — ABNORMAL HIGH (ref 8–23)
CO2: 22 mmol/L (ref 22–32)
Calcium: 9.4 mg/dL (ref 8.9–10.3)
Chloride: 102 mmol/L (ref 98–111)
Creatinine, Ser: 0.85 mg/dL (ref 0.44–1.00)
GFR calc Af Amer: >60 mL/min (ref >60)
GFR calc non Af Amer: >60 mL/min (ref >60)
Glucose, Bld: 79 mg/dL (ref 70–99)
Phosphorus: 3.2 mg/dL (ref 2.5–4.6)
Potassium: 4.2 mmol/L (ref 3.5–5.1)
Sodium: 134 mmol/L — ABNORMAL LOW (ref 135–145)

## 2019-02-10 LAB — MAGNESIUM: Magnesium: 2 mg/dL (ref 1.7–2.4)

## 2019-02-10 NOTE — Progress Notes (Addendum)
Pulmonary Critical Care Medicine [place]   PULMONARY CRITICAL CARE SERVICE  PROGRESS NOTE  Date of Service: 02/10/2019  Heather Lyons  IFO:[id number]  DOB: [birth year]   DOA: 01/19/2019  Referring Physician: Merton Border, MD  HPI: Heather Lyons is a 84 y.o. female seen for follow up of Acute on Chronic Respiratory Failure.  Patient remains on room air doing well capped for more than 3 days.  Patient be decannulated today.  Medications: Reviewed on Rounds  Physical Exam:  Vitals: Pulse 68 respirations 16 BP 105/56 O2 sat 98% temp 97.5  Ventilator Settings capped on room air  . General: Comfortable at this time . Eyes: Grossly normal lids, irises & conjunctiva . ENT: grossly tongue is normal . Neck: no obvious mass . Cardiovascular: S1 S2 normal no gallop . Respiratory: No rales rhonchi noted . Abdomen: soft . Skin: no rash seen on limited exam . Musculoskeletal: not rigid . Psychiatric:unable to assess . Neurologic: no seizure no involuntary movements         Lab Data:   Basic Metabolic Panel: Recent Labs  Lab 02/05/19 0657 02/07/19 0551 02/08/19 0609 02/08/19 1806 02/09/19 0633 02/10/19 0612  NA 138 135 129* 127* 131* 134*  K 4.5 4.7 4.7 5.2* 4.5 4.2  CL 96* 94* 92* 92* 97* 102  CO2 26 25 24 22 22 22   GLUCOSE 137* 150* 194* 213* 116* 79  BUN 85* 84* 88* 84* 74* 44*  CREATININE 1.19* 1.37* 1.46* 1.31* 1.11* 0.85  CALCIUM 10.0 10.4* 9.7 9.7 9.4 9.4  MG 2.5*  --  2.4  --   --  2.0  PHOS 4.8*  --   --   --   --  3.2    ABG: No results for input(s): PHART, PCO2ART, PO2ART, HCO3, O2SAT in the last 168 hours.  Liver Function Tests: Recent Labs  Lab 02/05/19 0657 02/10/19 0612  AST 56*  --   ALT 48*  --   ALKPHOS 204*  --   BILITOT 0.9  --   PROT 8.6*  --   ALBUMIN 3.1* 2.7*   No results for input(s): LIPASE, AMYLASE in the last 168 hours. No results for input(s): AMMONIA in the last 168 hours.  CBC: Recent Labs  Lab  02/05/19 0657 02/08/19 0609 02/10/19 0612  WBC 10.5 7.6 6.9  HGB 13.6 13.6 12.1  HCT 44.2 43.2 38.4  MCV 90.8 87.4 89.1  PLT 290 252 201    Cardiac Enzymes: No results for input(s): CKTOTAL, CKMB, CKMBINDEX, TROPONINI in the last 168 hours.  BNP (last 3 results) No results for input(s): BNP in the last 8760 hours.  ProBNP (last 3 results) No results for input(s): PROBNP in the last 8760 hours.  Radiological Exams: No results found.  Assessment/Plan Active Problems:   Acute on chronic respiratory failure with hypoxia (HCC)   Hemothorax, traumatic, subsequent encounter   Bilateral pulmonary contusion   Acute renal failure due to tubular necrosis (HCC)   1. Acute on chronic respiratory failure hypoxia continue with capping trials.  Patient will be decannulated today. 2. Hemothorax resolved we will continue to monitor 3. Bilateral pulmonary contusions clinically improving 4. Acute renal failure with ATN at baseline continue with supportive care   I have personally seen and evaluated the patient, evaluated laboratory and imaging results, formulated the assessment and plan and placed orders. The Patient requires high complexity decision making for assessment and support.  Case was discussed on Rounds with the Respiratory Therapy Staff  Allyne Gee, MD [facility] Pulmonary Critical Care Medicine Sleep Medicine

## 2019-02-11 NOTE — Progress Notes (Addendum)
Pulmonary Critical Care Medicine [place]   PULMONARY CRITICAL CARE SERVICE  PROGRESS NOTE  Date of Service: 02/11/2019  Heather Lyons  FTD:[id number]  DOB: [birth year]   DOA: 01/19/2019  Referring Physician: Merton Border, MD  HPI: Heather Lyons is a 84 y.o. female seen for follow up of Acute on Chronic Respiratory Failure.  Patient was decannulated yesterday remains on room air satting well at this time.  Medications: Reviewed on Rounds  Physical Exam:  Vitals: Pulse 64 respirations 16 BP 129/69 O2 sat 98% temp 97.6  Ventilator Settings room air  . General: Comfortable at this time . Eyes: Grossly normal lids, irises & conjunctiva . ENT: grossly tongue is normal . Neck: no obvious mass . Cardiovascular: S1 S2 normal no gallop . Respiratory: No rales or rhonchi noted . Abdomen: soft . Skin: no rash seen on limited exam . Musculoskeletal: not rigid . Psychiatric:unable to assess . Neurologic: no seizure no involuntary movements         Lab Data:   Basic Metabolic Panel: Recent Labs  Lab 02/05/19 0657 02/07/19 0551 02/08/19 0609 02/08/19 1806 02/09/19 0633 02/10/19 0612  NA 138 135 129* 127* 131* 134*  K 4.5 4.7 4.7 5.2* 4.5 4.2  CL 96* 94* 92* 92* 97* 102  CO2 26 25 24 22 22 22   GLUCOSE 137* 150* 194* 213* 116* 79  BUN 85* 84* 88* 84* 74* 44*  CREATININE 1.19* 1.37* 1.46* 1.31* 1.11* 0.85  CALCIUM 10.0 10.4* 9.7 9.7 9.4 9.4  MG 2.5*  --  2.4  --   --  2.0  PHOS 4.8*  --   --   --   --  3.2    ABG: No results for input(s): PHART, PCO2ART, PO2ART, HCO3, O2SAT in the last 168 hours.  Liver Function Tests: Recent Labs  Lab 02/05/19 0657 02/10/19 0612  AST 56*  --   ALT 48*  --   ALKPHOS 204*  --   BILITOT 0.9  --   PROT 8.6*  --   ALBUMIN 3.1* 2.7*   No results for input(s): LIPASE, AMYLASE in the last 168 hours. No results for input(s): AMMONIA in the last 168 hours.  CBC: Recent Labs  Lab 02/05/19 0657 02/08/19 0609  02/10/19 0612  WBC 10.5 7.6 6.9  HGB 13.6 13.6 12.1  HCT 44.2 43.2 38.4  MCV 90.8 87.4 89.1  PLT 290 252 201    Cardiac Enzymes: No results for input(s): CKTOTAL, CKMB, CKMBINDEX, TROPONINI in the last 168 hours.  BNP (last 3 results) No results for input(s): BNP in the last 8760 hours.  ProBNP (last 3 results) No results for input(s): PROBNP in the last 8760 hours.  Radiological Exams: No results found.  Assessment/Plan Active Problems:   Acute on chronic respiratory failure with hypoxia (HCC)   Hemothorax, traumatic, subsequent encounter   Bilateral pulmonary contusion   Acute renal failure due to tubular necrosis (HCC)   1. Acute on chronic respiratory failure hypoxiapatient was decannulated yesterday doing well with no distress at this time.  Continue supportive measures. 2. Hemothorax resolved we will continue to monitor 3. Bilateral pulmonary contusions clinically improving 4. Acute renal failure with ATN at baseline continue with supportive care   I have personally seen and evaluated the patient, evaluated laboratory and imaging results, formulated the assessment and plan and placed orders. The Patient requires high complexity decision making for assessment and support.  Case was discussed on Rounds with the Respiratory Therapy Staff  Allyne Gee, MD [facility] Pulmonary Critical Care Medicine Sleep Medicine

## 2019-02-12 LAB — RENAL FUNCTION PANEL
Albumin: 2.6 g/dL — ABNORMAL LOW (ref 3.5–5.0)
Anion gap: 10 (ref 5–15)
BUN: 18 mg/dL (ref 8–23)
CO2: 20 mmol/L — ABNORMAL LOW (ref 22–32)
Calcium: 9.3 mg/dL (ref 8.9–10.3)
Chloride: 101 mmol/L (ref 98–111)
Creatinine, Ser: 0.73 mg/dL (ref 0.44–1.00)
GFR calc Af Amer: >60 mL/min (ref >60)
GFR calc non Af Amer: >60 mL/min (ref >60)
Glucose, Bld: 121 mg/dL — ABNORMAL HIGH (ref 70–99)
Phosphorus: 3 mg/dL (ref 2.5–4.6)
Potassium: 4.3 mmol/L (ref 3.5–5.1)
Sodium: 131 mmol/L — ABNORMAL LOW (ref 135–145)

## 2019-02-12 LAB — CBC
HCT: 38.2 % (ref 36.0–46.0)
Hemoglobin: 12.3 g/dL (ref 12.0–15.0)
MCH: 28.1 pg (ref 26.0–34.0)
MCHC: 32.2 g/dL (ref 30.0–36.0)
MCV: 87.2 fL (ref 80.0–100.0)
Platelets: 180 10*3/uL (ref 150–400)
RBC: 4.38 MIL/uL (ref 3.87–5.11)
RDW: 14.6 % (ref 11.5–15.5)
WBC: 6.9 10*3/uL (ref 4.0–10.5)
nRBC: 0 % (ref 0.0–0.2)

## 2019-02-12 LAB — MAGNESIUM: Magnesium: 1.6 mg/dL — ABNORMAL LOW (ref 1.7–2.4)

## 2019-02-12 NOTE — Progress Notes (Addendum)
Pulmonary Critical Care Medicine [place]   PULMONARY CRITICAL CARE SERVICE  PROGRESS NOTE  Date of Service: 02/12/2019  Heather Lyons  ZOX:[id number]  DOB: 02/23/1935   DOA: 01/19/2019  Referring Physician: Merton Border, MD  HPI: Heather Lyons is a 84 y.o. female seen for follow up of Acute on Chronic Respiratory Failure.  Patient is decannulated on room air has required 2 L of oxygen via nasal cannula at night due to desaturations.  Medications: Reviewed on Rounds  Physical Exam:  Vitals: Pulse 67 respirations 13 BP 113/64 O2 sat 97% temp 97.9  Ventilator Settings room air  . General: Comfortable at this time . Eyes: Grossly normal lids, irises & conjunctiva . ENT: grossly tongue is normal . Neck: no obvious mass . Cardiovascular: S1 S2 normal no gallop . Respiratory: No rales or rhonchi noted . Abdomen: soft . Skin: no rash seen on limited exam . Musculoskeletal: not rigid . Psychiatric:unable to assess . Neurologic: no seizure no involuntary movements         Lab Data:   Basic Metabolic Panel: Recent Labs  Lab 02/08/19 0609 02/08/19 1806 02/09/19 0633 02/10/19 0612 02/12/19 1129  NA 129* 127* 131* 134* 131*  K 4.7 5.2* 4.5 4.2 4.3  CL 92* 92* 97* 102 101  CO2 24 22 22 22  20*  GLUCOSE 194* 213* 116* 79 121*  BUN 88* 84* 74* 44* 18  CREATININE 1.46* 1.31* 1.11* 0.85 0.73  CALCIUM 9.7 9.7 9.4 9.4 9.3  MG 2.4  --   --  2.0 1.6*  PHOS  --   --   --  3.2 3.0    ABG: No results for input(s): PHART, PCO2ART, PO2ART, HCO3, O2SAT in the last 168 hours.  Liver Function Tests: Recent Labs  Lab 02/10/19 0612 02/12/19 1129  ALBUMIN 2.7* 2.6*   No results for input(s): LIPASE, AMYLASE in the last 168 hours. No results for input(s): AMMONIA in the last 168 hours.  CBC: Recent Labs  Lab 02/08/19 0609 02/10/19 0612 02/12/19 1129  WBC 7.6 6.9 6.9  HGB 13.6 12.1 12.3  HCT 43.2 38.4 38.2  MCV 87.4 89.1 87.2  PLT 252 201 180     Cardiac Enzymes: No results for input(s): CKTOTAL, CKMB, CKMBINDEX, TROPONINI in the last 168 hours.  BNP (last 3 results) No results for input(s): BNP in the last 8760 hours.  ProBNP (last 3 results) No results for input(s): PROBNP in the last 8760 hours.  Radiological Exams: No results found.  Assessment/Plan Active Problems:   Acute on chronic respiratory failure with hypoxia (HCC)   Hemothorax, traumatic, subsequent encounter   Bilateral pulmonary contusion   Acute renal failure due to tubular necrosis (HCC)   1. Acute on chronic respiratory failure hypoxiapatient was decannulated yesterday doing well with no distress at this time.  Continue supportive measures. 2. Hemothorax resolved we will continue to monitor 3. Bilateral pulmonary contusions clinically improving 4. Acute renal failure with ATN at baseline continue with supportive care   I have personally seen and evaluated the patient, evaluated laboratory and imaging results, formulated the assessment and plan and placed orders. The Patient requires high complexity decision making for assessment and support.  Case was discussed on Rounds with the Respiratory Therapy Staff  Allyne Gee, MD [facility] Pulmonary Critical Care Medicine Sleep Medicine

## 2019-02-13 ENCOUNTER — Other Ambulatory Visit (HOSPITAL_COMMUNITY): Payer: Self-pay

## 2019-02-13 LAB — BASIC METABOLIC PANEL
Anion gap: 10 (ref 5–15)
BUN: 13 mg/dL (ref 8–23)
CO2: 20 mmol/L — ABNORMAL LOW (ref 22–32)
Calcium: 9.5 mg/dL (ref 8.9–10.3)
Chloride: 104 mmol/L (ref 98–111)
Creatinine, Ser: 0.68 mg/dL (ref 0.44–1.00)
GFR calc Af Amer: >60 mL/min (ref >60)
GFR calc non Af Amer: >60 mL/min (ref >60)
Glucose, Bld: 100 mg/dL — ABNORMAL HIGH (ref 70–99)
Potassium: 4.3 mmol/L (ref 3.5–5.1)
Sodium: 134 mmol/L — ABNORMAL LOW (ref 135–145)

## 2019-02-13 LAB — MAGNESIUM: Magnesium: 1.7 mg/dL (ref 1.7–2.4)

## 2019-02-14 LAB — SARS CORONAVIRUS 2 BY RT PCR (HOSPITAL ORDER, PERFORMED IN ~~LOC~~ HOSPITAL LAB): SARS Coronavirus 2: NEGATIVE

## 2019-02-14 LAB — PHOSPHORUS: Phosphorus: 3.6 mg/dL (ref 2.5–4.6)

## 2019-02-14 LAB — CBC
HCT: 38.9 % (ref 36.0–46.0)
Hemoglobin: 12.4 g/dL (ref 12.0–15.0)
MCH: 28.1 pg (ref 26.0–34.0)
MCHC: 31.9 g/dL (ref 30.0–36.0)
MCV: 88.2 fL (ref 80.0–100.0)
Platelets: 175 10*3/uL (ref 150–400)
RBC: 4.41 MIL/uL (ref 3.87–5.11)
RDW: 14.8 % (ref 11.5–15.5)
WBC: 11 10*3/uL — ABNORMAL HIGH (ref 4.0–10.5)
nRBC: 0 % (ref 0.0–0.2)

## 2019-02-14 LAB — MAGNESIUM: Magnesium: 1.6 mg/dL — ABNORMAL LOW (ref 1.7–2.4)

## 2019-02-14 LAB — BASIC METABOLIC PANEL
Anion gap: 9 (ref 5–15)
BUN: 11 mg/dL (ref 8–23)
CO2: 20 mmol/L — ABNORMAL LOW (ref 22–32)
Calcium: 9.4 mg/dL (ref 8.9–10.3)
Chloride: 102 mmol/L (ref 98–111)
Creatinine, Ser: 0.66 mg/dL (ref 0.44–1.00)
GFR calc Af Amer: >60 mL/min (ref >60)
GFR calc non Af Amer: >60 mL/min (ref >60)
Glucose, Bld: 110 mg/dL — ABNORMAL HIGH (ref 70–99)
Potassium: 4.1 mmol/L (ref 3.5–5.1)
Sodium: 131 mmol/L — ABNORMAL LOW (ref 135–145)

## 2019-02-15 LAB — MAGNESIUM: Magnesium: 1.7 mg/dL (ref 1.7–2.4)

## 2019-02-15 LAB — BASIC METABOLIC PANEL
Anion gap: 10 (ref 5–15)
BUN: 13 mg/dL (ref 8–23)
CO2: 19 mmol/L — ABNORMAL LOW (ref 22–32)
Calcium: 9.5 mg/dL (ref 8.9–10.3)
Chloride: 103 mmol/L (ref 98–111)
Creatinine, Ser: 0.68 mg/dL (ref 0.44–1.00)
GFR calc Af Amer: >60 mL/min (ref >60)
GFR calc non Af Amer: >60 mL/min (ref >60)
Glucose, Bld: 120 mg/dL — ABNORMAL HIGH (ref 70–99)
Potassium: 4.3 mmol/L (ref 3.5–5.1)
Sodium: 132 mmol/L — ABNORMAL LOW (ref 135–145)

## 2019-02-22 ENCOUNTER — Encounter: Payer: Self-pay | Admitting: Internal Medicine

## 2019-03-06 ENCOUNTER — Ambulatory Visit: Payer: Medicare Other | Admitting: Orthopedic Surgery

## 2019-07-01 ENCOUNTER — Inpatient Hospital Stay (HOSPITAL_COMMUNITY): Payer: Medicare Other

## 2019-07-01 ENCOUNTER — Inpatient Hospital Stay (HOSPITAL_COMMUNITY)
Admission: AD | Admit: 2019-07-01 | Discharge: 2019-07-30 | DRG: 308 | Disposition: E | Payer: Medicare Other | Source: Other Acute Inpatient Hospital | Attending: Internal Medicine | Admitting: Internal Medicine

## 2019-07-01 DIAGNOSIS — E872 Acidosis: Secondary | ICD-10-CM | POA: Diagnosis present

## 2019-07-01 DIAGNOSIS — Z9049 Acquired absence of other specified parts of digestive tract: Secondary | ICD-10-CM

## 2019-07-01 DIAGNOSIS — Z90710 Acquired absence of both cervix and uterus: Secondary | ICD-10-CM | POA: Diagnosis not present

## 2019-07-01 DIAGNOSIS — I5032 Chronic diastolic (congestive) heart failure: Secondary | ICD-10-CM | POA: Diagnosis present

## 2019-07-01 DIAGNOSIS — D696 Thrombocytopenia, unspecified: Secondary | ICD-10-CM | POA: Diagnosis present

## 2019-07-01 DIAGNOSIS — Z807 Family history of other malignant neoplasms of lymphoid, hematopoietic and related tissues: Secondary | ICD-10-CM

## 2019-07-01 DIAGNOSIS — Z8249 Family history of ischemic heart disease and other diseases of the circulatory system: Secondary | ICD-10-CM

## 2019-07-01 DIAGNOSIS — N179 Acute kidney failure, unspecified: Secondary | ICD-10-CM | POA: Diagnosis present

## 2019-07-01 DIAGNOSIS — J9601 Acute respiratory failure with hypoxia: Secondary | ICD-10-CM | POA: Diagnosis present

## 2019-07-01 DIAGNOSIS — E785 Hyperlipidemia, unspecified: Secondary | ICD-10-CM | POA: Diagnosis present

## 2019-07-01 DIAGNOSIS — Z951 Presence of aortocoronary bypass graft: Secondary | ICD-10-CM

## 2019-07-01 DIAGNOSIS — G253 Myoclonus: Secondary | ICD-10-CM | POA: Diagnosis present

## 2019-07-01 DIAGNOSIS — I700 Atherosclerosis of aorta: Secondary | ICD-10-CM | POA: Diagnosis present

## 2019-07-01 DIAGNOSIS — Z978 Presence of other specified devices: Secondary | ICD-10-CM | POA: Diagnosis not present

## 2019-07-01 DIAGNOSIS — Z800 Family history of malignant neoplasm of digestive organs: Secondary | ICD-10-CM

## 2019-07-01 DIAGNOSIS — Z79899 Other long term (current) drug therapy: Secondary | ICD-10-CM

## 2019-07-01 DIAGNOSIS — I2510 Atherosclerotic heart disease of native coronary artery without angina pectoris: Secondary | ICD-10-CM | POA: Diagnosis present

## 2019-07-01 DIAGNOSIS — M1990 Unspecified osteoarthritis, unspecified site: Secondary | ICD-10-CM | POA: Diagnosis present

## 2019-07-01 DIAGNOSIS — Z95810 Presence of automatic (implantable) cardiac defibrillator: Secondary | ICD-10-CM

## 2019-07-01 DIAGNOSIS — I462 Cardiac arrest due to underlying cardiac condition: Secondary | ICD-10-CM | POA: Diagnosis present

## 2019-07-01 DIAGNOSIS — I959 Hypotension, unspecified: Secondary | ICD-10-CM | POA: Diagnosis present

## 2019-07-01 DIAGNOSIS — I472 Ventricular tachycardia, unspecified: Secondary | ICD-10-CM

## 2019-07-01 DIAGNOSIS — E1169 Type 2 diabetes mellitus with other specified complication: Secondary | ICD-10-CM | POA: Diagnosis present

## 2019-07-01 DIAGNOSIS — I469 Cardiac arrest, cause unspecified: Secondary | ICD-10-CM | POA: Diagnosis not present

## 2019-07-01 DIAGNOSIS — G931 Anoxic brain damage, not elsewhere classified: Secondary | ICD-10-CM | POA: Diagnosis not present

## 2019-07-01 DIAGNOSIS — R6521 Severe sepsis with septic shock: Secondary | ICD-10-CM | POA: Diagnosis not present

## 2019-07-01 DIAGNOSIS — Z933 Colostomy status: Secondary | ICD-10-CM

## 2019-07-01 DIAGNOSIS — E878 Other disorders of electrolyte and fluid balance, not elsewhere classified: Secondary | ICD-10-CM | POA: Diagnosis present

## 2019-07-01 DIAGNOSIS — N189 Chronic kidney disease, unspecified: Secondary | ICD-10-CM | POA: Diagnosis present

## 2019-07-01 DIAGNOSIS — R570 Cardiogenic shock: Secondary | ICD-10-CM

## 2019-07-01 DIAGNOSIS — I130 Hypertensive heart and chronic kidney disease with heart failure and stage 1 through stage 4 chronic kidney disease, or unspecified chronic kidney disease: Secondary | ICD-10-CM | POA: Diagnosis present

## 2019-07-01 DIAGNOSIS — Z7982 Long term (current) use of aspirin: Secondary | ICD-10-CM

## 2019-07-01 DIAGNOSIS — Z66 Do not resuscitate: Secondary | ICD-10-CM | POA: Diagnosis not present

## 2019-07-01 DIAGNOSIS — R7401 Elevation of levels of liver transaminase levels: Secondary | ICD-10-CM | POA: Diagnosis present

## 2019-07-01 DIAGNOSIS — Z8673 Personal history of transient ischemic attack (TIA), and cerebral infarction without residual deficits: Secondary | ICD-10-CM

## 2019-07-01 DIAGNOSIS — R569 Unspecified convulsions: Secondary | ICD-10-CM | POA: Diagnosis present

## 2019-07-01 DIAGNOSIS — G40901 Epilepsy, unspecified, not intractable, with status epilepticus: Secondary | ICD-10-CM | POA: Diagnosis not present

## 2019-07-01 DIAGNOSIS — G9340 Encephalopathy, unspecified: Secondary | ICD-10-CM

## 2019-07-01 DIAGNOSIS — Z515 Encounter for palliative care: Secondary | ICD-10-CM | POA: Diagnosis not present

## 2019-07-01 DIAGNOSIS — Z79891 Long term (current) use of opiate analgesic: Secondary | ICD-10-CM

## 2019-07-01 DIAGNOSIS — E1122 Type 2 diabetes mellitus with diabetic chronic kidney disease: Secondary | ICD-10-CM | POA: Diagnosis present

## 2019-07-01 DIAGNOSIS — J939 Pneumothorax, unspecified: Secondary | ICD-10-CM | POA: Diagnosis present

## 2019-07-01 DIAGNOSIS — E1142 Type 2 diabetes mellitus with diabetic polyneuropathy: Secondary | ICD-10-CM | POA: Diagnosis present

## 2019-07-01 DIAGNOSIS — Z952 Presence of prosthetic heart valve: Secondary | ICD-10-CM

## 2019-07-01 DIAGNOSIS — A419 Sepsis, unspecified organism: Secondary | ICD-10-CM | POA: Diagnosis not present

## 2019-07-01 DIAGNOSIS — Z888 Allergy status to other drugs, medicaments and biological substances status: Secondary | ICD-10-CM

## 2019-07-01 DIAGNOSIS — Z794 Long term (current) use of insulin: Secondary | ICD-10-CM

## 2019-07-01 DIAGNOSIS — Z79890 Hormone replacement therapy (postmenopausal): Secondary | ICD-10-CM

## 2019-07-01 LAB — GLUCOSE, CAPILLARY
Glucose-Capillary: 185 mg/dL — ABNORMAL HIGH (ref 70–99)
Glucose-Capillary: 202 mg/dL — ABNORMAL HIGH (ref 70–99)
Glucose-Capillary: 202 mg/dL — ABNORMAL HIGH (ref 70–99)
Glucose-Capillary: 209 mg/dL — ABNORMAL HIGH (ref 70–99)
Glucose-Capillary: 215 mg/dL — ABNORMAL HIGH (ref 70–99)
Glucose-Capillary: 217 mg/dL — ABNORMAL HIGH (ref 70–99)
Glucose-Capillary: 218 mg/dL — ABNORMAL HIGH (ref 70–99)

## 2019-07-01 LAB — COMPREHENSIVE METABOLIC PANEL
ALT: 82 U/L — ABNORMAL HIGH (ref 0–44)
ALT: 80 U/L — ABNORMAL HIGH (ref 0–44)
AST: 115 U/L — ABNORMAL HIGH (ref 15–41)
AST: 110 U/L — ABNORMAL HIGH (ref 15–41)
Albumin: 2.6 g/dL — ABNORMAL LOW (ref 3.5–5.0)
Albumin: 2.2 g/dL — ABNORMAL LOW (ref 3.5–5.0)
Alkaline Phosphatase: 65 U/L (ref 38–126)
Alkaline Phosphatase: 58 U/L (ref 38–126)
Anion gap: 12 (ref 5–15)
Anion gap: 15 (ref 5–15)
BUN: 78 mg/dL — ABNORMAL HIGH (ref 8–23)
BUN: 73 mg/dL — ABNORMAL HIGH (ref 8–23)
CO2: 13 mmol/L — ABNORMAL LOW (ref 22–32)
CO2: 15 mmol/L — ABNORMAL LOW (ref 22–32)
Calcium: 8.6 mg/dL — ABNORMAL LOW (ref 8.9–10.3)
Calcium: 8 mg/dL — ABNORMAL LOW (ref 8.9–10.3)
Chloride: 108 mmol/L (ref 98–111)
Chloride: 105 mmol/L (ref 98–111)
Creatinine, Ser: 2.86 mg/dL — ABNORMAL HIGH (ref 0.44–1.00)
Creatinine, Ser: 3.2 mg/dL — ABNORMAL HIGH (ref 0.44–1.00)
GFR calc Af Amer: 17 mL/min — ABNORMAL LOW (ref >60)
GFR calc Af Amer: 15 mL/min — ABNORMAL LOW (ref >60)
GFR calc non Af Amer: 15 mL/min — ABNORMAL LOW (ref >60)
GFR calc non Af Amer: 13 mL/min — ABNORMAL LOW (ref >60)
Glucose, Bld: 245 mg/dL — ABNORMAL HIGH (ref 70–99)
Glucose, Bld: 233 mg/dL — ABNORMAL HIGH (ref 70–99)
Potassium: 4.3 mmol/L (ref 3.5–5.1)
Potassium: 5 mmol/L (ref 3.5–5.1)
Sodium: 133 mmol/L — ABNORMAL LOW (ref 135–145)
Sodium: 135 mmol/L (ref 135–145)
Total Bilirubin: 1.7 mg/dL — ABNORMAL HIGH (ref 0.3–1.2)
Total Bilirubin: 1.9 mg/dL — ABNORMAL HIGH (ref 0.3–1.2)
Total Protein: 5.8 g/dL — ABNORMAL LOW (ref 6.5–8.1)
Total Protein: 5.4 g/dL — ABNORMAL LOW (ref 6.5–8.1)

## 2019-07-01 LAB — POCT I-STAT 7, (LYTES, BLD GAS, ICA,H+H)
Acid-base deficit: 15 mmol/L — ABNORMAL HIGH (ref 0.0–2.0)
Acid-base deficit: 11 mmol/L — ABNORMAL HIGH (ref 0.0–2.0)
Bicarbonate: 12.1 mmol/L — ABNORMAL LOW (ref 20.0–28.0)
Bicarbonate: 15.8 mmol/L — ABNORMAL LOW (ref 20.0–28.0)
Calcium, Ion: 1.29 mmol/L (ref 1.15–1.40)
Calcium, Ion: 1.21 mmol/L (ref 1.15–1.40)
HCT: 35 % — ABNORMAL LOW (ref 36.0–46.0)
HCT: 32 % — ABNORMAL LOW (ref 36.0–46.0)
Hemoglobin: 11.9 g/dL — ABNORMAL LOW (ref 12.0–15.0)
Hemoglobin: 10.9 g/dL — ABNORMAL LOW (ref 12.0–15.0)
O2 Saturation: 98 %
O2 Saturation: 99 %
Patient temperature: 36.4
Patient temperature: 96.4
Potassium: 4.5 mmol/L (ref 3.5–5.1)
Potassium: 4.3 mmol/L (ref 3.5–5.1)
Sodium: 134 mmol/L — ABNORMAL LOW (ref 135–145)
Sodium: 136 mmol/L (ref 135–145)
TCO2: 13 mmol/L — ABNORMAL LOW (ref 22–32)
TCO2: 17 mmol/L — ABNORMAL LOW (ref 22–32)
pCO2 arterial: 32.6 mmHg (ref 32.0–48.0)
pCO2 arterial: 34.6 mmHg (ref 32.0–48.0)
pH, Arterial: 7.174 — CL (ref 7.350–7.450)
pH, Arterial: 7.26 — ABNORMAL LOW (ref 7.350–7.450)
pO2, Arterial: 121 mmHg — ABNORMAL HIGH (ref 83.0–108.0)
pO2, Arterial: 131 mmHg — ABNORMAL HIGH (ref 83.0–108.0)

## 2019-07-01 LAB — LIPID PANEL
Cholesterol: 134 mg/dL (ref 0–200)
HDL: 29 mg/dL — ABNORMAL LOW (ref >40)
LDL Cholesterol: 45 mg/dL (ref 0–99)
Total CHOL/HDL Ratio: 4.6 RATIO
Triglycerides: 300 mg/dL — ABNORMAL HIGH (ref <150)
VLDL: 60 mg/dL — ABNORMAL HIGH (ref 0–40)

## 2019-07-01 LAB — PHOSPHORUS: Phosphorus: 5 mg/dL — ABNORMAL HIGH (ref 2.5–4.6)

## 2019-07-01 LAB — CBC WITH DIFFERENTIAL/PLATELET
Abs Immature Granulocytes: 0.05 10*3/uL (ref 0.00–0.07)
Basophils Absolute: 0 10*3/uL (ref 0.0–0.1)
Basophils Relative: 0 %
Eosinophils Absolute: 0 10*3/uL (ref 0.0–0.5)
Eosinophils Relative: 0 %
HCT: 35.4 % — ABNORMAL LOW (ref 36.0–46.0)
Hemoglobin: 11.7 g/dL — ABNORMAL LOW (ref 12.0–15.0)
Immature Granulocytes: 0 %
Lymphocytes Relative: 5 %
Lymphs Abs: 0.5 10*3/uL — ABNORMAL LOW (ref 0.7–4.0)
MCH: 28.5 pg (ref 26.0–34.0)
MCHC: 33.1 g/dL (ref 30.0–36.0)
MCV: 86.3 fL (ref 80.0–100.0)
Monocytes Absolute: 0.6 10*3/uL (ref 0.1–1.0)
Monocytes Relative: 5 %
Neutro Abs: 10.8 10*3/uL — ABNORMAL HIGH (ref 1.7–7.7)
Neutrophils Relative %: 90 %
Platelets: 124 10*3/uL — ABNORMAL LOW (ref 150–400)
RBC: 4.1 MIL/uL (ref 3.87–5.11)
RDW: 14 % (ref 11.5–15.5)
WBC: 12 10*3/uL — ABNORMAL HIGH (ref 4.0–10.5)
nRBC: 0 % (ref 0.0–0.2)

## 2019-07-01 LAB — BASIC METABOLIC PANEL
Anion gap: 15 (ref 5–15)
BUN: 77 mg/dL — ABNORMAL HIGH (ref 8–23)
CO2: 11 mmol/L — ABNORMAL LOW (ref 22–32)
Calcium: 8.8 mg/dL — ABNORMAL LOW (ref 8.9–10.3)
Chloride: 109 mmol/L (ref 98–111)
Creatinine, Ser: 2.79 mg/dL — ABNORMAL HIGH (ref 0.44–1.00)
GFR calc Af Amer: 18 mL/min — ABNORMAL LOW (ref >60)
GFR calc non Af Amer: 15 mL/min — ABNORMAL LOW (ref >60)
Glucose, Bld: 233 mg/dL — ABNORMAL HIGH (ref 70–99)
Potassium: 4.6 mmol/L (ref 3.5–5.1)
Sodium: 135 mmol/L (ref 135–145)

## 2019-07-01 LAB — TROPONIN I (HIGH SENSITIVITY)
Troponin I (High Sensitivity): 5506 ng/L (ref <18)
Troponin I (High Sensitivity): 5504 ng/L (ref <18)
Troponin I (High Sensitivity): 3675 ng/L (ref <18)
Troponin I (High Sensitivity): 2533 ng/L

## 2019-07-01 LAB — CBC
HCT: 36.6 % (ref 36.0–46.0)
Hemoglobin: 11.9 g/dL — ABNORMAL LOW (ref 12.0–15.0)
MCH: 28.5 pg (ref 26.0–34.0)
MCHC: 32.5 g/dL (ref 30.0–36.0)
MCV: 87.6 fL (ref 80.0–100.0)
Platelets: 124 10*3/uL — ABNORMAL LOW (ref 150–400)
RBC: 4.18 MIL/uL (ref 3.87–5.11)
RDW: 14.3 % (ref 11.5–15.5)
WBC: 15.6 10*3/uL — ABNORMAL HIGH (ref 4.0–10.5)
nRBC: 0 % (ref 0.0–0.2)

## 2019-07-01 LAB — PROTIME-INR
INR: 1.5 — ABNORMAL HIGH (ref 0.8–1.2)
INR: 1.6 — ABNORMAL HIGH (ref 0.8–1.2)
Prothrombin Time: 17.7 seconds — ABNORMAL HIGH (ref 11.4–15.2)
Prothrombin Time: 18.5 seconds — ABNORMAL HIGH (ref 11.4–15.2)

## 2019-07-01 LAB — APTT
aPTT: 39 seconds — ABNORMAL HIGH (ref 24–36)
aPTT: >200 seconds (ref 24–36)

## 2019-07-01 LAB — VANCOMYCIN, RANDOM: Vancomycin Rm: 25

## 2019-07-01 LAB — AMMONIA: Ammonia: 62 umol/L — ABNORMAL HIGH (ref 9–35)

## 2019-07-01 LAB — TYPE AND SCREEN
ABO/RH(D): O POS
Antibody Screen: NEGATIVE

## 2019-07-01 LAB — HEPARIN LEVEL (UNFRACTIONATED)
Heparin Unfractionated: 0.76 IU/mL — ABNORMAL HIGH (ref 0.30–0.70)
Heparin Unfractionated: 0.4 IU/mL (ref 0.30–0.70)

## 2019-07-01 LAB — HEMOGLOBIN A1C
Hgb A1c MFr Bld: 7.2 % — ABNORMAL HIGH (ref 4.8–5.6)
Mean Plasma Glucose: 159.94 mg/dL

## 2019-07-01 LAB — MAGNESIUM: Magnesium: 1.7 mg/dL (ref 1.7–2.4)

## 2019-07-01 LAB — ABO/RH: ABO/RH(D): O POS

## 2019-07-01 LAB — BRAIN NATRIURETIC PEPTIDE: B Natriuretic Peptide: 335 pg/mL — ABNORMAL HIGH (ref 0.0–100.0)

## 2019-07-01 LAB — LACTIC ACID, PLASMA: Lactic Acid, Venous: 4.5 mmol/L (ref 0.5–1.9)

## 2019-07-01 LAB — PROCALCITONIN: Procalcitonin: 5.62 ng/mL

## 2019-07-01 LAB — TSH: TSH: 1.405 u[IU]/mL (ref 0.350–4.500)

## 2019-07-01 LAB — MRSA PCR SCREENING: MRSA by PCR: POSITIVE — AB

## 2019-07-01 MED ORDER — NOREPINEPHRINE 16 MG/250ML-% IV SOLN
0.0000 ug/min | INTRAVENOUS | Status: DC
  Administered 2019-07-01 (×2): 40 ug/min via INTRAVENOUS
  Administered 2019-07-02: 50 ug/min via INTRAVENOUS
  Filled 2019-07-01 (×3): qty 250

## 2019-07-01 MED ORDER — INSULIN ASPART 100 UNIT/ML ~~LOC~~ SOLN
0.0000 [IU] | SUBCUTANEOUS | Status: DC
  Administered 2019-07-01 (×2): 3 [IU] via SUBCUTANEOUS
  Administered 2019-07-02: 2 [IU] via SUBCUTANEOUS
  Administered 2019-07-02: 3 [IU] via SUBCUTANEOUS

## 2019-07-01 MED ORDER — STERILE WATER FOR INJECTION IV SOLN
INTRAVENOUS | Status: DC
  Filled 2019-07-01 (×3): qty 850

## 2019-07-01 MED ORDER — VALPROATE SODIUM 500 MG/5ML IV SOLN
1750.0000 mg | Freq: Once | INTRAVENOUS | Status: AC
  Administered 2019-07-01: 1750 mg via INTRAVENOUS
  Filled 2019-07-01: qty 17.5

## 2019-07-01 MED ORDER — ASPIRIN 300 MG RE SUPP
300.0000 mg | RECTAL | Status: AC
  Administered 2019-07-01: 300 mg via RECTAL
  Filled 2019-07-01: qty 1

## 2019-07-01 MED ORDER — MAGNESIUM SULFATE 2 GM/50ML IV SOLN
2.0000 g | Freq: Once | INTRAVENOUS | Status: AC
  Administered 2019-07-01: 2 g via INTRAVENOUS
  Filled 2019-07-01: qty 50

## 2019-07-01 MED ORDER — NOREPINEPHRINE 4 MG/250ML-% IV SOLN
INTRAVENOUS | Status: AC
  Filled 2019-07-01: qty 250

## 2019-07-01 MED ORDER — VASOPRESSIN 20 UNIT/ML IV SOLN
0.0400 [IU]/min | INTRAVENOUS | Status: DC
  Administered 2019-07-01 – 2019-07-02 (×2): 0.04 [IU]/min via INTRAVENOUS
  Filled 2019-07-01 (×2): qty 2

## 2019-07-01 MED ORDER — MIDAZOLAM 50MG/50ML (1MG/ML) PREMIX INFUSION
10.0000 mg/h | INTRAVENOUS | Status: DC
  Administered 2019-07-01 – 2019-07-02 (×6): 10 mg/h via INTRAVENOUS
  Filled 2019-07-01 (×6): qty 50

## 2019-07-01 MED ORDER — MIDAZOLAM BOLUS VIA INFUSION
10.0000 mg | Freq: Once | INTRAVENOUS | Status: AC
  Administered 2019-07-01: 10 mg via INTRAVENOUS
  Filled 2019-07-01: qty 10

## 2019-07-01 MED ORDER — HEPARIN BOLUS VIA INFUSION
4000.0000 [IU] | Freq: Once | INTRAVENOUS | Status: AC
  Administered 2019-07-01: 4000 [IU] via INTRAVENOUS
  Filled 2019-07-01: qty 4000

## 2019-07-01 MED ORDER — AMIODARONE HCL IN DEXTROSE 360-4.14 MG/200ML-% IV SOLN
60.0000 mg/h | INTRAVENOUS | Status: AC
  Filled 2019-07-01: qty 200

## 2019-07-01 MED ORDER — VALPROIC ACID 250 MG/5ML PO SOLN
500.0000 mg | Freq: Three times a day (TID) | ORAL | Status: DC
  Administered 2019-07-01 (×2): 500 mg
  Filled 2019-07-01 (×3): qty 10

## 2019-07-01 MED ORDER — FENTANYL 2500MCG IN NS 250ML (10MCG/ML) PREMIX INFUSION
0.0000 ug/h | INTRAVENOUS | Status: DC
  Administered 2019-07-01: 250 ug/h via INTRAVENOUS
  Filled 2019-07-01: qty 250

## 2019-07-01 MED ORDER — PANTOPRAZOLE SODIUM 40 MG IV SOLR
40.0000 mg | INTRAVENOUS | Status: DC
  Administered 2019-07-01 – 2019-07-02 (×2): 40 mg via INTRAVENOUS
  Filled 2019-07-01 (×2): qty 40

## 2019-07-01 MED ORDER — INSULIN DETEMIR 100 UNIT/ML ~~LOC~~ SOLN
0.1000 [IU]/kg | Freq: Two times a day (BID) | SUBCUTANEOUS | Status: DC
  Administered 2019-07-01: 9 [IU] via SUBCUTANEOUS
  Filled 2019-07-01 (×2): qty 0.09

## 2019-07-01 MED ORDER — PIPERACILLIN-TAZOBACTAM IN DEX 2-0.25 GM/50ML IV SOLN
2.2500 g | Freq: Three times a day (TID) | INTRAVENOUS | Status: DC
  Administered 2019-07-01 – 2019-07-02 (×2): 2.25 g via INTRAVENOUS
  Filled 2019-07-01 (×4): qty 50

## 2019-07-01 MED ORDER — PANTOPRAZOLE SODIUM 40 MG IV SOLR: 40.0000 mg | Freq: Every day | INTRAVENOUS | Status: DC

## 2019-07-01 MED ORDER — SODIUM BICARBONATE 8.4 % IV SOLN
100.0000 meq | Freq: Once | INTRAVENOUS | Status: AC
  Administered 2019-07-01: 100 meq via INTRAVENOUS
  Filled 2019-07-01: qty 50

## 2019-07-01 MED ORDER — AMIODARONE HCL IN DEXTROSE 360-4.14 MG/200ML-% IV SOLN
30.0000 mg/h | INTRAVENOUS | Status: DC
  Administered 2019-07-01 – 2019-07-02 (×3): 30 mg/h via INTRAVENOUS
  Filled 2019-07-01 (×4): qty 200

## 2019-07-01 MED ORDER — INSULIN ASPART 100 UNIT/ML ~~LOC~~ SOLN
1.0000 [IU] | SUBCUTANEOUS | Status: DC
  Administered 2019-07-01: 3 [IU] via SUBCUTANEOUS
  Administered 2019-07-01: 2 [IU] via SUBCUTANEOUS
  Administered 2019-07-01: 3 [IU] via SUBCUTANEOUS

## 2019-07-01 MED ORDER — LACTATED RINGERS IV BOLUS: 1000.0000 mL | Freq: Once | INTRAVENOUS | Status: DC

## 2019-07-01 MED ORDER — CHLORHEXIDINE GLUCONATE 0.12% ORAL RINSE (MEDLINE KIT)
15.0000 mL | Freq: Two times a day (BID) | OROMUCOSAL | Status: DC
  Administered 2019-07-01 (×2): 15 mL via OROMUCOSAL

## 2019-07-01 MED ORDER — NOREPINEPHRINE 4 MG/250ML-% IV SOLN
0.0000 ug/min | INTRAVENOUS | Status: DC
Start: 2019-07-01 — End: 2019-07-01
  Administered 2019-07-01: 2 ug/min via INTRAVENOUS

## 2019-07-01 MED ORDER — HEPARIN (PORCINE) 25000 UT/250ML-% IV SOLN
700.0000 [IU]/h | INTRAVENOUS | Status: DC
  Administered 2019-07-01: 900 [IU]/h via INTRAVENOUS
  Administered 2019-07-02: 700 [IU]/h via INTRAVENOUS
  Filled 2019-07-01 (×2): qty 250

## 2019-07-01 MED ORDER — CHLORHEXIDINE GLUCONATE CLOTH 2 % EX PADS
6.0000 | MEDICATED_PAD | Freq: Every day | CUTANEOUS | Status: DC
  Administered 2019-07-01: 6 via TOPICAL

## 2019-07-01 MED ORDER — SODIUM CHLORIDE 0.9 % IV SOLN: INTRAVENOUS | Status: DC

## 2019-07-01 MED ORDER — LACTATED RINGERS IV SOLN: INTRAVENOUS | Status: DC

## 2019-07-01 MED ORDER — ORAL CARE MOUTH RINSE
15.0000 mL | OROMUCOSAL | Status: DC
  Administered 2019-07-01 – 2019-07-02 (×11): 15 mL via OROMUCOSAL

## 2019-07-01 MED ORDER — MIDAZOLAM HCL 2 MG/2ML IJ SOLN
1.0000 mg | INTRAMUSCULAR | Status: DC | PRN
  Administered 2019-07-01: 1 mg via INTRAVENOUS
  Filled 2019-07-01: qty 2

## 2019-07-01 MED ORDER — VANCOMYCIN VARIABLE DOSE PER UNSTABLE RENAL FUNCTION (PHARMACIST DOSING): Status: DC

## 2019-07-01 MED ORDER — PIPERACILLIN-TAZOBACTAM 3.375 G IVPB
3.3750 g | Freq: Three times a day (TID) | INTRAVENOUS | Status: DC
  Administered 2019-07-01 (×2): 3.375 g via INTRAVENOUS
  Filled 2019-07-01: qty 50

## 2019-07-01 MED ORDER — NOREPINEPHRINE 4 MG/250ML-% IV SOLN
0.0000 ug/min | INTRAVENOUS | Status: DC
  Administered 2019-07-01: 28 ug/min via INTRAVENOUS
  Administered 2019-07-01: 40 ug/min via INTRAVENOUS
  Filled 2019-07-01 (×2): qty 250

## 2019-07-01 NOTE — Progress Notes (Signed)
eLink Physician-Brief Progress Note Patient Name: Heather Lyons DOB: January 22, 1935 MRN: [id number]   Date of Service  07/03/2019  HPI/Events of Note  ABG on 40%/PRVC 18/TV 450/P 5 = 7.174/32.6/121.0.  eICU Interventions  Will order: 1. Increase PRVC rate to 24. 2. NaHCO3 100 meq IV now.  3. NaHCO3 IV infusion to run at 75 mL/hour. 4. Repeat ABG at 8 AM.      Intervention Category Major Interventions: Acid-Base disturbance - evaluation and management;Respiratory failure - evaluation and management  Lenell Antu 06/29/2019, 5:55 AM

## 2019-07-01 NOTE — Progress Notes (Signed)
ANTICOAGULATION CONSULT NOTE - Initial Consult  Pharmacy Consult for Heparin Indication: chest pain/ACS  Allergies  Allergen Reactions  . Adrenocorticotrophic Hormone [Corticotropin] Anaphylaxis and Swelling  . Epinephrine (Anaphylaxis) Hives and Other (See Comments)    syncope  . Ace Inhibitors     unknown  . Cold Medicine Plus [Chlorphen-Pseudoephed-Apap] Hives and Other (See Comments)    Comtrex; "passes out"  . Purell Instant Hand [Alcohol] Other (See Comments)    sneeze  . Aloe Vera Rash    Patient Measurements: Weight: 193 lb 2 oz (87.6 kg) Heparin Dosing Weight: 80 kg  Vital Signs: Temp: 96.6 F (35.9 C) (01/03 0215) BP: 117/47 (01/03 0400) Pulse Rate: 72 (01/03 0400)  Labs: Recent Labs    06/30/2019 0230  HGB 11.7*  HCT 35.4*  PLT 124*  APTT 39*  LABPROT 17.7*  INR 1.5*  CREATININE 2.86*  TROPONINIHS 5,506*    Estimated Creatinine Clearance: 16.9 mL/min (A) (by C-G formula based on SCr of 2.86 mg/dL (H)).   Medical History: Past Medical History:  Diagnosis Date  . Acute on chronic respiratory failure with hypoxia ([place])   . Acute renal failure due to tubular necrosis ([place])   . Arthritis   . Bilateral pulmonary contusion   . Colitis   . Coronary artery disease   . Diabetes mellitus without complication ([place])   . Diverticula of colon   . Hemothorax, traumatic, subsequent encounter   . Hypertension   . Neuropathy     Medications:  Medications Prior to Admission  Medication Sig Dispense Refill Last Dose  . acetaminophen (TYLENOL) 160 MG/5ML solution Place 20.3 mLs (650 mg total) into feeding tube every 6 (six) hours as needed for mild pain or fever. 120 mL 0   . Amino Acids-Protein Hydrolys (FEEDING SUPPLEMENT, PRO-STAT SUGAR FREE 64,) LIQD Place 30 mLs into feeding tube 3 (three) times daily. 887 mL 0   . antiseptic oral rinse (BIOTENE) LIQD 15 mLs by Mouth Rinse route as needed for dry mouth.     . Ascorbic Acid (VITAMIN C) 1000 MG tablet Take  1,000 mg by mouth daily.     Marland Kitchen aspirin 81 MG chewable tablet Chew 81 mg by mouth every other day. Pt alternates aspirin and Plavix due to previous nosebleeds.     . bethanechol (URECHOLINE) 25 MG tablet Place 1 tablet (25 mg total) into feeding tube 3 (three) times daily.     . cetirizine (ZYRTEC) 10 MG tablet Take 10 mg by mouth daily.     . Chlorhexidine Gluconate Cloth 2 % PADS Apply 6 each topically daily at 6 (six) AM.     . chlorhexidine gluconate, MEDLINE KIT, (PERIDEX) 0.12 % solution 15 mLs by Mouth Rinse route 2 (two) times daily. 120 mL 0   . cholecalciferol (VITAMIN D) 1000 units tablet Take 5,000 Units by mouth daily.     . Coenzyme Q10 200 MG TABS Take 200 mg by mouth daily.     Marland Kitchen enoxaparin (LOVENOX) 40 MG/0.4ML injection Inject 0.4 mLs (40 mg total) into the skin daily. 0 mL    . fenofibrate 160 MG tablet Take 160 mg by mouth daily.     . fentaNYL (SUBLIMAZE) 100 MCG/2ML injection Inject 0.5-1 mLs (25-50 mcg total) into the vein every hour as needed for severe pain. 2 mL 0   . Flaxseed, Linseed, (FLAXSEED OIL) 1000 MG CAPS Take 1 capsule by mouth daily.     . folic acid-pyridoxine-cyancobalamin (FOLBIC) 2.5-25-2 MG TABS tablet Take  1 tablet by mouth every 12 (twelve) hours.     . furosemide (LASIX) 10 MG/ML injection Inject 4 mLs (40 mg total) into the vein 2 (two) times daily. 4 mL 0   . gabapentin (NEURONTIN) 100 MG capsule Take 400 mg by mouth at bedtime.      Marland Kitchen glipiZIDE (GLUCOTROL) 5 MG tablet Take 20 mg by mouth daily before breakfast.     . guaiFENesin (ROBITUSSIN) 100 MG/5ML SOLN Place 15 mLs (300 mg total) into feeding tube every 6 (six) hours. 236 mL 0   . hydrALAZINE (APRESOLINE) 20 MG/ML injection Inject 0.5 mLs (10 mg total) into the vein every 2 (two) hours as needed (SBP > 180). 1 mL    . insulin aspart (NOVOLOG) 100 UNIT/ML injection Inject 0-20 Units into the skin every 4 (four) hours. 10 mL 11   . insulin detemir (LEVEMIR) 100 UNIT/ML injection Inject 0.2 mLs (20  Units total) into the skin at bedtime. 10 mL 11   . ipratropium-albuterol (DUONEB) 0.5-2.5 (3) MG/3ML SOLN Take 3 mLs by nebulization every 6 (six) hours as needed. 360 mL    . LORazepam (ATIVAN) 2 MG/ML injection Inject 0.5 mLs (1 mg total) into the vein every 4 (four) hours as needed for anxiety. 1 mL 0   . Melatonin 5 MG CAPS Take 5 mg by mouth at bedtime.     . methocarbamol 1,000 mg in dextrose 5 % 50 mL Inject 1,000 mg into the vein every 8 (eight) hours as needed.     Marland Kitchen morphine 4 MG/ML injection Inject 1 mL (4 mg total) into the vein every hour as needed for severe pain (breakthrough pain).  0   . mouth rinse LIQD solution 15 mLs by Mouth Rinse route 2 (two) times daily.  0   . naproxen sodium (ANAPROX) 220 MG tablet Take 220 mg by mouth daily.     . Nutritional Supplements (FEEDING SUPPLEMENT, PIVOT 1.5 CAL,) LIQD Place 1,000 mLs into feeding tube daily.  0   . omega-3 acid ethyl esters (LOVAZA) 1 g capsule Take 1 g by mouth daily.      . ondansetron (ZOFRAN) 4 MG/2ML SOLN injection Inject 2 mLs (4 mg total) into the vein every 6 (six) hours as needed for nausea. 2 mL 0   . oxyCODONE (ROXICODONE) 5 MG/5ML solution Place 5-10 mLs (5-10 mg total) into feeding tube every 4 (four) hours as needed for moderate pain.  0   . pantoprazole sodium (PROTONIX) 40 mg/20 mL PACK Place 20 mLs (40 mg total) into feeding tube daily. 30 mL    . potassium chloride 10 MEQ/100ML Inject 100 mLs (10 mEq total) into the vein every 1 hour x 6 doses.     Marland Kitchen psyllium (HYDROCIL/METAMUCIL) 95 % PACK Place 1 packet into feeding tube 2 (two) times daily. 240 each    . QUEtiapine (SEROQUEL) 25 MG tablet Place 1 tablet (25 mg total) into feeding tube 2 (two) times daily.     . Red Yeast Rice 600 MG CAPS Take 1 capsule by mouth daily.     . sodium chloride 0.9 % infusion Inject 10 mLs into the vein as needed (for administration of IV medications (carrier fluid)).  0   . sodium chloride flush (NS) 0.9 % SOLN 10-40 mLs by  Intracatheter route every 12 (twelve) hours.     . sodium chloride flush (NS) 0.9 % SOLN 10-40 mLs by Intracatheter route as needed (flush).     Marland Kitchen  vancomycin 1,750 mg in sodium chloride 0.9 % 500 mL Inject 1,750 mg into the vein daily.     . Water For Irrigation, Sterile (FREE WATER) SOLN Place 250 mLs into feeding tube every 8 (eight) hours.       Assessment: 85 y.o. female with h/o CAD transferred from [facility] s/p cardiac arrest, elevated cardiac markers and possible ACS, for heparin.  Hypothermia protocol initiated with goal temp 36C   Goal of Therapy:  Heparin level 0.3-0.7 units/ml Monitor platelets by anticoagulation protocol: Yes   Plan:  Heparin 4000 units IV bolus, then start heparin 900 units/hr Check heparin level in 8 hours.   Caryl Pina 07/22/2019,4:08 AM

## 2019-07-01 NOTE — Consult Note (Signed)
NEURO HOSPITALIST CONSULT NOTE   Requestig physician: Dr. Gilford Raid  Reason for Consult: Myoclonus after cardiac arrest  History obtained from:  Chart     HPI:                                                                                                                                          Heather Lyons is an 85 y.o. female who initially presented to [facility] after collapsing at her home on Saturday. She was found to be in V-tach arrest. ACLS protocol was initiated. Total downtime was approximately 30 minutes. CT at OSH reportedly showed no acute abnormality. At [facility] she exhibited "seizure-like activity" that CCM at [facility] feels was most likely myoclonus. After transfer to [facility], synchronous, whole-body myoclonus occurring at irregular intervals was noted. Neurology was called for recommendations on management. She is now on TTM with goal of 36 degrees. She has an arterial line for hemodynamic monitoring and frequent blood draws. AKI is noted on labs. Also with mild elevation of transaminases.   Past Medical History:  Diagnosis Date  . Acute on chronic respiratory failure with hypoxia ([place])   . Acute renal failure due to tubular necrosis ([place])   . Arthritis   . Bilateral pulmonary contusion   . Colitis   . Coronary artery disease   . Diabetes mellitus without complication ([place])   . Diverticula of colon   . Hemothorax, traumatic, subsequent encounter   . Hypertension   . Neuropathy     Past Surgical History:  Procedure Laterality Date  . ABDOMINAL HYSTERECTOMY    . AORTIC VALVE REPLACEMENT    . APPENDECTOMY    . APPLICATION OF WOUND VAC N/A 01/03/2019   Procedure: Application Of Wound Vac;  Surgeon: Georganna Skeans, MD;  Location: [place];  Service: General;  Laterality: N/A;  . CHOLECYSTECTOMY    . FOOT NEUROMA SURGERY    . ILEOSTOMY N/A 01/05/2019   Procedure: ILEOSTOMY;  Surgeon: Georganna Skeans, MD;  Location: [place];  Service: General;  Laterality: N/A;  . KNEE  ARTHROSCOPY    . LAPAROTOMY N/A 01/03/2019   Procedure: EXPLORATORY LAPAROTOMY WITH RESECTION OF DISTAL ILEUM AND CECUM;  Surgeon: Georganna Skeans, MD;  Location: [place];  Service: General;  Laterality: N/A;  . LAPAROTOMY N/A 01/05/2019   Procedure: EXPLORATORY LAPAROTOMY WITH ABDOMINAL CLOSURE;  Surgeon: Georganna Skeans, MD;  Location: [place];  Service: General;  Laterality: N/A;  . PACEMAKER INSERTION    . PERCUTANEOUS TRACHEOSTOMY N/A 01/18/2019   Procedure: BEDSIDE TRACHEOSTOMY;  Surgeon: Georganna Skeans, MD;  Location[facility] OR;  Service: General;  Laterality: N/A;    Family History  Problem Relation Age of Onset  . Colon cancer Mother   . Hodgkin's lymphoma Mother   . Heart disease Father   .  AAA (abdominal aortic aneurysm) Father   . Aneurysm Father   . Heart disease Sister   . Colon cancer Sister               Social History:  reports that she has never smoked. She has never used smokeless tobacco. She reports that she does not drink alcohol or use drugs.  Allergies  Allergen Reactions  . Adrenocorticotrophic Hormone [Corticotropin] Anaphylaxis and Swelling  . Epinephrine (Anaphylaxis) Hives and Other (See Comments)    syncope  . Ace Inhibitors     unknown  . Cold Medicine Plus [Chlorphen-Pseudoephed-Apap] Hives and Other (See Comments)    Comtrex; "passes out"  . Purell Instant Hand [Alcohol] Other (See Comments)    sneeze  . Aloe Vera Rash    MEDICATIONS:                                                                                                                     Prior to Admission:  Medications Prior to Admission  Medication Sig Dispense Refill Last Dose  . acetaminophen (TYLENOL) 160 MG/5ML solution Place 20.3 mLs (650 mg total) into feeding tube every 6 (six) hours as needed for mild pain or fever. 120 mL 0   . Amino Acids-Protein Hydrolys (FEEDING SUPPLEMENT, PRO-STAT SUGAR FREE 64,) LIQD Place 30 mLs into feeding tube 3 (three) times daily. 887 mL 0   .  antiseptic oral rinse (BIOTENE) LIQD 15 mLs by Mouth Rinse route as needed for dry mouth.     . Ascorbic Acid (VITAMIN C) 1000 MG tablet Take 1,000 mg by mouth daily.     Marland Kitchen aspirin 81 MG chewable tablet Chew 81 mg by mouth every other day. Pt alternates aspirin and Plavix due to previous nosebleeds.     . bethanechol (URECHOLINE) 25 MG tablet Place 1 tablet (25 mg total) into feeding tube 3 (three) times daily.     . cetirizine (ZYRTEC) 10 MG tablet Take 10 mg by mouth daily.     . Chlorhexidine Gluconate Cloth 2 % PADS Apply 6 each topically daily at 6 (six) AM.     . chlorhexidine gluconate, MEDLINE KIT, (PERIDEX) 0.12 % solution 15 mLs by Mouth Rinse route 2 (two) times daily. 120 mL 0   . cholecalciferol (VITAMIN D) 1000 units tablet Take 5,000 Units by mouth daily.     . Coenzyme Q10 200 MG TABS Take 200 mg by mouth daily.     Marland Kitchen enoxaparin (LOVENOX) 40 MG/0.4ML injection Inject 0.4 mLs (40 mg total) into the skin daily. 0 mL    . fenofibrate 160 MG tablet Take 160 mg by mouth daily.     . fentaNYL (SUBLIMAZE) 100 MCG/2ML injection Inject 0.5-1 mLs (25-50 mcg total) into the vein every hour as needed for severe pain. 2 mL 0   . Flaxseed, Linseed, (FLAXSEED OIL) 1000 MG CAPS Take 1 capsule by mouth daily.     . folic acid-pyridoxine-cyancobalamin (FOLBIC) 2.5-25-2 MG TABS tablet Take 1 tablet  by mouth every 12 (twelve) hours.     . furosemide (LASIX) 10 MG/ML injection Inject 4 mLs (40 mg total) into the vein 2 (two) times daily. 4 mL 0   . gabapentin (NEURONTIN) 100 MG capsule Take 400 mg by mouth at bedtime.      Marland Kitchen glipiZIDE (GLUCOTROL) 5 MG tablet Take 20 mg by mouth daily before breakfast.     . guaiFENesin (ROBITUSSIN) 100 MG/5ML SOLN Place 15 mLs (300 mg total) into feeding tube every 6 (six) hours. 236 mL 0   . hydrALAZINE (APRESOLINE) 20 MG/ML injection Inject 0.5 mLs (10 mg total) into the vein every 2 (two) hours as needed (SBP > 180). 1 mL    . insulin aspart (NOVOLOG) 100 UNIT/ML  injection Inject 0-20 Units into the skin every 4 (four) hours. 10 mL 11   . insulin detemir (LEVEMIR) 100 UNIT/ML injection Inject 0.2 mLs (20 Units total) into the skin at bedtime. 10 mL 11   . ipratropium-albuterol (DUONEB) 0.5-2.5 (3) MG/3ML SOLN Take 3 mLs by nebulization every 6 (six) hours as needed. 360 mL    . LORazepam (ATIVAN) 2 MG/ML injection Inject 0.5 mLs (1 mg total) into the vein every 4 (four) hours as needed for anxiety. 1 mL 0   . Melatonin 5 MG CAPS Take 5 mg by mouth at bedtime.     . methocarbamol 1,000 mg in dextrose 5 % 50 mL Inject 1,000 mg into the vein every 8 (eight) hours as needed.     Marland Kitchen morphine 4 MG/ML injection Inject 1 mL (4 mg total) into the vein every hour as needed for severe pain (breakthrough pain).  0   . mouth rinse LIQD solution 15 mLs by Mouth Rinse route 2 (two) times daily.  0   . naproxen sodium (ANAPROX) 220 MG tablet Take 220 mg by mouth daily.     . Nutritional Supplements (FEEDING SUPPLEMENT, PIVOT 1.5 CAL,) LIQD Place 1,000 mLs into feeding tube daily.  0   . omega-3 acid ethyl esters (LOVAZA) 1 g capsule Take 1 g by mouth daily.      . ondansetron (ZOFRAN) 4 MG/2ML SOLN injection Inject 2 mLs (4 mg total) into the vein every 6 (six) hours as needed for nausea. 2 mL 0   . oxyCODONE (ROXICODONE) 5 MG/5ML solution Place 5-10 mLs (5-10 mg total) into feeding tube every 4 (four) hours as needed for moderate pain.  0   . pantoprazole sodium (PROTONIX) 40 mg/20 mL PACK Place 20 mLs (40 mg total) into feeding tube daily. 30 mL    . potassium chloride 10 MEQ/100ML Inject 100 mLs (10 mEq total) into the vein every 1 hour x 6 doses.     Marland Kitchen psyllium (HYDROCIL/METAMUCIL) 95 % PACK Place 1 packet into feeding tube 2 (two) times daily. 240 each    . QUEtiapine (SEROQUEL) 25 MG tablet Place 1 tablet (25 mg total) into feeding tube 2 (two) times daily.     . Red Yeast Rice 600 MG CAPS Take 1 capsule by mouth daily.     . sodium chloride 0.9 % infusion Inject 10  mLs into the vein as needed (for administration of IV medications (carrier fluid)).  0   . sodium chloride flush (NS) 0.9 % SOLN 10-40 mLs by Intracatheter route every 12 (twelve) hours.     . sodium chloride flush (NS) 0.9 % SOLN 10-40 mLs by Intracatheter route as needed (flush).     . vancomycin 1,750  mg in sodium chloride 0.9 % 500 mL Inject 1,750 mg into the vein daily.     . Water For Irrigation, Sterile (FREE WATER) SOLN Place 250 mLs into feeding tube every 8 (eight) hours.      Scheduled: . chlorhexidine gluconate (MEDLINE KIT)  15 mL Mouth Rinse BID  . Chlorhexidine Gluconate Cloth  6 each Topical Daily  . insulin aspart  1-3 Units Subcutaneous Q4H  . insulin detemir  0.1 Units/kg Subcutaneous BID  . mouth rinse  15 mL Mouth Rinse 10 times per day  . midazolam  10 mg Intravenous Once  . pantoprazole (PROTONIX) IV  40 mg Intravenous Q24H  . valproic acid  500 mg Per Tube TID   Continuous: . sodium chloride Stopped (07/25/2019 0610)  . amiodarone 30 mg/hr (07/26/2019 0718)  . fentaNYL infusion INTRAVENOUS 250 mcg/hr (07/09/2019 0700)  . heparin 900 Units/hr (07/18/2019 0700)  . lactated ringers 999 mL/hr at 07/21/2019 0504  . lactated ringers 100 mL/hr at 07/07/2019 0506  . midazolam    . norepinephrine (LEVOPHED) Adult infusion 32 mcg/min (07/09/2019 0700)  . piperacillin-tazobactam (ZOSYN)  IV 12.5 mL/hr at 07/20/2019 0700  .  sodium bicarbonate (isotonic) infusion in sterile water 75 mL/hr at 07/05/2019 0700  . valproate sodium       ROS:                                                                                                                                       Unable to obtain due to coma.    Blood pressure (!) 112/46, pulse 76, temperature (!) 96.6 F (35.9 C), resp. rate (!) 26, weight 87.6 kg, SpO2 100 %.   General Examination:                                                                                                       Physical Exam  HEENT-  [place]/AT    Lungs-  Intubated Extremities- Cooling apparatus in place to BLE.   Neurological Examination Mental Status: On fentanyl sedation with GCS 3t.  Cranial Nerves: II: Pupils unreactive. No blink to threat.  III,IV, VI: Weak doll's eye reflex is present. Eyes conjugately at the midline.  V,VII: Face flaccidly symmetric. No corneal reflexes.  VIII: No response to voice.  IX,X: Intubated XI: Unable to assess XII: Intubated Motor/Sensory: Flaccid tone BUE. Increased extensor tone BLE. No movement to any stimuli.  Deep Tendon Reflexes: Low amplitude upper extremity reflexes and patellar reflexes. Toes  mute bilaterally.   Cerebellar/Gait: Unable to assess    Lab Results: Basic Metabolic Panel: Recent Labs  Lab 07/28/2019 0230 07/22/2019 0444 07/04/2019 0508  NA 133* 134* 135  K 4.3 4.5 4.6  CL 108  --  109  CO2 13*  --  11*  GLUCOSE 245*  --  233*  BUN 78*  --  77*  CREATININE 2.86*  --  2.79*  CALCIUM 8.6*  --  8.8*  MG 1.7  --   --   PHOS 5.0*  --   --     CBC: Recent Labs  Lab 07/28/2019 0230 07/23/2019 0444 07/18/2019 0508  WBC 12.0*  --  15.6*  NEUTROABS 10.8*  --   --   HGB 11.7* 11.9* 11.9*  HCT 35.4* 35.0* 36.6  MCV 86.3  --  87.6  PLT 124*  --  124*    Cardiac Enzymes: No results for input(s): CKTOTAL, CKMB, CKMBINDEX, TROPONINI in the last 168 hours.  Lipid Panel: Recent Labs  Lab 07/11/2019 0508  CHOL 134  TRIG 300*  HDL 29*  CHOLHDL 4.6  VLDL 60*  LDLCALC 45    Imaging: DG CHEST PORT 1 VIEW  Result Date: 07/11/2019 CLINICAL DATA:  Initial evaluation for intubation. EXAM: PORTABLE CHEST 1 VIEW COMPARISON:  Prior radiograph from 06/30/2019. FINDINGS: Endotracheal tube in place with tip positioned approximately 5.4 cm above the carina. Enteric tube in place with tip in the proximal stomach, side hole above the GE junction. Left-sided pacemaker/AICD noted. Median sternotomy wires underlying valvular prosthesis noted. Stable cardiomegaly. Mediastinal silhouette within  normal limits. Aortic atherosclerosis. Lungs normally inflated. Chest tube in place at the right lung base, stable. No visible pneumothorax. Right lung remains clear. Persistent dense opacity at the left lung base, which could reflect atelectasis or consolidation. No pulmonary edema or definite pleural effusion. Osseous structures are unchanged. IMPRESSION: 1. Tip of the endotracheal tube approximately 5.4 cm above the carina. 2. Enteric tube in place with tip in the proximal stomach, side hole above the GE junction. 3. Stable right basilar chest tube. No visible pneumothorax. 4. Dense opacity at the left lung base, which could reflect atelectasis or infiltrate, stable. Electronically Signed   By: Jeannine Boga M.D.   On: 07/26/2019 02:00    Assessment: 85 year old female s/p 30 minute cardiac arrest. Persistent myoclonus noted by ICU team.  1. CT head at OSH was reportedly negative for acute abnormality.  2. Overall presentation is most consistent with severe diffuse anoxic brain injury. Myoclonus occurring within a short time frame following cardiac arrest typically carries a dismal prognosis.   Recommendations: 1. MRI brain when myoclonus is successfully suppressed, either by Depakote or by Versed gtt.  2. EEG.  3. Loading with Valproic acid 20 mg/kg IV. This will be followed by 500 mg per tube TID (IV formulation is in a Producer, television/film/video).  4. Versed bolus of 10 mg IV, followed by drip at a rate of 10 mg.hr. Will use clinical exams to titrate towards the goal of cessation of myoclonus.   35 minutes spent in the neurological assessment and management of this critically ill patient. Time spent included coordination of care.   Electronically signed: Dr. Kerney Elbe 07/07/2019, 7:14 AM

## 2019-07-01 NOTE — H&P (Addendum)
..   NAME:  Cienna Dumais, MRN:  [id number], DOB:  1935-04-28, LOS: 0 ADMISSION DATE:  07/13/2019, CONSULTATION DATE:  07/06/2019 REFERRING MD:  [place], CHIEF COMPLAINT:  Cardiac Arrest   Brief History   85 yr old F w/ h/o CABG, AVR, IDDM w/ peripheral neuropathy, colostomy, s/p Vtach Arrest downtime approx 30 mins.  Trx from [place]. Intubated noted to have a right sided pneumothorax s/p chest tube now to suction. + Mycolonic activity noted on arrival. PCCM asked to admit for post arrest management   History of present illness   (Pt is intubated. History obtained from paper records from [place] and review of EMR)  85 yr old F w/ PMHx of CABG, AVR, DM, colostomy presented to [place] on 06/30/2019 after collapsing at her home. She was found in [place] arrest ( pt has a Pacemaker). ACLS protocol initiated, pt received compressions via LUCAS and She received Lidocaine. Upon arrival to [facility] ED she received a Central line (in left femoral due to failed attempts at both right and left IJ) and endotracheal intubation. During her management there she exhibited seizure like activity ( pt received a total 58m Lorazepam).  On CXR pt was noted to have right pneumothorax so patient received right sided chest tube which has some bloody drainage upon placement per documentation sent.   Significant Labs: Na 129 K+ 6.1 BUN 76 Cr 2.1 GFR 23 Calcium 9.1 AST 83 ALT 58 Alk phos 79 Trop 1 0.34 Pro BNP 428 Albumin 3.4 Glucose 238 mg/dl LA 3.1 --> 4.2 Hgb 12.4 Hct 38.8 WBC 13.8 Plts 141 UA: yellow cloudy ph 5 protein 1 + ketones and glucose negative 3+ blood 2+ leuk esterase  ABG 06/30/2019 at 16:30 7.02/40/418/10.3 on vent settings AC Mode TV 450 RR 16 FiO2 100 PEEP 5  Past Medical History  ..Marland KitchenActive Ambulatory Problems    Diagnosis Date Noted  . SDH (subdural hematoma) ([place] 04/29/2016  . Dehydration   . Syncope and collapse   . Bradycardia   . Right bundle branch block   . Acute renal  failure superimposed on chronic kidney disease (HE[place]   . Left rib fracture 01/01/2019  . Elevated troponin   . Type 2 diabetes mellitus with hyperlipidemia ([place] 01/12/2019  . Ischemia, bowel (HF[place] 01/12/2019  . Respiratory failure (HCenter 01/12/2019  . Trauma of chest 01/12/2019  . Pneumothorax, closed, traumatic, initial encounter 01/12/2019  . Acute on chronic respiratory failure with hypoxia (H[place]   . Hemothorax, traumatic, subsequent encounter   . Bilateral pulmonary contusion   . Acute renal failure due to tubular necrosis ([place]   . Closed nondisplaced fracture of body of right calcaneus   . Diabetic polyneuropathy associated with type 2 diabetes mellitus (H[place]   . Severe protein-calorie malnutrition (HK[place]   . Pressure injury of right heel, stage 1    Resolved Ambulatory Problems    Diagnosis Date Noted  . No Resolved Ambulatory Problems   Past Medical History:  Diagnosis Date  . Arthritis   . Colitis   . Coronary artery disease   . Diabetes mellitus without complication ([place]   . Diverticula of colon   . Hypertension   . Neuropathy     Significant Hospital Events   Cardiac arrest Endotracheal intubation Right chest tube [facility] Consults:  None at time of evaluation  Procedures:  Endotracheally intubated Right chest tube placement  Significant Diagnostic Tests:  CTH w/o contrast 06/30/2019 at 18:50 IMPRESSION: Chronic atrophic  and ischemic changes without acute abnormality.   CT CHEST W/O CONTRAST 06/30/2019 Aortic calcifications noted without aneurysmal dilatation. No significant cardiac enlargement is seen, Prior aortic valve replacement was noted with heavy calcification of the mitral annulus . Pacing device (Left-sided pacemaker/AICD) noted.  Thoracic inlet wnl. ETT and gastric catheter are in satisfactory position No sizable hilar or mediastinal lymphadenopathy noted No acute rib fractures were seen. Old healed rib fractures present on the  left. Overall impression: Moderate-sized right pneumothorax  CXR:      Micro Data:  06/30/2019: 1651 >>SARSCOV2 RAPID IN HOUSE NEGATIVE   Antimicrobials:  06/30/19: Zosyn   06/30/19: vancomycin   Objective   SpO2 100 %.    Vent Mode: PRVC FiO2 (%):  [40 %] 40 % Set Rate:  [22 bmp] 22 bmp Vt Set:  [450 mL] 450 mL PEEP:  [5 cmH20] 5 cmH20 Plateau Pressure:  [17 cmH20] 17 cmH20  No intake or output data in the 24 hours ending 07/29/2019 0240 There were no vitals filed for this visit.  Examination: General: intubated  HENT: normocephalic Pupils Lungs: decreased at bases with no wheezing or rhonci Cardiovascular: S1 and S2 appreciated Abdomen: soft non distended abdomen decreased BS Extremities: SCDs  Neuro: not withdrawing from painful/noxious stimuli,  GU: temp foley   Assessment & Plan:  1. S/p Cardiac Arrest Vtach arrest reported Downtime: Plan: Initiate TTM with goal of 36 degrees Place an Arterial line for hemodynamic monitoring and frequent blood draws Started on Amiodarone by Elink  Check QTc avoid any meds that may prolong it  F/u Mg++, calcium, K+ levels and replete as needed   2. Acute Encephalopathy In the setting of post cardiac arrest GCS 3 myoclonic activity noted on evaluation CTH showed no acute intracranial pathology Plan: EEG Consulted Neurology will see patient, given AKI will use Depakote Continue on TTM protocol  3. Acute Respiratory failure w/ hypoxia Endotracheally intubated ( 7.5 at 23 at lip) Right sided Pneumothorax s/p chest tube--> Chest tube in place with No visible pneumothorax on most recent CXR Left base >>atelectasis vs consolidation Plan: Continues on full MV support TV 8cc/kg  VAPrecautions including HOB elevated >30 deg Pulmonary hygiene F/u resp cultures, Check RVP, including Influenza and PCT   4. Shock misxed picture of cardiogenic vs septic S/p cardiac arrest Currently on Levophed gtt LLL consolidation vs  atelectasis UA w/ + leuk est Plan: F/u Trops Continue on cardiac monitoring  Continue on Vasopressors for MAP goal >65mmHg Given significant cardiac history and recent cardiac arrest anticipate decline in EF F/u 2D ECHO to assess LVEF F/u Lipid panel ( has a h/o elevated TGs on Fibrates), TSH F/u cultures continue Broad spectrum abx with vancomycin and zosyn ( f/u vanco level before dosing again)   5. IDDM Per Home meds on Levemir and Novolog Plan: Check HgbA1c Continue on ISS Goal BG 140-180mg/dl  6.Acute Kidney Injury  H/o Urinary incontinence  secondary to hypoperfusion Pt also received Vancomycin and Zosyn for empiric coverage On home Bethanechol Plan: Hold home Bethanechol Avoid nephrotoxic meds Continue on IVF (LR @100) Insert Temp foley part of TTM protocol Monitor Is and Os  F/u Vancomycin level  Best practice:  Diet: NPO Pain/Anxiety/Delirium protocol (if indicated): RASS goal 0 to -1 VAP protocol (if indicated): yes DVT prophylaxis: SCDs ( on Heparin ACS protocol) GI prophylaxis: Protonix Glucose control:  If BG exceeds 180mg/dl will start ISS Mobility: bedrest Code Status: Full  Family Communication: Daughter (Becky Owens) and Son ( Jimmy   Sanseverino)  listed on Facesheet  Disposition: ICU  Labs   CBC: No results for input(s): WBC, NEUTROABS, HGB, HCT, MCV, PLT in the last 168 hours.  Basic Metabolic Panel: No results for input(s): NA, K, CL, CO2, GLUCOSE, BUN, CREATININE, CALCIUM, MG, PHOS in the last 168 hours. GFR: CrCl cannot be calculated (Patient's most recent lab result is older than the maximum 21 days allowed.). No results for input(s): PROCALCITON, WBC, LATICACIDVEN in the last 168 hours.  Liver Function Tests: No results for input(s): AST, ALT, ALKPHOS, BILITOT, PROT, ALBUMIN in the last 168 hours. No results for input(s): LIPASE, AMYLASE in the last 168 hours. No results for input(s): AMMONIA in the last 168 hours.  ABG    Component Value  Date/Time   PHART 7.448 01/19/2019 2130   PCO2ART 49.1 (H) 01/19/2019 2130   PO2ART 82.6 (L) 01/19/2019 2130   HCO3 33.4 (H) 01/19/2019 2130   TCO2 38 (H) 01/19/2019 0117   ACIDBASEDEF 2.0 01/04/2019 1108   O2SAT 95.5 01/19/2019 2130     Coagulation Profile: No results for input(s): INR, PROTIME in the last 168 hours.  Cardiac Enzymes: No results for input(s): CKTOTAL, CKMB, CKMBINDEX, TROPONINI in the last 168 hours.  HbA1C: Hgb A1c MFr Bld  Date/Time Value Ref Range Status  01/20/2019 05:33 AM 7.0 (H) 4.8 - 5.6 % Final    Comment:    (NOTE) Pre diabetes:          5.7%-6.4% Diabetes:              >6.4% Glycemic control for   <7.0% adults with diabetes   01/02/2019 08:33 AM 9.5 (H) 4.8 - 5.6 % Final    Comment:    (NOTE) Pre diabetes:          5.7%-6.4% Diabetes:              >6.4% Glycemic control for   <7.0% adults with diabetes     CBG: Recent Labs  Lab 07/21/2019 0238  GLUCAP 217*    Review of Systems:   Marland KitchenMarland KitchenReview of Systems  Unable to perform ROS: Intubated     Past Medical History  She,  has a past medical history of Acute on chronic respiratory failure with hypoxia ([place]), Acute renal failure due to tubular necrosis ([place]), Arthritis, Bilateral pulmonary contusion, Colitis, Coronary artery disease, Diabetes mellitus without complication ([place]), Diverticula of colon, Hemothorax, traumatic, subsequent encounter, Hypertension, and Neuropathy.   Surgical History    Past Surgical History:  Procedure Laterality Date  . ABDOMINAL HYSTERECTOMY    . AORTIC VALVE REPLACEMENT    . APPENDECTOMY    . APPLICATION OF WOUND VAC N/A 01/03/2019   Procedure: Application Of Wound Vac;  Surgeon: Georganna Skeans, MD;  Location: [place];  Service: General;  Laterality: N/A;  . CHOLECYSTECTOMY    . FOOT NEUROMA SURGERY    . ILEOSTOMY N/A 01/05/2019   Procedure: ILEOSTOMY;  Surgeon: Georganna Skeans, MD;  Location: [place];  Service: General;  Laterality: N/A;  . KNEE ARTHROSCOPY     . LAPAROTOMY N/A 01/03/2019   Procedure: EXPLORATORY LAPAROTOMY WITH RESECTION OF DISTAL ILEUM AND CECUM;  Surgeon: Georganna Skeans, MD;  Location: [place];  Service: General;  Laterality: N/A;  . LAPAROTOMY N/A 01/05/2019   Procedure: EXPLORATORY LAPAROTOMY WITH ABDOMINAL CLOSURE;  Surgeon: Georganna Skeans, MD;  Location: [place];  Service: General;  Laterality: N/A;  . PACEMAKER INSERTION    . PERCUTANEOUS TRACHEOSTOMY N/A 01/18/2019   Procedure: BEDSIDE TRACHEOSTOMY;  Surgeon: Thompson, Burke, MD;  Location[facility];  Service: General;  Laterality: N/A;     Social History   reports that she has never smoked. She has never used smokeless tobacco. She reports that she does not drink alcohol or use drugs.   Family History   Her family history includes AAA (abdominal aortic aneurysm) in her father; Aneurysm in her father; Colon cancer in her mother and sister; Heart disease in her father and sister; Hodgkin's lymphoma in her mother.   Allergies Allergies  Allergen Reactions  . Adrenocorticotrophic Hormone [Corticotropin] Anaphylaxis and Swelling  . Epinephrine (Anaphylaxis) Hives and Other (See Comments)    syncope  . Ace Inhibitors     unknown  . Cold Medicine Plus [Chlorphen-Pseudoephed-Apap] Hives and Other (See Comments)    Comtrex; "passes out"  . Purell Instant Hand [Alcohol] Other (See Comments)    sneeze  . Aloe Vera Rash     Home Medications  Prior to Admission medications   Medication Sig Start Date End Date Taking? Authorizing Provider  acetaminophen (TYLENOL) 160 MG/5ML solution Place 20.3 mLs (650 mg total) into feeding tube every 6 (six) hours as needed for mild pain or fever. 01/19/19   Rayburn, Kelly A, PA-C  Amino Acids-Protein Hydrolys (FEEDING SUPPLEMENT, PRO-STAT SUGAR FREE 64,) LIQD Place 30 mLs into feeding tube 3 (three) times daily. 01/19/19   Rayburn, Kelly A, PA-C  antiseptic oral rinse (BIOTENE) LIQD 15 mLs by Mouth Rinse route as needed for dry mouth. 01/19/19    Rayburn, Kelly A, PA-C  Ascorbic Acid (VITAMIN C) 1000 MG tablet Take 1,000 mg by mouth daily.    [provider]  aspirin 81 MG chewable tablet Chew 81 mg by mouth every other day. Pt alternates aspirin and Plavix due to previous nosebleeds.    [provider]  bethanechol (URECHOLINE) 25 MG tablet Place 1 tablet (25 mg total) into feeding tube 3 (three) times daily. 01/19/19   Rayburn, Kelly A, PA-C  cetirizine (ZYRTEC) 10 MG tablet Take 10 mg by mouth daily.    [provider]  Chlorhexidine Gluconate Cloth 2 % PADS Apply 6 each topically daily at 6 (six) AM. 01/19/19   Rayburn, Kelly A, PA-C  chlorhexidine gluconate, MEDLINE KIT, (PERIDEX) 0.12 % solution 15 mLs by Mouth Rinse route 2 (two) times daily. 01/19/19   Rayburn, Kelly A, PA-C  cholecalciferol (VITAMIN D) 1000 units tablet Take 5,000 Units by mouth daily.    [provider]  Coenzyme Q10 200 MG TABS Take 200 mg by mouth daily.    [provider]  enoxaparin (LOVENOX) 40 MG/0.4ML injection Inject 0.4 mLs (40 mg total) into the skin daily. 01/20/19   Rayburn, Kelly A, PA-C  fenofibrate 160 MG tablet Take 160 mg by mouth daily.    [provider]  fentaNYL (SUBLIMAZE) 100 MCG/2ML injection Inject 0.5-1 mLs (25-50 mcg total) into the vein every hour as needed for severe pain. 01/19/19   Rayburn, Kelly A, PA-C  Flaxseed, Linseed, (FLAXSEED OIL) 1000 MG CAPS Take 1 capsule by mouth daily.    [provider]  folic acid-pyridoxine-cyancobalamin (FOLBIC) 2.5-25-2 MG TABS tablet Take 1 tablet by mouth every 12 (twelve) hours.    [provider]  furosemide (LASIX) 10 MG/ML injection Inject 4 mLs (40 mg total) into the vein 2 (two) times daily. 01/19/19   Rayburn, Kelly A, PA-C  gabapentin (NEURONTIN) 100 MG capsule Take 400 mg by mouth at bedtime.     [provider]  glipiZIDE (GLUCOTROL) 5 MG tablet Take 20 mg by mouth daily before breakfast.    [provider]   guaiFENesin (ROBITUSSIN) 100 MG/5ML SOLN Place 15 mLs (300 mg total) into feeding tube every 6 (six) hours. 01/19/19   Rayburn, Floyce Stakes, PA-C  hydrALAZINE (APRESOLINE) 20 MG/ML injection Inject 0.5 mLs (10 mg total) into the vein every 2 (two) hours as needed (SBP > 180). 01/19/19   Rayburn, Claiborne Billings A, PA-C  insulin aspart (NOVOLOG) 100 UNIT/ML injection Inject 0-20 Units into the skin every 4 (four) hours. 01/19/19   Rayburn, Floyce Stakes, PA-C  insulin detemir (LEVEMIR) 100 UNIT/ML injection Inject 0.2 mLs (20 Units total) into the skin at bedtime. 01/19/19   Rayburn, Floyce Stakes, PA-C  ipratropium-albuterol (DUONEB) 0.5-2.5 (3) MG/3ML SOLN Take 3 mLs by nebulization every 6 (six) hours as needed. 01/19/19   Rayburn, Floyce Stakes, PA-C  LORazepam (ATIVAN) 2 MG/ML injection Inject 0.5 mLs (1 mg total) into the vein every 4 (four) hours as needed for anxiety. 01/19/19   Rayburn, Floyce Stakes, PA-C  Melatonin 5 MG CAPS Take 5 mg by mouth at bedtime.    [provider]  methocarbamol 1,000 mg in dextrose 5 % 50 mL Inject 1,000 mg into the vein every 8 (eight) hours as needed. 01/19/19   Rayburn, Floyce Stakes, PA-C  morphine 4 MG/ML injection Inject 1 mL (4 mg total) into the vein every hour as needed for severe pain (breakthrough pain). 01/19/19   Rayburn, Floyce Stakes, PA-C  mouth rinse LIQD solution 15 mLs by Mouth Rinse route 2 (two) times daily. 01/19/19   Rayburn, Floyce Stakes, PA-C  naproxen sodium (ANAPROX) 220 MG tablet Take 220 mg by mouth daily.    [provider]  Nutritional Supplements (FEEDING SUPPLEMENT, PIVOT 1.5 CAL,) LIQD Place 1,000 mLs into feeding tube daily. 01/20/19   Rayburn, Claiborne Billings A, PA-C  omega-3 acid ethyl esters (LOVAZA) 1 g capsule Take 1 g by mouth daily.     [provider]  ondansetron (ZOFRAN) 4 MG/2ML SOLN injection Inject 2 mLs (4 mg total) into the vein every 6 (six) hours as needed for nausea. 01/19/19   Rayburn, Floyce Stakes, PA-C  oxyCODONE (ROXICODONE) 5 MG/5ML solution Place 5-10 mLs  (5-10 mg total) into feeding tube every 4 (four) hours as needed for moderate pain. 01/19/19   Rayburn, Floyce Stakes, PA-C  pantoprazole sodium (PROTONIX) 40 mg/20 mL PACK Place 20 mLs (40 mg total) into feeding tube daily. 01/20/19   Rayburn, Floyce Stakes, PA-C  potassium chloride 10 MEQ/100ML Inject 100 mLs (10 mEq total) into the vein every 1 hour x 6 doses. 01/19/19   Rayburn, Claiborne Billings A, PA-C  psyllium (HYDROCIL/METAMUCIL) 95 % PACK Place 1 packet into feeding tube 2 (two) times daily. 01/19/19   Rayburn, Floyce Stakes, PA-C  QUEtiapine (SEROQUEL) 25 MG tablet Place 1 tablet (25 mg total) into feeding tube 2 (two) times daily. 01/19/19   Rayburn, Floyce Stakes, PA-C  Red Yeast Rice 600 MG CAPS Take 1 capsule by mouth daily.    [provider]  sodium chloride 0.9 % infusion Inject 10 mLs into the vein as needed (for administration of IV medications (carrier fluid)). 01/19/19   Rayburn, Claiborne Billings A, PA-C  sodium chloride flush (NS) 0.9 % SOLN 10-40 mLs by Intracatheter route every 12 (twelve) hours. 01/19/19   Rayburn, Claiborne Billings A, PA-C  sodium chloride flush (NS) 0.9 % SOLN 10-40 mLs by Intracatheter route as needed (flush). 01/19/19  Rayburn, Kelly A, PA-C  vancomycin 1,750 mg in sodium chloride 0.9 % 500 mL Inject 1,750 mg into the vein daily. 01/20/19   Rayburn, Kelly A, PA-C  Water For Irrigation, Sterile (FREE WATER) SOLN Place 250 mLs into feeding tube every 8 (eight) hours. 01/19/19   Rayburn, Kelly A, PA-C    STAFF NOTE  I, Dr   have personally reviewed patient's available data, including medical history, events of note, physical examination and test results as part of my evaluation. I have discussed with PA and other care providers such as pharmacist, RN and Elink.  The patient is critically ill with multiple organ systems failure and requires high complexity decision making for assessment and support, frequent evaluation and titration of therapies, application of advanced monitoring technologies and  extensive interpretation of multiple databases.   Critical Care Time devoted to patient care services described in this note is 65 Minutes. This time reflects time of care of this signee Dr  . This critical care time does not reflect procedure time, or teaching time or supervisory time but could involve care discussion time   CC TIME: 65  minutes CODE STATUS: FULL DISPOSITION: ICU PROGNOSIS: Guarded  Dr.   Pulmonary Critical Care Medicine  07/04/2019 3:04 AM   Critical care time: 65 mins        

## 2019-07-01 NOTE — Progress Notes (Signed)
PCCM Interval Note  Briefly, 85 year old female s/p PM transferred from [place] for VT arrest. Noted to have right pneumothorax requiring chest tube and seizure-like activity.  S: On TTM in the unit. Remains intubated and sedated  Blood pressure (!) 106/45, pulse 72, temperature 97.9 F (36.6 C), temperature source Bladder, resp. rate (!) 24, weight 87.6 kg, SpO2 99 %.  On my exam, chronically and elderly female on mechanically ventilation. Sedation. EEG leads in place. PERRL with upward deviation. No witnessed myoclonus jerks. Systolic ejection murmur present.  Assessment/Plan  Acute encephalopathy secondary to cardiac arrest, seizure - Appreciate Neurology input. On depakote and versed gtt. F/u EEG  VTach arrest - Troponin 5506. On TTM. On amiodarone gtt. F/u echocardiogram. Trend troponin  Cardiogenic shock. Cannot rule out septic shock. Continue levophed. Start vasopressin. MAP goal >65. Continue broad spectrum antibiotics. Trend LA. F/u echo  Acute hypoxemic respiratory failure secondary to critical illness, ptx, chronic diastolic heart failure Right pneumothorax - Chest tube on suction -20  AKI with metabolic acidosis - Continue sodium bicarb gtt  DVT ppx: Heparin SQ  GI ppx: PPI Family: Discuss GOC  The patient is critically ill with multiple organ systems failure and requires high complexity decision making for assessment and support, frequent evaluation and titration of therapies, application of advanced monitoring technologies and extensive interpretation of multiple databases.   Critical Care Time devoted to patient care services described in this note is 45 Minutes. This time reflects time of care of this signee Dr. Mechele Collin. This critical care time does not reflect procedure time, or teaching time or supervisory time of PA/NP/Med student/Med Resident etc but could involve care discussion time.   Discussed plan of care with Resident and RN  Mechele Collin,  M.D. [facility] Pulmonary/Critical Care Medicine Jul 21, 2019 10:32 AM   Please see Amion for pager number to reach on-call Pulmonary and Critical Care Team.

## 2019-07-01 NOTE — Progress Notes (Signed)
eLink Physician-Brief Progress Note Patient Name: Heather Lyons DOB: [birth year] MRN: [id number]   Date of Service  07/15/2019  HPI/Events of Note  Patient arrives in transfer from [facility]. History of cardiac arrest. Intubated and ventilated. No further information available to eLink.  eICU Interventions  Will order: 1. Ventilator orders: 40%/PRVC 22/TV 450/P 5. 2. ABG now. 3. Portable CXR now. 4. Protonix IV. 5. Place SCD's to bilateral lower extremities. 6. Versed 1 mg IV Q 1 hour PRN seizures or anoxic myoclonus. 7. Fentanyl IV infusion. Titrate to RASS = 0. 8. Amiodarone IV infusion.      Intervention Category Evaluation Type: New Patient Evaluation  Lenell Antu 06/29/2019, 1:45 AM

## 2019-07-01 NOTE — Procedures (Signed)
Arterial Catheter Insertion Procedure Note Heather Lyons [id number] [birth year]  Procedure: Insertion of Arterial Catheter  Indications: Blood pressure monitoring and Frequent blood sampling  Procedure Details Consent: Unable to obtain consent because of emergent medical necessity. Time Out: Verified patient identification, verified procedure, site/side was marked, verified correct patient position, special equipment/implants available, medications/allergies/relevent history reviewed, required imaging and test results available.  Performed  Maximum sterile technique was used including antiseptics, cap, gloves, gown, hand hygiene, mask and sheet. Skin prep: Chlorhexidine; local anesthetic administered 20 gauge catheter was inserted into left radial artery using the Seldinger technique. ULTRASOUND GUIDANCE USED: NO Evaluation Blood flow good; BP tracing good. Complications: No apparent complications.   Heather Lyons R Heather Lyons 07/30/19

## 2019-07-01 NOTE — Progress Notes (Signed)
ANTICOAGULATION CONSULT NOTE - Follow Up Consult  Pharmacy Consult for Heparin Indication: chest pain/ACS  Allergies  Allergen Reactions  . Adrenocorticotrophic Hormone [Corticotropin] Anaphylaxis and Swelling  . Amoxicillin-Pot Clavulanate Diarrhea, Itching and Rash    Pt refuses causes diarrhea realizes not allergy   . Comtrex Cold & Cough Max St  [Dm-Phenylephrine-Acetaminophen] Anaphylaxis and Other (See Comments)    Puts in a coma state (sleep) Pt passed out   . Epinephrine (Anaphylaxis) Hives and Other (See Comments)    syncope  . Alcohol Other (See Comments)    sneeze sneezing  . Ace Inhibitors     unknown  . Chlorphenesin Other (See Comments) and Hives    Comtrex; "passes out"  . Cold Medicine Plus [Chlorphen-Pseudoephed-Apap] Hives and Other (See Comments)    Comtrex; "passes out"  . Allopurinol Rash    HIVES  . Aloe Vera Rash  . Metformin And Related Diarrhea    Patient Measurements: Weight: 193 lb 2 oz (87.6 kg) Heparin Dosing Weight: 87 kg  Vital Signs: Temp: 96.6 F (35.9 C) (01/03 1200) Temp Source: Bladder (01/03 1200) BP: 124/45 (01/03 1300) Pulse Rate: 61 (01/03 1300)  Labs: Recent Labs    07/11/2019 0230 07/07/2019 0444 07/15/2019 0508 07/25/2019 0842 07/20/2019 1250  HGB 11.7* 11.9* 11.9* 10.9*  --   HCT 35.4* 35.0* 36.6 32.0*  --   PLT 124*  --  124*  --   --   APTT 39*  --   --   --  >200*  LABPROT 17.7*  --   --   --   --   INR 1.5*  --   --   --   --   HEPARINUNFRC  --   --   --   --  0.76*  CREATININE 2.86*  --  2.79*  --  3.20*  TROPONINIHS 5,506*  --  5,504*  --  3,675*    Estimated Creatinine Clearance: 15.1 mL/min (A) (by C-G formula based on SCr of 3.2 mg/dL (H)).   Medical History: Past Medical History:  Diagnosis Date  . Acute on chronic respiratory failure with hypoxia ([place])   . Acute renal failure due to tubular necrosis ([place])   . Arthritis   . Bilateral pulmonary contusion   . Colitis   . Coronary artery disease   .  Diabetes mellitus without complication ([place])   . Diverticula of colon   . Hemothorax, traumatic, subsequent encounter   . Hypertension   . Neuropathy     Medications:  Medications Prior to Admission  Medication Sig Dispense Refill Last Dose  . acetaminophen (TYLENOL) 160 MG/5ML solution Place 20.3 mLs (650 mg total) into feeding tube every 6 (six) hours as needed for mild pain or fever. (Patient taking differently: Take 650 mg by mouth every 6 (six) hours as needed for mild pain or fever. ) 120 mL 0 06/29/2019 at Unknown time  . Amino Acids-Protein Hydrolys (FEEDING SUPPLEMENT, PRO-STAT SUGAR FREE 64,) LIQD Place 30 mLs into feeding tube 3 (three) times daily. 887 mL 0 06/30/2019 at Unknown time  . amitriptyline (ELAVIL) 25 MG tablet Take 75 mg by mouth at bedtime.   06/29/2019  . famotidine (PEPCID) 20 MG tablet Take 20 mg by mouth 2 (two) times daily.   06/29/2019  . gabapentin (NEURONTIN) 300 MG capsule Take 300 mg by mouth at bedtime.    06/30/2019  . glipiZIDE (GLUCOTROL) 5 MG tablet Take 2.5 mg by mouth daily before breakfast.    06/30/2019  .  antiseptic oral rinse (BIOTENE) LIQD 15 mLs by Mouth Rinse route as needed for dry mouth. (Patient not taking: Reported on 07/23/2019)   Not Taking at Unknown time  . bethanechol (URECHOLINE) 25 MG tablet Place 1 tablet (25 mg total) into feeding tube 3 (three) times daily. (Patient not taking: Reported on 07/24/2019)   Not Taking at Unknown time  . Chlorhexidine Gluconate Cloth 2 % PADS Apply 6 each topically daily at 6 (six) AM. (Patient not taking: Reported on 07/25/2019)   Not Taking at Unknown time  . chlorhexidine gluconate, MEDLINE KIT, (PERIDEX) 0.12 % solution 15 mLs by Mouth Rinse route 2 (two) times daily. (Patient not taking: Reported on 07/27/2019) 120 mL 0 Not Taking at Unknown time  . enoxaparin (LOVENOX) 40 MG/0.4ML injection Inject 0.4 mLs (40 mg total) into the skin daily. (Patient not taking: Reported on 07/03/2019) 0 mL  Not Taking at Unknown time  .  fentaNYL (SUBLIMAZE) 100 MCG/2ML injection Inject 0.5-1 mLs (25-50 mcg total) into the vein every hour as needed for severe pain. (Patient not taking: Reported on 07/28/2019) 2 mL 0 Not Taking at Unknown time  . furosemide (LASIX) 10 MG/ML injection Inject 4 mLs (40 mg total) into the vein 2 (two) times daily. (Patient not taking: Reported on 07/27/2019) 4 mL 0 Not Taking at Unknown time  . guaiFENesin (ROBITUSSIN) 100 MG/5ML SOLN Place 15 mLs (300 mg total) into feeding tube every 6 (six) hours. (Patient not taking: Reported on 07/07/2019) 236 mL 0 Not Taking at Unknown time  . hydrALAZINE (APRESOLINE) 20 MG/ML injection Inject 0.5 mLs (10 mg total) into the vein every 2 (two) hours as needed (SBP > 180). (Patient not taking: Reported on 07/14/2019) 1 mL  Not Taking at Unknown time  . insulin aspart (NOVOLOG) 100 UNIT/ML injection Inject 0-20 Units into the skin every 4 (four) hours. (Patient not taking: Reported on 07/13/2019) 10 mL 11 Not Taking at Unknown time  . insulin detemir (LEVEMIR) 100 UNIT/ML injection Inject 0.2 mLs (20 Units total) into the skin at bedtime. (Patient not taking: Reported on 07/17/2019) 10 mL 11 Not Taking at Unknown time  . ipratropium-albuterol (DUONEB) 0.5-2.5 (3) MG/3ML SOLN Take 3 mLs by nebulization every 6 (six) hours as needed. (Patient not taking: Reported on 07/15/2019) 360 mL  Not Taking at Unknown time  . LORazepam (ATIVAN) 2 MG/ML injection Inject 0.5 mLs (1 mg total) into the vein every 4 (four) hours as needed for anxiety. (Patient not taking: Reported on 07/12/2019) 1 mL 0 Not Taking at Unknown time  . methocarbamol 1,000 mg in dextrose 5 % 50 mL Inject 1,000 mg into the vein every 8 (eight) hours as needed. (Patient not taking: Reported on 07/19/2019)   Not Taking at Unknown time  . morphine 4 MG/ML injection Inject 1 mL (4 mg total) into the vein every hour as needed for severe pain (breakthrough pain). (Patient not taking: Reported on 07/13/2019)  0 Not Taking at Unknown time  .  mouth rinse LIQD solution 15 mLs by Mouth Rinse route 2 (two) times daily. (Patient not taking: Reported on 06/29/2019)  0 Not Taking at Unknown time  . Nutritional Supplements (FEEDING SUPPLEMENT, PIVOT 1.5 CAL,) LIQD Place 1,000 mLs into feeding tube daily. (Patient not taking: Reported on 07/20/2019)  0 Not Taking at Unknown time  . ondansetron (ZOFRAN) 4 MG/2ML SOLN injection Inject 2 mLs (4 mg total) into the vein every 6 (six) hours as needed for nausea. (Patient not taking: Reported  on 06/30/2019) 2 mL 0 Not Taking at Unknown time  . oxyCODONE (ROXICODONE) 5 MG/5ML solution Place 5-10 mLs (5-10 mg total) into feeding tube every 4 (four) hours as needed for moderate pain. (Patient not taking: Reported on 07/27/2019)  0 Not Taking at Unknown time  . pantoprazole sodium (PROTONIX) 40 mg/20 mL PACK Place 20 mLs (40 mg total) into feeding tube daily. (Patient not taking: Reported on 07/05/2019) 30 mL  Not Taking at Unknown time  . potassium chloride 10 MEQ/100ML Inject 100 mLs (10 mEq total) into the vein every 1 hour x 6 doses. (Patient not taking: Reported on 07/18/2019)   Not Taking at Unknown time  . psyllium (HYDROCIL/METAMUCIL) 95 % PACK Place 1 packet into feeding tube 2 (two) times daily. (Patient not taking: Reported on 07/29/2019) 240 each  Not Taking at Unknown time  . QUEtiapine (SEROQUEL) 25 MG tablet Place 1 tablet (25 mg total) into feeding tube 2 (two) times daily. (Patient not taking: Reported on 07/21/2019)   Not Taking at Unknown time  . sodium chloride 0.9 % infusion Inject 10 mLs into the vein as needed (for administration of IV medications (carrier fluid)). (Patient not taking: Reported on 07/22/2019)  0 Not Taking at Unknown time  . sodium chloride flush (NS) 0.9 % SOLN 10-40 mLs by Intracatheter route every 12 (twelve) hours. (Patient not taking: Reported on 07/11/2019)   Not Taking at Unknown time  . sodium chloride flush (NS) 0.9 % SOLN 10-40 mLs by Intracatheter route as needed (flush). (Patient  not taking: Reported on 07/23/2019)   Not Taking at Unknown time  . Water For Irrigation, Sterile (FREE WATER) SOLN Place 250 mLs into feeding tube every 8 (eight) hours. (Patient not taking: Reported on 07/10/2019)   Not Taking at Unknown time    Assessment: 85 y.o. female with h/o CAD transferred from [facility] s/p cardiac arrest, elevated cardiac markers and possible ACS, for heparin.  Hypothermia protocol initiated with goal temp 36C  Initial heparin level is elevated at 0.76 and aPTT >200.    Goal of Therapy:  Heparin level 0.3-0.7 units/ml Monitor platelets by anticoagulation protocol: Yes   Plan:  Reduce heparin to 700 units/hr (~8 units/kg/hr) Check heparin level in 6 hours Once rewarming begins, will need closer monitoring of heparin due to increased heparin clearance Daily heparin level and CBC  Vertis Kelch, PharmD, [facility] PGY2 Cardiology Pharmacy Resident Phone [phone] 07/03/2019       2:32 PM  Please check [email or web address] for unit-specific pharmacist phone numbers

## 2019-07-01 NOTE — Procedures (Signed)
ELECTROENCEPHALOGRAM REPORT   Patient: Heather Lyons       Room #: 2H17C EEG No. ID: [id number] Age: 85 y.o.        Sex: female Referring Physician: Otelia Limes Report Date:  07-27-19        Interpreting Physician: Thana Farr  History: Kamauri Kathol is an 85 y.o. female with myoclonus s/p cardiac arrest  Medications:  Insulin, Zosyn, Depacon, Amiodarone, Fentanyl, Heparin, Versed, Levophed  Conditions of Recording:  This is a 21 channel routine scalp EEG performed with bipolar and monopolar montages arranged in accordance to the international 10/20 system of electrode placement. One channel was dedicated to EKG recording.  The patient is in the intubated and sedated state.  Description:  The background activity is discontinuous. It consists of bursts of polyspike, spike and slow wave activity alternating with periods of attenuation. This burst activity lasts from 1-2 seconds and is accompanied by clinical myoclonic activity.  The periods of attenuation most commonly lasts from 4 to 10 seconds, but can be seen to last up to one minute.  The patient is stimulated during the recording with no activation or change in the background rhythm noted.  Hyperventilation and intermittent photic stimulation were not performed.   IMPRESSION: This is an abnormal EEG due to a burst-suppression pattern seen throughout the tracing.  Burst activity does correspond to clinical myoclonic activity.     Thana Farr, MD Neurology [phone] Jul 27, 2019, 11:03 AM

## 2019-07-01 NOTE — Progress Notes (Signed)
MD Scatliffe notified about patient not making urine and troponin 5506. Awaiting orders. Will continue to monitor.

## 2019-07-01 NOTE — Progress Notes (Signed)
EEG complete - results pending 

## 2019-07-01 NOTE — Progress Notes (Signed)
LTM EEG hooked up and running - no initial skin breakdown - push button tested - neuro notified.  

## 2019-07-01 NOTE — Progress Notes (Signed)
ELink notified of abnormal ABG. Awaiting orders. Will continue to monitor.

## 2019-07-02 ENCOUNTER — Inpatient Hospital Stay (HOSPITAL_COMMUNITY): Payer: Medicare Other

## 2019-07-02 DIAGNOSIS — G40901 Epilepsy, unspecified, not intractable, with status epilepticus: Secondary | ICD-10-CM

## 2019-07-02 LAB — BASIC METABOLIC PANEL
Anion gap: 20 — ABNORMAL HIGH (ref 5–15)
BUN: 73 mg/dL — ABNORMAL HIGH (ref 8–23)
CO2: 15 mmol/L — ABNORMAL LOW (ref 22–32)
Calcium: 7.6 mg/dL — ABNORMAL LOW (ref 8.9–10.3)
Chloride: 99 mmol/L (ref 98–111)
Creatinine, Ser: 3.43 mg/dL — ABNORMAL HIGH (ref 0.44–1.00)
GFR calc Af Amer: 14 mL/min — ABNORMAL LOW (ref >60)
GFR calc non Af Amer: 12 mL/min — ABNORMAL LOW (ref >60)
Glucose, Bld: 220 mg/dL — ABNORMAL HIGH (ref 70–99)
Potassium: 4.6 mmol/L (ref 3.5–5.1)
Sodium: 134 mmol/L — ABNORMAL LOW (ref 135–145)

## 2019-07-02 LAB — GLUCOSE, CAPILLARY
Glucose-Capillary: 194 mg/dL — ABNORMAL HIGH (ref 70–99)
Glucose-Capillary: 187 mg/dL — ABNORMAL HIGH (ref 70–99)

## 2019-07-02 LAB — POCT I-STAT 7, (LYTES, BLD GAS, ICA,H+H)
Acid-base deficit: 11 mmol/L — ABNORMAL HIGH (ref 0.0–2.0)
Acid-base deficit: 9 mmol/L — ABNORMAL HIGH (ref 0.0–2.0)
Bicarbonate: 16.1 mmol/L — ABNORMAL LOW (ref 20.0–28.0)
Bicarbonate: 17.2 mmol/L — ABNORMAL LOW (ref 20.0–28.0)
Calcium, Ion: 1.24 mmol/L (ref 1.15–1.40)
Calcium, Ion: 1.11 mmol/L — ABNORMAL LOW (ref 1.15–1.40)
HCT: 33 % — ABNORMAL LOW (ref 36.0–46.0)
HCT: 29 % — ABNORMAL LOW (ref 36.0–46.0)
Hemoglobin: 11.2 g/dL — ABNORMAL LOW (ref 12.0–15.0)
Hemoglobin: 9.9 g/dL — ABNORMAL LOW (ref 12.0–15.0)
O2 Saturation: 98 %
O2 Saturation: 95 %
Patient temperature: 36.2
Patient temperature: 36.1
Potassium: 4.6 mmol/L (ref 3.5–5.1)
Potassium: 4.8 mmol/L (ref 3.5–5.1)
Sodium: 137 mmol/L (ref 135–145)
Sodium: 134 mmol/L — ABNORMAL LOW (ref 135–145)
TCO2: 17 mmol/L — ABNORMAL LOW (ref 22–32)
TCO2: 18 mmol/L — ABNORMAL LOW (ref 22–32)
pCO2 arterial: 38.8 mmHg (ref 32.0–48.0)
pCO2 arterial: 35.2 mmHg (ref 32.0–48.0)
pH, Arterial: 7.223 — ABNORMAL LOW (ref 7.350–7.450)
pH, Arterial: 7.292 — ABNORMAL LOW (ref 7.350–7.450)
pO2, Arterial: 121 mmHg — ABNORMAL HIGH (ref 83.0–108.0)
pO2, Arterial: 81 mmHg — ABNORMAL LOW (ref 83.0–108.0)

## 2019-07-02 LAB — CBC
HCT: 30.3 % — ABNORMAL LOW (ref 36.0–46.0)
Hemoglobin: 10.2 g/dL — ABNORMAL LOW (ref 12.0–15.0)
MCH: 28.9 pg (ref 26.0–34.0)
MCHC: 33.7 g/dL (ref 30.0–36.0)
MCV: 85.8 fL (ref 80.0–100.0)
Platelets: 103 10*3/uL — ABNORMAL LOW (ref 150–400)
RBC: 3.53 MIL/uL — ABNORMAL LOW (ref 3.87–5.11)
RDW: 14.9 % (ref 11.5–15.5)
WBC: 11.5 10*3/uL — ABNORMAL HIGH (ref 4.0–10.5)
nRBC: 0 % (ref 0.0–0.2)

## 2019-07-02 LAB — HEPARIN LEVEL (UNFRACTIONATED): Heparin Unfractionated: 0.43 IU/mL (ref 0.30–0.70)

## 2019-07-02 LAB — TROPONIN I (HIGH SENSITIVITY): Troponin I (High Sensitivity): 2004 ng/L (ref <18)

## 2019-07-02 MED ORDER — LEVETIRACETAM IN NACL 500 MG/100ML IV SOLN: 500.0000 mg | Freq: Two times a day (BID) | INTRAVENOUS | Status: DC

## 2019-07-02 MED ORDER — MORPHINE BOLUS VIA INFUSION
5.0000 mg | INTRAVENOUS | Status: DC | PRN
  Filled 2019-07-02: qty 5

## 2019-07-02 MED ORDER — DIPHENHYDRAMINE HCL 50 MG/ML IJ SOLN: 25.0000 mg | INTRAMUSCULAR | Status: DC | PRN

## 2019-07-02 MED ORDER — MORPHINE SULFATE (PF) 2 MG/ML IV SOLN: 2.0000 mg | INTRAVENOUS | Status: DC | PRN

## 2019-07-02 MED ORDER — ACETAMINOPHEN 325 MG PO TABS: 650.0000 mg | ORAL_TABLET | Freq: Four times a day (QID) | ORAL | Status: DC | PRN

## 2019-07-02 MED ORDER — POLYVINYL ALCOHOL 1.4 % OP SOLN
1.0000 [drp] | Freq: Four times a day (QID) | OPHTHALMIC | Status: DC | PRN
  Filled 2019-07-02: qty 15

## 2019-07-02 MED ORDER — GLYCOPYRROLATE 1 MG PO TABS
1.0000 mg | ORAL_TABLET | ORAL | Status: DC | PRN
  Filled 2019-07-02: qty 1

## 2019-07-02 MED ORDER — MAGNESIUM SULFATE 2 GM/50ML IV SOLN
2.0000 g | Freq: Once | INTRAVENOUS | Status: DC
  Filled 2019-07-02: qty 50

## 2019-07-02 MED ORDER — AMIODARONE IV BOLUS ONLY 150 MG/100ML: 150.0000 mg | Freq: Once | INTRAVENOUS | Status: DC

## 2019-07-02 MED ORDER — DEXTROSE 5 % IV SOLN: INTRAVENOUS | Status: DC

## 2019-07-02 MED ORDER — SODIUM BICARBONATE 8.4 % IV SOLN
100.0000 meq | Freq: Once | INTRAVENOUS | Status: AC
  Administered 2019-07-02: 100 meq via INTRAVENOUS
  Filled 2019-07-02: qty 50

## 2019-07-02 MED ORDER — NOREPINEPHRINE 16 MG/250ML-% IV SOLN
0.0000 ug/kg/min | INTRAVENOUS | Status: DC
  Administered 2019-07-02: 0.95 ug/kg/min via INTRAVENOUS
  Administered 2019-07-02: 0.742 ug/kg/min via INTRAVENOUS
  Filled 2019-07-02 (×3): qty 250

## 2019-07-02 MED ORDER — MORPHINE 100MG IN NS 100ML (1MG/ML) PREMIX INFUSION: 0.0000 mg/h | INTRAVENOUS | Status: DC

## 2019-07-02 MED ORDER — GLYCOPYRROLATE 0.2 MG/ML IJ SOLN: 0.2000 mg | INTRAMUSCULAR | Status: DC | PRN

## 2019-07-02 MED ORDER — NOREPINEPHRINE 16 MG/250ML-% IV SOLN: 0.0000 ug/min | INTRAVENOUS | Status: DC

## 2019-07-02 MED ORDER — ACETAMINOPHEN 650 MG RE SUPP: 650.0000 mg | Freq: Four times a day (QID) | RECTAL | Status: DC | PRN

## 2019-07-02 MED ORDER — AMIODARONE LOAD VIA INFUSION
150.0000 mg | Freq: Once | INTRAVENOUS | Status: AC
  Administered 2019-07-02: 150 mg via INTRAVENOUS
  Filled 2019-07-02: qty 83.34

## 2019-07-02 MED ORDER — EPINEPHRINE PF 1 MG/ML IJ SOLN
0.5000 ug/min | INTRAVENOUS | Status: DC
  Administered 2019-07-02: 10 ug/min via INTRAVENOUS
  Filled 2019-07-02: qty 4

## 2019-07-06 LAB — CULTURE, BLOOD (ROUTINE X 2)
Culture: NO GROWTH
Culture: NO GROWTH
Special Requests: ADEQUATE

## 2019-07-30 NOTE — Progress Notes (Signed)
eLink Physician-Brief Progress Note Patient Name: Heather Lyons DOB: 02/23/1935 MRN: [id number]   Date of Service  2019-07-06  HPI/Events of Note  Patient is on vaso drip +  levophed drip at 50 mcg/min and not meeting MAP target.  eICU Interventions  Levophed dose range adjusted to 0-1.0 mcg/kg/min.     Intervention Category Intermediate Interventions: Hypotension - evaluation and management  Marveen Reeks [name] 06-Jul-2019, 2:54 AM

## 2019-07-30 NOTE — Progress Notes (Signed)
ANTICOAGULATION CONSULT NOTE - Follow Up Consult  Pharmacy Consult for Heparin Indication: chest pain/ACS  Allergies  Allergen Reactions  . Adrenocorticotrophic Hormone [Corticotropin] Anaphylaxis and Swelling  . Amoxicillin-Pot Clavulanate Diarrhea, Itching and Rash    Pt refuses causes diarrhea realizes not allergy   . Comtrex Cold & Cough Max St  [Dm-Phenylephrine-Acetaminophen] Anaphylaxis and Other (See Comments)    Puts in a coma state (sleep) Pt passed out   . Epinephrine (Anaphylaxis) Hives and Other (See Comments)    syncope  . Alcohol Other (See Comments)    sneeze sneezing  . Ace Inhibitors     unknown  . Chlorphenesin Other (See Comments) and Hives    Comtrex; "passes out"  . Cold Medicine Plus [Chlorphen-Pseudoephed-Apap] Hives and Other (See Comments)    Comtrex; "passes out"  . Allopurinol Rash    HIVES  . Aloe Vera Rash  . Metformin And Related Diarrhea    Patient Measurements: Weight: 193 lb 2 oz (87.6 kg) Heparin Dosing Weight: 87 kg  Vital Signs: Temp: 97 F (36.1 C) (01/04 0000) Temp Source: Oral (01/04 0000) BP: 112/44 (01/04 0000) Pulse Rate: 68 (01/04 0000)  Labs: Recent Labs    06/30/2019 0230 07/07/2019 0444 07/17/2019 0508 07/04/2019 0842 07/08/2019 1250 07/10/2019 1817 07/19/2019 2244  HGB 11.7* 11.9* 11.9* 10.9*  --   --   --   HCT 35.4* 35.0* 36.6 32.0*  --   --   --   PLT 124*  --  124*  --   --   --   --   APTT 39*  --   --   --  >200*  --   --   LABPROT 17.7*  --   --   --  18.5*  --   --   INR 1.5*  --   --   --  1.6*  --   --   HEPARINUNFRC  --   --   --   --  0.76*  --  0.40  CREATININE 2.86*  --  2.79*  --  3.20*  --   --   TROPONINIHS 7,026*  --  5,504*  --  3,675* 2,533*  --     Estimated Creatinine Clearance: 15.1 mL/min (A) (by C-G formula based on SCr of 3.2 mg/dL (H)).   Medical History: Past Medical History:  Diagnosis Date  . Acute on chronic respiratory failure with hypoxia ([place])   . Acute renal failure due to  tubular necrosis ([place])   . Arthritis   . Bilateral pulmonary contusion   . Colitis   . Coronary artery disease   . Diabetes mellitus without complication ([place])   . Diverticula of colon   . Hemothorax, traumatic, subsequent encounter   . Hypertension   . Neuropathy     Medications:  Medications Prior to Admission  Medication Sig Dispense Refill Last Dose  . acetaminophen (TYLENOL) 160 MG/5ML solution Place 20.3 mLs (650 mg total) into feeding tube every 6 (six) hours as needed for mild pain or fever. (Patient taking differently: Take 650 mg by mouth every 6 (six) hours as needed for mild pain or fever. ) 120 mL 0 06/29/2019 at Unknown time  . Amino Acids-Protein Hydrolys (FEEDING SUPPLEMENT, PRO-STAT SUGAR FREE 64,) LIQD Place 30 mLs into feeding tube 3 (three) times daily. 887 mL 0 06/30/2019 at Unknown time  . amitriptyline (ELAVIL) 25 MG tablet Take 75 mg by mouth at bedtime.   06/29/2019  . famotidine (PEPCID) 20 MG  tablet Take 20 mg by mouth 2 (two) times daily.   06/29/2019  . gabapentin (NEURONTIN) 300 MG capsule Take 300 mg by mouth at bedtime.    06/30/2019  . glipiZIDE (GLUCOTROL) 5 MG tablet Take 2.5 mg by mouth daily before breakfast.    06/30/2019  . antiseptic oral rinse (BIOTENE) LIQD 15 mLs by Mouth Rinse route as needed for dry mouth. (Patient not taking: Reported on 07/06/2019)   Not Taking at Unknown time  . bethanechol (URECHOLINE) 25 MG tablet Place 1 tablet (25 mg total) into feeding tube 3 (three) times daily. (Patient not taking: Reported on 07/17/2019)   Not Taking at Unknown time  . Chlorhexidine Gluconate Cloth 2 % PADS Apply 6 each topically daily at 6 (six) AM. (Patient not taking: Reported on 07/20/2019)   Not Taking at Unknown time  . chlorhexidine gluconate, MEDLINE KIT, (PERIDEX) 0.12 % solution 15 mLs by Mouth Rinse route 2 (two) times daily. (Patient not taking: Reported on 07/03/2019) 120 mL 0 Not Taking at Unknown time  . enoxaparin (LOVENOX) 40 MG/0.4ML injection Inject 0.4  mLs (40 mg total) into the skin daily. (Patient not taking: Reported on 07/17/2019) 0 mL  Not Taking at Unknown time  . fentaNYL (SUBLIMAZE) 100 MCG/2ML injection Inject 0.5-1 mLs (25-50 mcg total) into the vein every hour as needed for severe pain. (Patient not taking: Reported on 07/22/2019) 2 mL 0 Not Taking at Unknown time  . furosemide (LASIX) 10 MG/ML injection Inject 4 mLs (40 mg total) into the vein 2 (two) times daily. (Patient not taking: Reported on 07/06/2019) 4 mL 0 Not Taking at Unknown time  . guaiFENesin (ROBITUSSIN) 100 MG/5ML SOLN Place 15 mLs (300 mg total) into feeding tube every 6 (six) hours. (Patient not taking: Reported on 07/09/2019) 236 mL 0 Not Taking at Unknown time  . hydrALAZINE (APRESOLINE) 20 MG/ML injection Inject 0.5 mLs (10 mg total) into the vein every 2 (two) hours as needed (SBP > 180). (Patient not taking: Reported on 07/08/2019) 1 mL  Not Taking at Unknown time  . insulin aspart (NOVOLOG) 100 UNIT/ML injection Inject 0-20 Units into the skin every 4 (four) hours. (Patient not taking: Reported on 07/08/2019) 10 mL 11 Not Taking at Unknown time  . insulin detemir (LEVEMIR) 100 UNIT/ML injection Inject 0.2 mLs (20 Units total) into the skin at bedtime. (Patient not taking: Reported on 07/19/2019) 10 mL 11 Not Taking at Unknown time  . ipratropium-albuterol (DUONEB) 0.5-2.5 (3) MG/3ML SOLN Take 3 mLs by nebulization every 6 (six) hours as needed. (Patient not taking: Reported on 07/29/2019) 360 mL  Not Taking at Unknown time  . LORazepam (ATIVAN) 2 MG/ML injection Inject 0.5 mLs (1 mg total) into the vein every 4 (four) hours as needed for anxiety. (Patient not taking: Reported on 07/27/2019) 1 mL 0 Not Taking at Unknown time  . methocarbamol 1,000 mg in dextrose 5 % 50 mL Inject 1,000 mg into the vein every 8 (eight) hours as needed. (Patient not taking: Reported on 07/03/2019)   Not Taking at Unknown time  . morphine 4 MG/ML injection Inject 1 mL (4 mg total) into the vein every hour as  needed for severe pain (breakthrough pain). (Patient not taking: Reported on 07/10/2019)  0 Not Taking at Unknown time  . mouth rinse LIQD solution 15 mLs by Mouth Rinse route 2 (two) times daily. (Patient not taking: Reported on 07/25/2019)  0 Not Taking at Unknown time  . Nutritional Supplements (FEEDING SUPPLEMENT, PIVOT  1.5 CAL,) LIQD Place 1,000 mLs into feeding tube daily. (Patient not taking: Reported on 07/28/2019)  0 Not Taking at Unknown time  . ondansetron (ZOFRAN) 4 MG/2ML SOLN injection Inject 2 mLs (4 mg total) into the vein every 6 (six) hours as needed for nausea. (Patient not taking: Reported on 07/04/2019) 2 mL 0 Not Taking at Unknown time  . oxyCODONE (ROXICODONE) 5 MG/5ML solution Place 5-10 mLs (5-10 mg total) into feeding tube every 4 (four) hours as needed for moderate pain. (Patient not taking: Reported on 07/24/2019)  0 Not Taking at Unknown time  . pantoprazole sodium (PROTONIX) 40 mg/20 mL PACK Place 20 mLs (40 mg total) into feeding tube daily. (Patient not taking: Reported on 07/25/2019) 30 mL  Not Taking at Unknown time  . potassium chloride 10 MEQ/100ML Inject 100 mLs (10 mEq total) into the vein every 1 hour x 6 doses. (Patient not taking: Reported on 06/30/2019)   Not Taking at Unknown time  . psyllium (HYDROCIL/METAMUCIL) 95 % PACK Place 1 packet into feeding tube 2 (two) times daily. (Patient not taking: Reported on 07/14/2019) 240 each  Not Taking at Unknown time  . QUEtiapine (SEROQUEL) 25 MG tablet Place 1 tablet (25 mg total) into feeding tube 2 (two) times daily. (Patient not taking: Reported on 07/10/2019)   Not Taking at Unknown time  . sodium chloride 0.9 % infusion Inject 10 mLs into the vein as needed (for administration of IV medications (carrier fluid)). (Patient not taking: Reported on 07/05/2019)  0 Not Taking at Unknown time  . sodium chloride flush (NS) 0.9 % SOLN 10-40 mLs by Intracatheter route every 12 (twelve) hours. (Patient not taking: Reported on 07/20/2019)   Not Taking  at Unknown time  . sodium chloride flush (NS) 0.9 % SOLN 10-40 mLs by Intracatheter route as needed (flush). (Patient not taking: Reported on 07/13/2019)   Not Taking at Unknown time  . Water For Irrigation, Sterile (FREE WATER) SOLN Place 250 mLs into feeding tube every 8 (eight) hours. (Patient not taking: Reported on 06/30/2019)   Not Taking at Unknown time    Assessment: 85 y.o. female with h/o CAD transferred from [facility] s/p cardiac arrest, elevated cardiac markers and possible ACS, for heparin.  Hypothermia protocol initiated with goal temp 36C  Initial heparin level is elevated at 0.76 and aPTT >200.   1/4 AM update:  Heparin level therapeutic x 1 after rate decrease   Goal of Therapy:  Heparin level 0.3-0.7 units/ml Monitor platelets by anticoagulation protocol: Yes   Plan:  Cont heparin at 700 units/hr Confirmatory heparin level with AM labs  Narda Bonds, PharmD, [place] Pharmacist Phone: [phone]

## 2019-07-30 NOTE — Progress Notes (Addendum)
Reason for consult: Myoclonus after cardiac arrest  Subjective: No clinical seizures/myoclonus this am. On multiple pressors. Plan to discuss comfort care with family. Per RN, family enroute  ROS: negative except above  Examination  Vital signs in last 24 hours: Temp:  [96.6 F (35.9 C)-98.6 F (37 C)] 98.6 F (37 C) (01/04 0700) Pulse Rate:  [29-120] 80 (01/04 1000) Resp:  [0-24] 0 (01/04 1000) BP: (89-139)/(36-107) 101/39 (01/04 1000) SpO2:  [92 %-100 %] 95 % (01/04 1000) FiO2 (%):  [40 %] 40 % (01/04 0744)  General: lying in bed, NAD CVS: pulse-normal rate and rhythm RS: breathing comfortably, intubated Extremities: edematous   Neuro: MS: comatose, doesn't open eyes to noxious stimuli CN: sluggishly reacting to light, corneal reflex intact, oculocephalics absent, gag reflex absent Motor: doesn't withdraw to noxious stimuli  Basic Metabolic Panel: Recent Labs  Lab 07/05/2019 0230 07/17/2019 0508 07/28/2019 0842 07/21/2019 1250 07/05/19 0107 07/05/19 0245  NA 133* 135 136 135 134* 134*  K 4.3 4.6 4.3 5.0 4.8 4.6  CL 108 109  --  105  --  99  CO2 13* 11*  --  15*  --  15*  GLUCOSE 245* 233*  --  233*  --  220*  BUN 78* 77*  --  73*  --  73*  CREATININE 2.86* 2.79*  --  3.20*  --  3.43*  CALCIUM 8.6* 8.8*  --  8.0*  --  7.6*  MG 1.7  --   --   --   --   --   PHOS 5.0*  --   --   --   --   --     CBC: Recent Labs  Lab 07/06/2019 0230 07/21/2019 0444 07/12/2019 0508 07/13/2019 0842 07/05/2019 0107 07-05-2019 0236  WBC 12.0*  --  15.6*  --   --  11.5*  NEUTROABS 10.8*  --   --   --   --   --   HGB 11.7* 11.9* 11.9* 10.9* 9.9* 10.2*  HCT 35.4* 35.0* 36.6 32.0* 29.0* 30.3*  MCV 86.3  --  87.6  --   --  85.8  PLT 124*  --  124*  --   --  103*     Coagulation Studies: Recent Labs    06/29/2019 0230 07/05/2019 1250  LABPROT 17.7* 18.5*  INR 1.5* 1.6*    Imaging CXR 07/19/2019:  1. Tip of the endotracheal tube approximately 5.4 cm above the carina. 2. Enteric tube in  place with tip in the proximal stomach, side hole above the GE junction. 3. Stable right basilar chest tube. No visible pneumothorax. 4. Dense opacity at the left lung base, which could reflect atelectasis or infiltrate, stable.   ASSESSMENT AND PLAN:  85 year old female s/p 30 minute cardiac arrest. Persistent myoclonus noted by ICU team.   Cardiac arrest  Status myoclonus, resolved - CT head at OSH was reportedly negative for acute abnormality.   Recommendations: - Will dc LTM as no further seizures - Treat clinical seizures/myoclonus with prn IV versed 5mg   - Switched VPA to keppra due to thrombocytopenia and hyperammoniemia. Keppra 500mg  BID renally dosed - Goals of care discussion - Overall presentation is most consistent with severe diffuse anoxic brain injury. Myoclonus occurring within a short time frame following cardiac arrest typically carries a dismal prognosis.    CRITICAL CARE Performed by:   Total critical care time:53minutes  Critical care time was exclusive of separately billable procedures and treating  other patients.  Critical care was necessary to treat or prevent imminent or life-threatening deterioration.  Critical care was time spent personally by me on the following activities: development of treatment plan with patient and/or surrogate as well as nursing, discussions with consultants, evaluation of patient's response to treatment, examination of patient, obtaining history from patient or surrogate, ordering and performing treatments and interventions, ordering and review of laboratory studies, ordering and review of radiographic studies, pulse oximetry and re-evaluation of patient's condition.

## 2019-07-30 NOTE — Progress Notes (Signed)
Seen for post-cardiac arrest care.  S: Patient continues to deteriorate, unable to maintain blood pressures.  Obtunded on ventilator.   O: Blood pressure (!) 103/49, pulse (!) 41, temperature 98.6 F (37 C), resp. rate (!) 0, weight 87.6 kg, SpO2 97 %.  GCS3 Heart sounds irregular Ext mottled with lukewarm ext Lungs clear No edema  A:  # Cardiac arrest with prolonged downtime on TTM now hemodynamically unstable approaching end of life despite maximal aggressive care   P:  Called family, they are coming in Continue bicarb/amio/pressors in interim Death expected with hours Family agrees to allow Heather Lyons to pass in peace if she deteriorates further  The patient is critically ill with multiple organ systems failure and requires high complexity decision making for assessment and support, frequent evaluation and titration of therapies, application of advanced monitoring technologies and extensive interpretation of multiple databases. Critical Care Time devoted to patient care services described in this note independent of APP/resident  time is 40 minutes.   07/21/2019 Myrla Halsted MD

## 2019-07-30 NOTE — Progress Notes (Addendum)
Orders to withdraw care implemented per MD. Family notified and at bedside. Chaplain visited.   Pt. Terminally extubated per orders at 1116 and medications d/c.   Patient passed at 1143 with family at bedside. MD notified.     CDS notified: [phone] Erasmo Score

## 2019-07-30 NOTE — Progress Notes (Signed)
Heather Lyons called due to eminent withdrawal of support.  Call with time of death

## 2019-07-30 NOTE — Discharge Summary (Signed)
Date of Admission: 07/03/2019 Date of Death: 07-07-19 Diagnoses: Cardiac arrest Hospital Course: 85 yr old F w/ PMHx of CABG, AVR, DM, colostomy presented to [facility] on 06/30/2019 after collapsing at her home.  She had a prolonged CPR course before ROSC.  She was started on targeted temperature management but unfortunately continued to deteriorate into multiorgan failure.  She passed away with family at bedside.  Myrla Halsted MD PCCM

## 2019-07-30 NOTE — Procedures (Signed)
Extubation Procedure Note  Patient Details:   Name: Heather Lyons DOB: 1934/10/10 MRN: [id number]   Airway Documentation:    Vent end date: 07/08/2019 Vent end time: 1116   Evaluation  Patient extubated per MD order. Family and RN at bedside.   [name] Zackery Brine 07/28/2019, 11:17 AM

## 2019-07-30 NOTE — Progress Notes (Signed)
Checked impedances, all <5 Ohms. FP1, F8 checked, no skin break down. Continue to monitor.

## 2019-07-30 NOTE — Progress Notes (Signed)
MD Stretch asked RN to update family about patient declining. RN called daughter and gave her an update.

## 2019-07-30 NOTE — Procedures (Addendum)
Patient Name: Heather Lyons  MRN: [id number]  Epilepsy Attending: Charlsie Quest  Referring Physician/Provider: Dr. Newell Coral Duration: 07-23-19 6213 to 21/09/2019 0909  Patient history: 85 year old female status post 30-minute cardiac arrest followed by myoclonic activity.  EEG to evaluate for seizures.  Level of alertness: Comatose  AEDs during EEG study: Versed, valproic acid  Technical aspects: This EEG study was done with scalp electrodes positioned according to the 10-20 International system of electrode placement. Electrical activity was acquired at a sampling rate of 500Hz  and reviewed with a high frequency filter of 70Hz  and a low frequency filter of 1Hz . EEG data were recorded continuously and digitally stored.   Description: At the beginning of EEG, burst suppression pattern was seen with 5 to 6 seconds of generalized suppression and 1 to 2 seconds of generalized epileptiform burst.  At the beginning, the epileptiform bursts accompanied clinically by axial myoclonic activity.  Patient was started on Versed after which the duration of suppression increased to 15 to 20 seconds with intermittent 1 second generalized burst.  After around 2100 on Jul 23, 2019, generalized EEG suppression was noted.  EEG was not reactive to tactile stimuli.  Hyperventilation and photic stimulation were not performed.  Abnormality -Status myoclonus, generalized -Burst suppression, generalized -EEG suppression, generalized  IMPRESSION: This study initially showed evidence of status myoclonus.  After initiation of Versed, there was evidence of profound diffuse encephalopathy, nonspecific to etiology but likely secondary to diffuse anoxic/hypoxic brain injury, sedation.  Marquez Ceesay 

## 2019-07-30 NOTE — Progress Notes (Signed)
eLink Physician-Brief Progress Note Patient Name: Heather Lyons DOB: 1935-05-04 MRN: [id number]   Date of Service  July 23, 2019  HPI/Events of Note  Worsening hypotension. Not meeting MAP target despite levophed at 1.0 mcg/kg/min and vasopressin drip. Poor global neurological prognosis portended by prolonged downtime during VT arrest, with subsequent myoclonic activity seen in-hospital.  eICU Interventions  RN is going to call family and see whether they would like to transition to comfort measures at this time due to her declining clinical status.     Intervention Category Minor Interventions: Communication with other healthcare providers and/or family  Janae Bridgeman 23-Jul-2019, 4:06 AM

## 2019-07-30 NOTE — Progress Notes (Signed)
Chaplain engaged in initial visit with Dalores's family.  Chaplain offered support and provided refreshments to loved one and assisted in them obtaining a lock of hair from [place].  Chaplain will continue to follow-up as needed.

## 2019-07-30 NOTE — Procedures (Signed)
Patient Name: Heather Lyons  MRN: [id number]  Epilepsy Attending: Charlsie Quest  Referring Physician/Provider: Dr. Newell Coral Duration: 07-10-19 0914 to July 10, 2019 1130  Patient history: 85 year old female status post 30-minute cardiac arrest followed by myoclonic activity.  EEG to evaluate for seizures.  Level of alertness: Comatose  AEDs during EEG study: Versed, valproic acid  Technical aspects: This EEG study was done with scalp electrodes positioned according to the 10-20 International system of electrode placement. Electrical activity was acquired at a sampling rate of 500Hz  and reviewed with a high frequency filter of 70Hz  and a low frequency filter of 1Hz . EEG data were recorded continuously and digitally stored.   Description:  EEG showed generalized suppression, not reactive to tactile stimuli.  Hyperventilation and photic stimulation were not performed.  Abnormality -EEG suppression, generalized  IMPRESSION: This study showed evidence of profound diffuse encephalopathy, nonspecific to etiology but likely secondary to diffuse anoxic/hypoxic brain injury, sedation.  [name] 

## 2019-07-30 NOTE — Progress Notes (Signed)
vLTM EEG complete. No skin breakdown 
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
## 2019-07-30 DEATH — deceased
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
## 2020-11-18 IMAGING — DX PORTABLE CHEST - 1 VIEW
1 series · 1 of 1 positions shown · non-contrast
Comparison: CT chest dated January 01, 2019

CLINICAL DATA: Status post chest tube placement.

EXAM:
PORTABLE CHEST 1 VIEW

[chest]
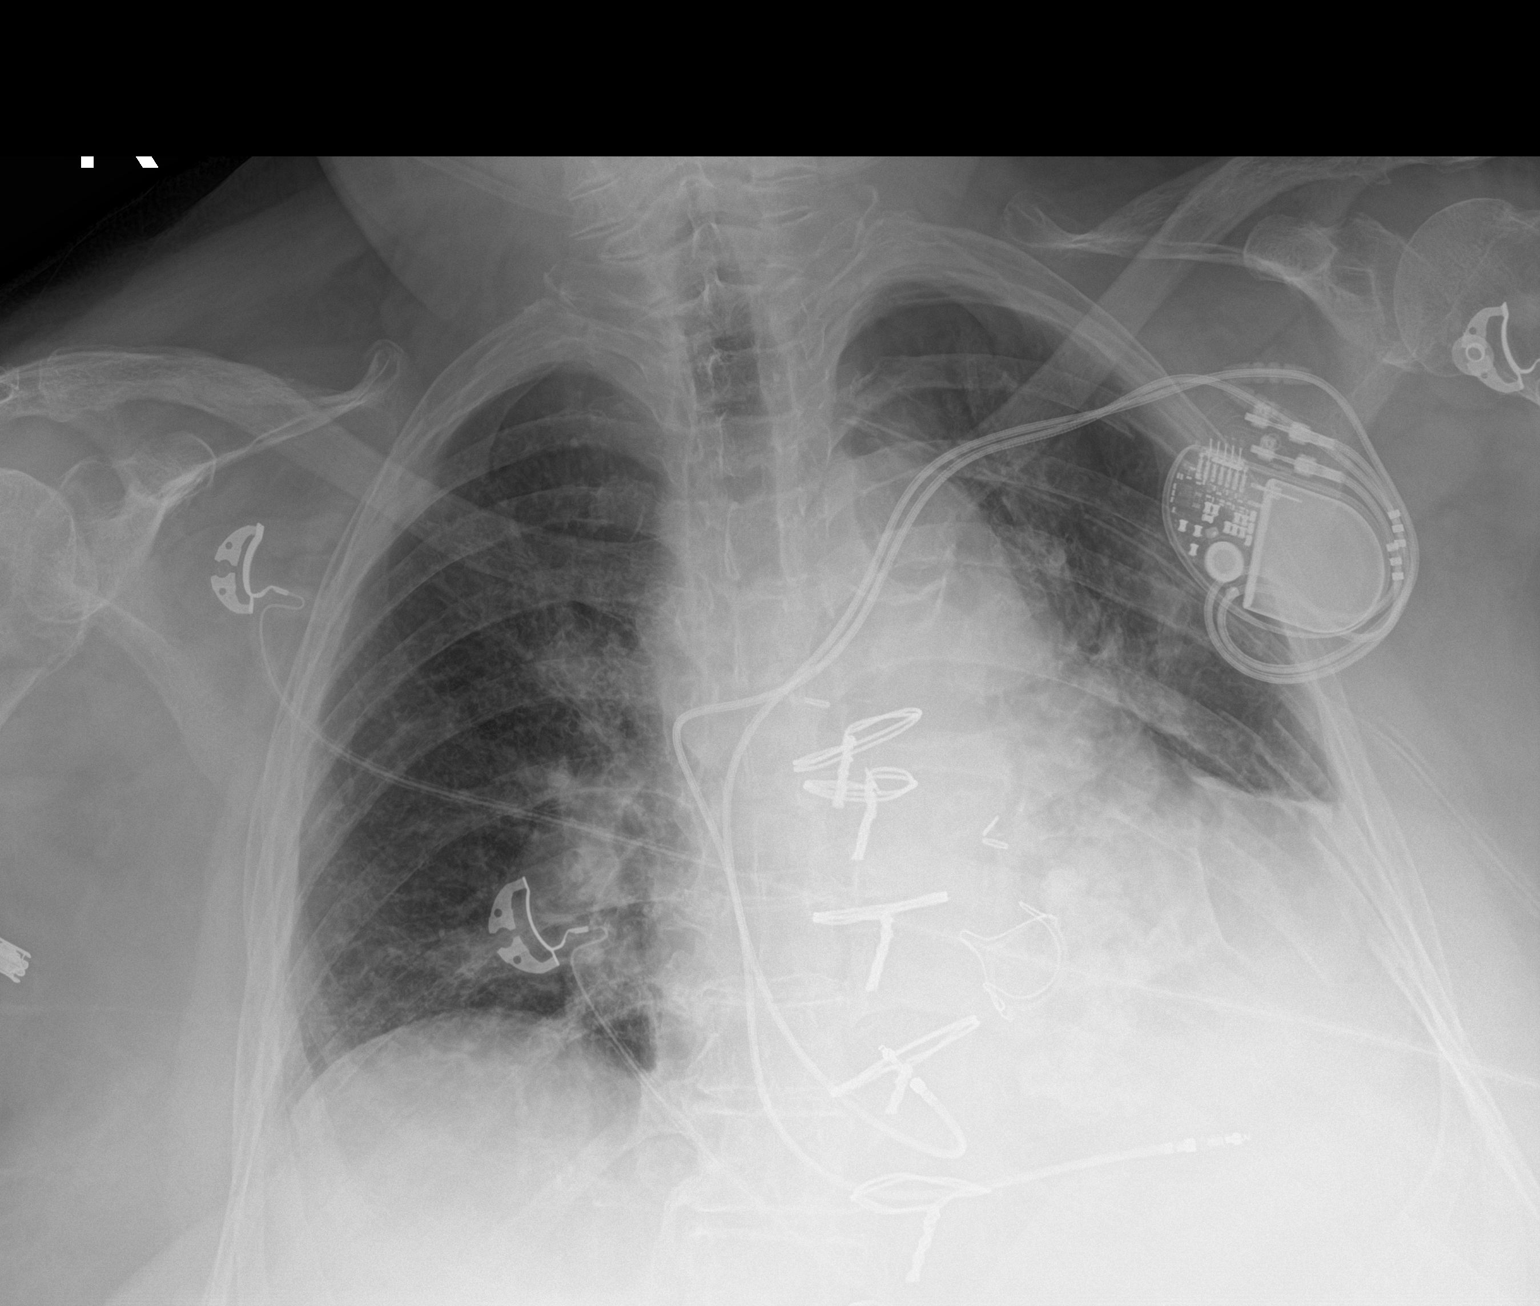

[1 of 1 positions shown; findings below may reference images not displayed]

FINDINGS: There is a new left-sided chest tube in place. No evidence of a
left-sided pneumothorax. Multiple displaced left-sided rib fractures
are noted. There is a persistent airspace opacity at the left lung
base with a persistent left-sided pleural effusion. No right-sided
pneumothorax. The heart size remains enlarged.
IMPRESSION: 1. Status post placement of a left-sided chest tube without evidence
of a left-sided pneumothorax.
2. Again noted are displaced left-sided rib fractures.
3. Persistent left-sided hemothorax with adjacent atelectasis or
pulmonary contusions.

## 2020-11-19 IMAGING — CT CT ABDOMEN AND PELVIS WITHOUT CONTRAST
2 of 4 series · 16 of 46 positions shown, 18 images · non-contrast
Comparison: CT scan dated 01/01/2019

CLINICAL DATA: New abdominal pain.

EXAM:
CT ABDOMEN AND PELVIS WITHOUT CONTRAST
TECHNIQUE: Multidetector CT imaging of the abdomen and pelvis was performed
following the standard protocol without IV contrast.

[Series 3: a/p w/o 5mm · axial · non-contrast · 0.98mm/px · z∈[+860,+1360]mm · 13 of 110 slices shown, 15 images]
[im 5/110  soft-tissue]
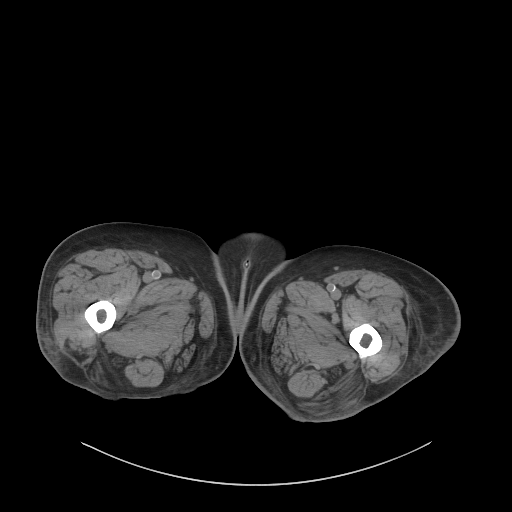
[im 5/110  bone]
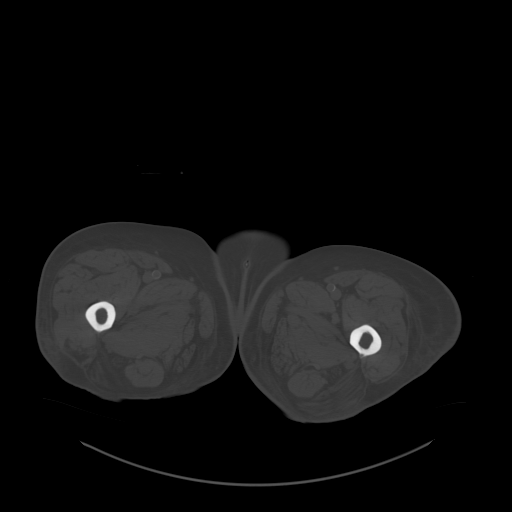
[im 15/110  soft-tissue]
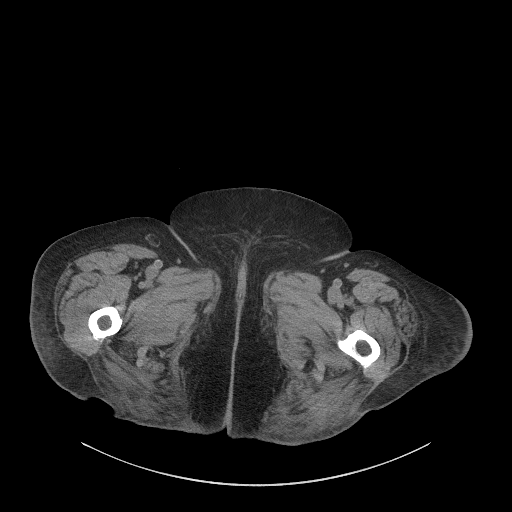
[im 25/110  soft-tissue]
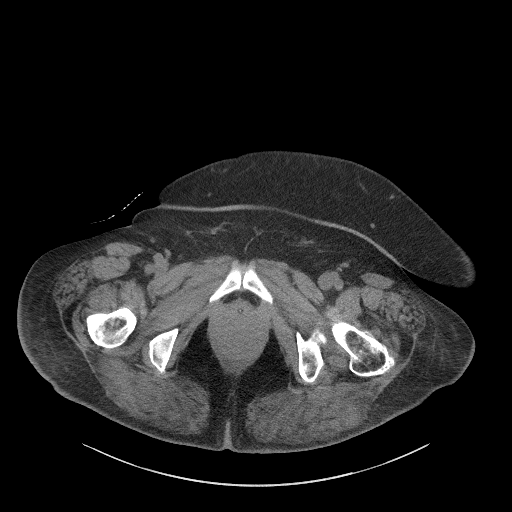
[im 30/110  soft-tissue]
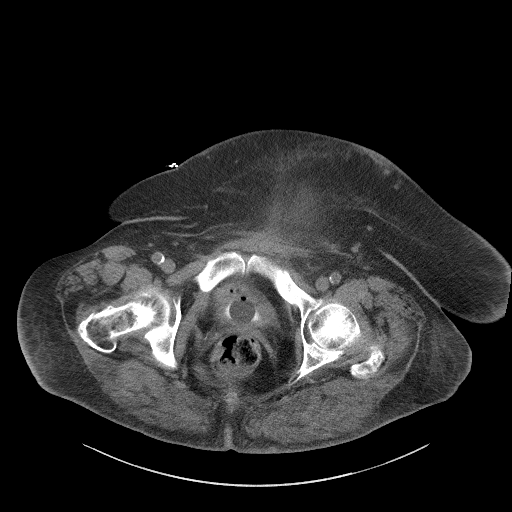
[im 40/110  soft-tissue]
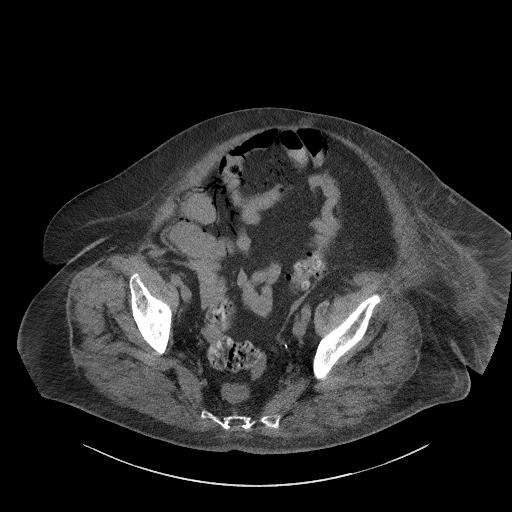
[im 45/110  soft-tissue]
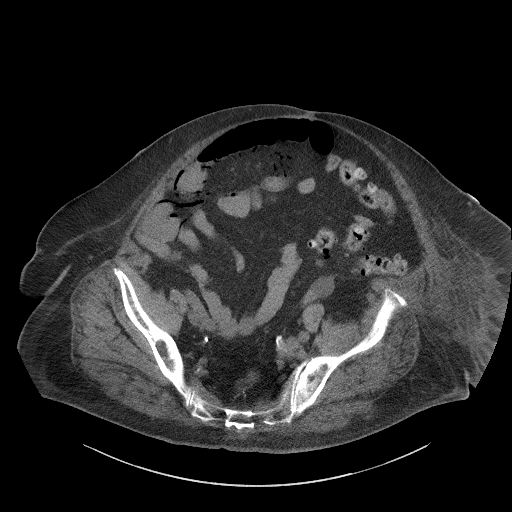
[im 55/110  soft-tissue]
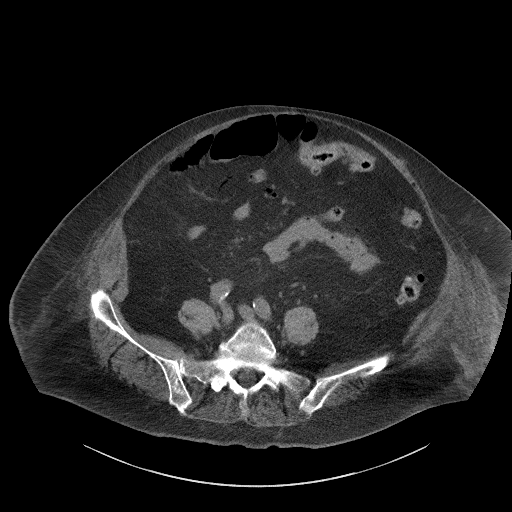
[im 65/110  soft-tissue]
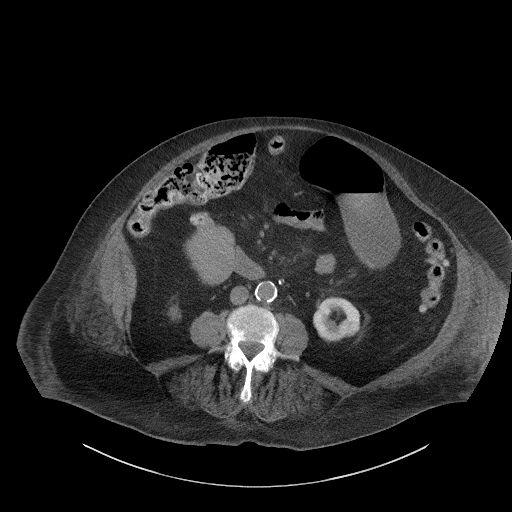
[im 70/110  soft-tissue]
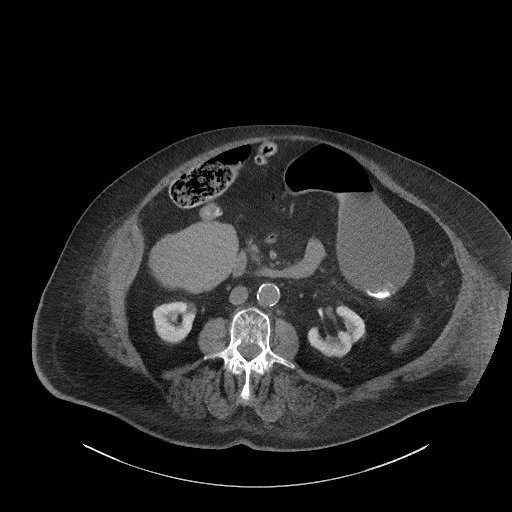
[im 70/110  bone]
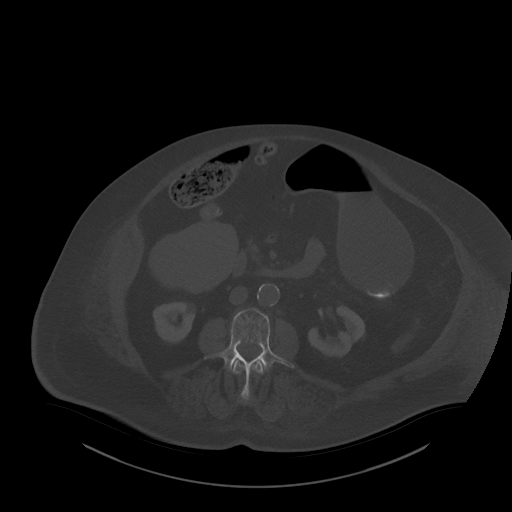
[im 80/110  soft-tissue]
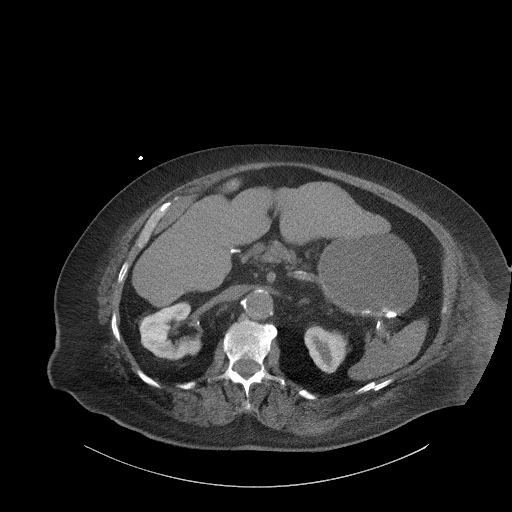
[im 85/110  soft-tissue]
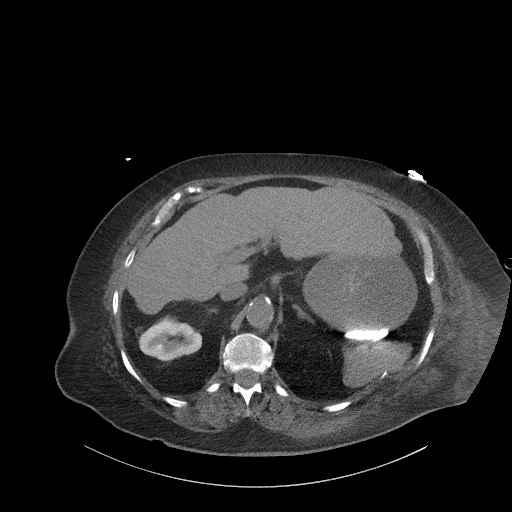
[im 95/110  soft-tissue]
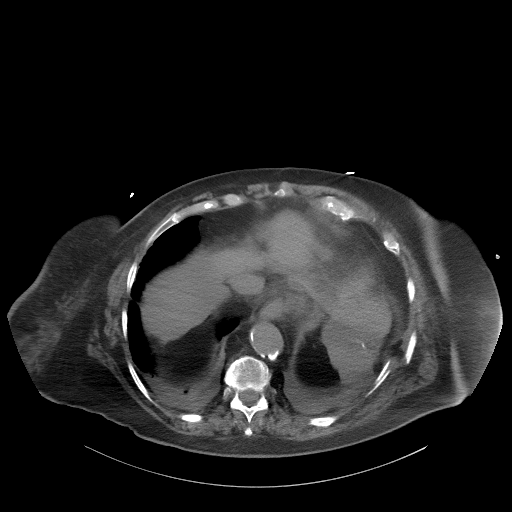
[im 105/110  soft-tissue]
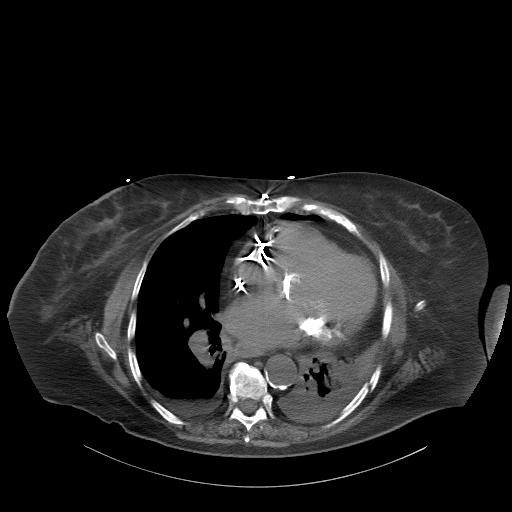

[Series 6: a/p w/o cor · coronal · non-contrast · 1.29mm/px · 3 of 201 slices shown]
[im 67/201  soft-tissue]
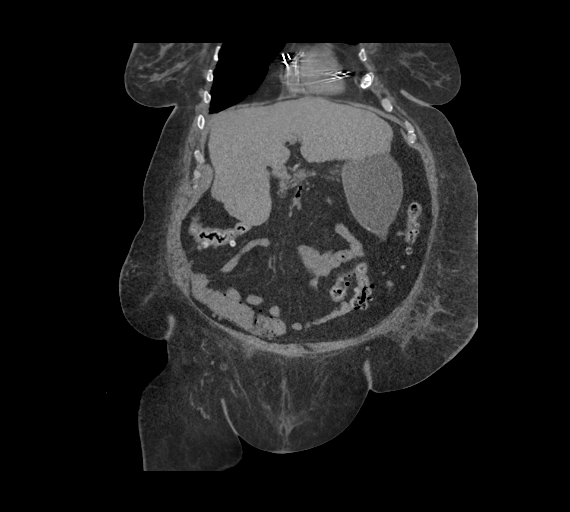
[im 89/201  soft-tissue]
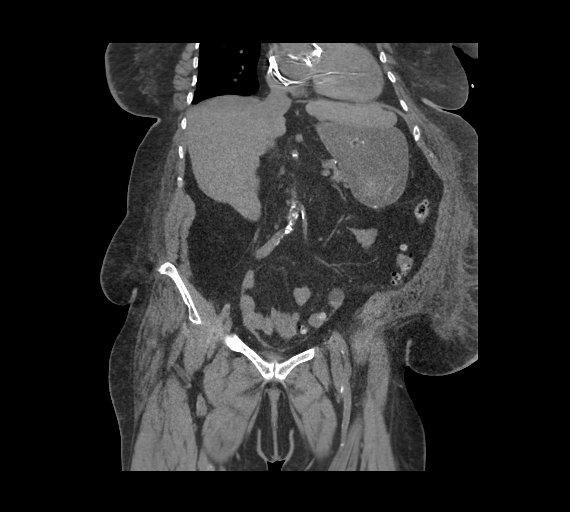
[im 112/201  soft-tissue]
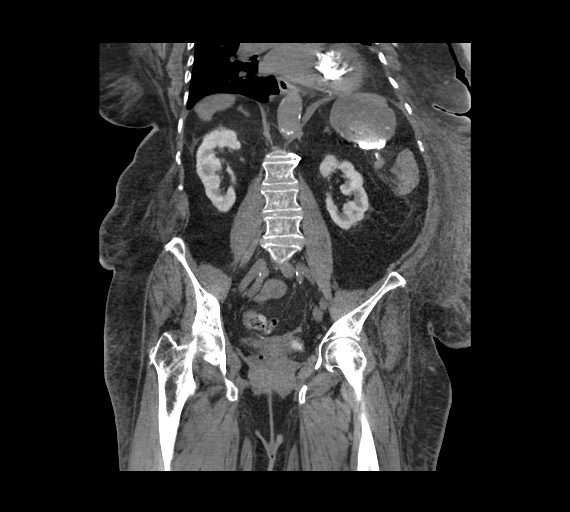

[16 of 46 positions shown; findings below may reference images not displayed]

FINDINGS: Lower chest: There are small bilateral pleural effusions.
Atelectasis at the left lung base. The effusion/hemothorax on the
left has diminished since the prior study. Multiple displaced left
posterolateral rib fractures are again noted. Chronic mild
cardiomegaly. Aortic atherosclerosis.

Hepatobiliary: The patient has small bubbles of air in the dome of
the left lobe of the liver which are new. The liver contour is
nodular with enlargement of the left lobe suggestive of cirrhosis.
Cholecystectomy. No dilated bile ducts.

Pancreas: Unremarkable. No pancreatic ductal dilatation or
surrounding inflammatory changes.

Spleen: No splenic injury or perisplenic hematoma. Normal in size.
No focal abnormality.

Adrenals/Urinary Tract: Adrenal glands are normal. Intense delayed
nephrogram from the CT scan done 2 days ago. This is consistent with
acute renal failure. Small amount of contrast in the nondilated
ureters. Foley catheter in the empty bladder.

Stomach/Bowel: The patient has developed extensive air in the
mesenteric veins of the small bowel in the right and mid abdomen
with air in the superior mesenteric vein. No dilated large or small
bowel. Diverticulosis of the left side of the colon.

Vascular/Lymphatic: Extensive aortic atherosclerosis. No adenopathy.

Reproductive: Status post hysterectomy. No adnexal masses.

Other: No ascites. No abdominal wall hernia. Increased subcutaneous
edema primarily in the left flank and in the left side of the
panniculus.

Musculoskeletal: Multiple displaced left posterolateral rib
fractures as previously described. No other acute bone
abnormalities.
IMPRESSION: 1. New gas in mesenteric veins of the small bowel and in the
submucosa several loops of small bowel. Gas extends into the
superior mesenteric vein and into the liver likely through the
portal vein. This is likely due to ischemic small bowel.
2. Extensive aortic atherosclerosis.
3. Nodular liver consistent with cirrhosis.
4. Bibasilar atelectasis and small effusions.
5. Delayed intense nephrograms consistent with acute renal failure.
6. The findings were discussed in person with Dr. Perlita.

## 2020-11-19 IMAGING — DX PORTABLE CHEST - 1 VIEW
1 series · 1 of 1 positions shown · non-contrast
Comparison: Chest x-ray dated 01/02/2019 and chest CT dated
01/01/2019

CLINICAL DATA: Left hemothorax secondary to rib fractures.

EXAM:
PORTABLE CHEST 1 VIEW

[chest ap]
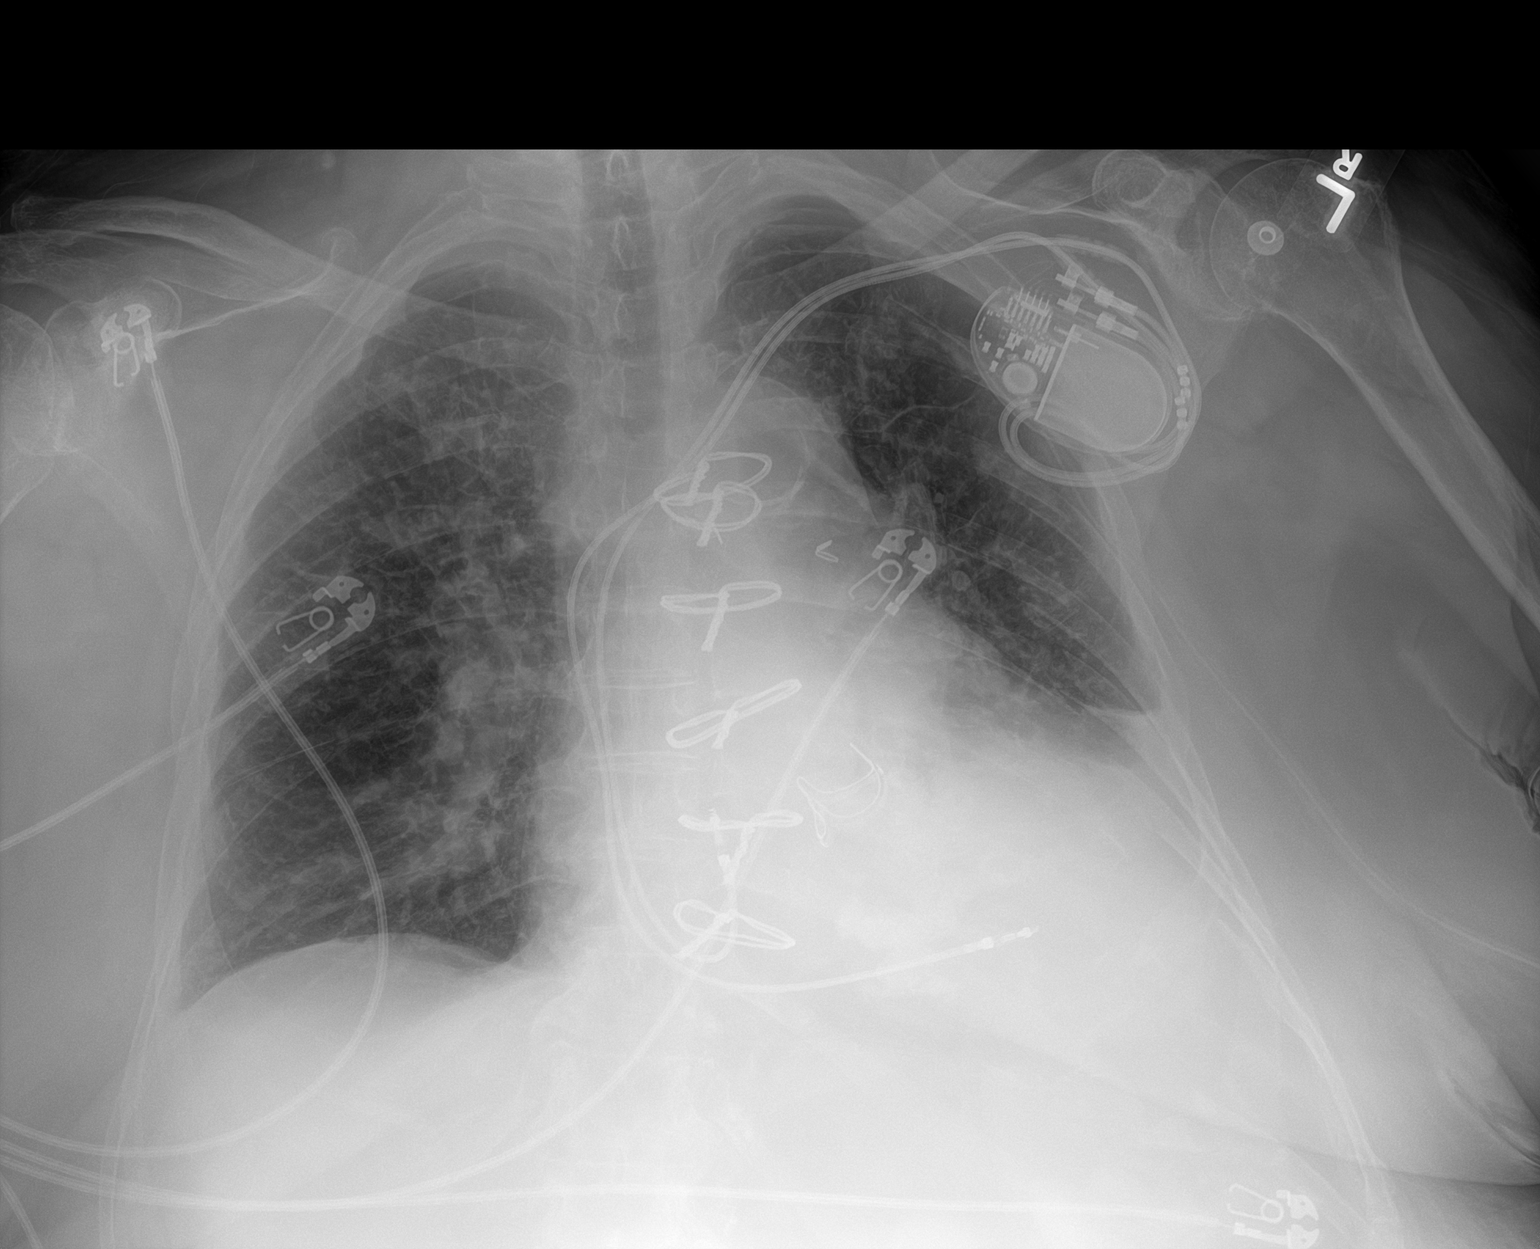

[1 of 1 positions shown; findings below may reference images not displayed]

FINDINGS: There is increased density at the left base as compared to the prior
study, probably a combination of hemothorax and atelectasis
secondary to the multiple displaced left rib fractures.

Overall heart size and pulmonary vascularity are normal. Right lung
is clear.

There is a tiny left apical pneumothorax. Left chest tube in place.

Pacemaker in place.  Prosthetic aortic valve.
IMPRESSION: 1. Tiny left apical pneumothorax.
2. Increased density at the left base consistent with a combination
of hemothorax and atelectasis.

## 2020-11-19 IMAGING — DX PORTABLE ABDOMEN - 1 VIEW
3 series · 3 of 3 positions shown · non-contrast
Comparison: CT abdomen and pelvis 01/01/2019

CLINICAL DATA: Abdominal pain and nausea, feels like needs to
vomit, history diabetes mellitus, coronary artery disease

EXAM:
PORTABLE ABDOMEN - 1 VIEW

[abdomen kub (1 of 3)]
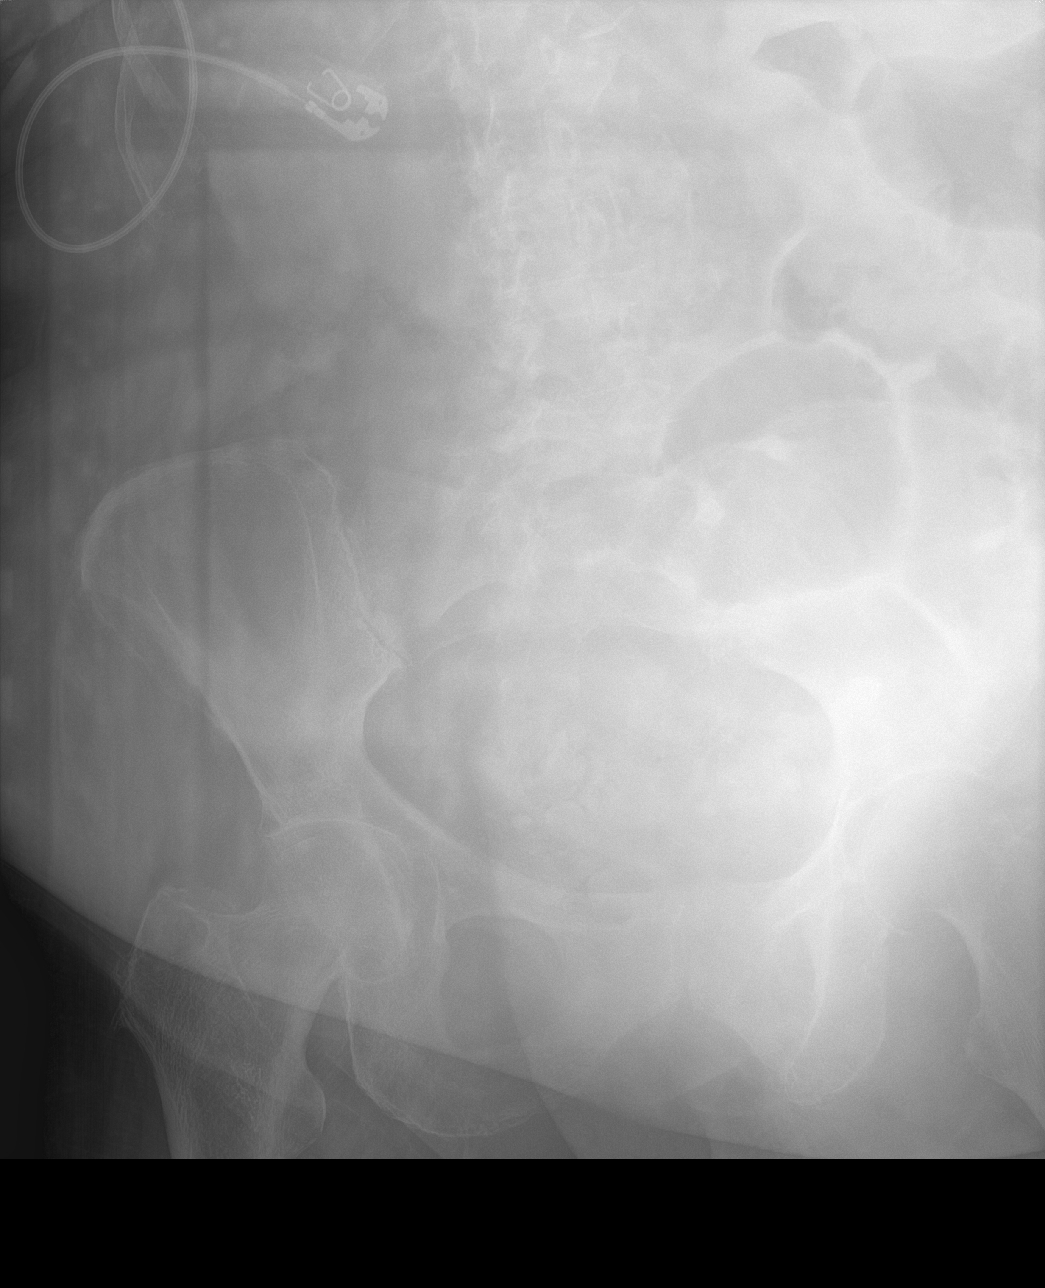

[abdomen kub (2 of 3)]
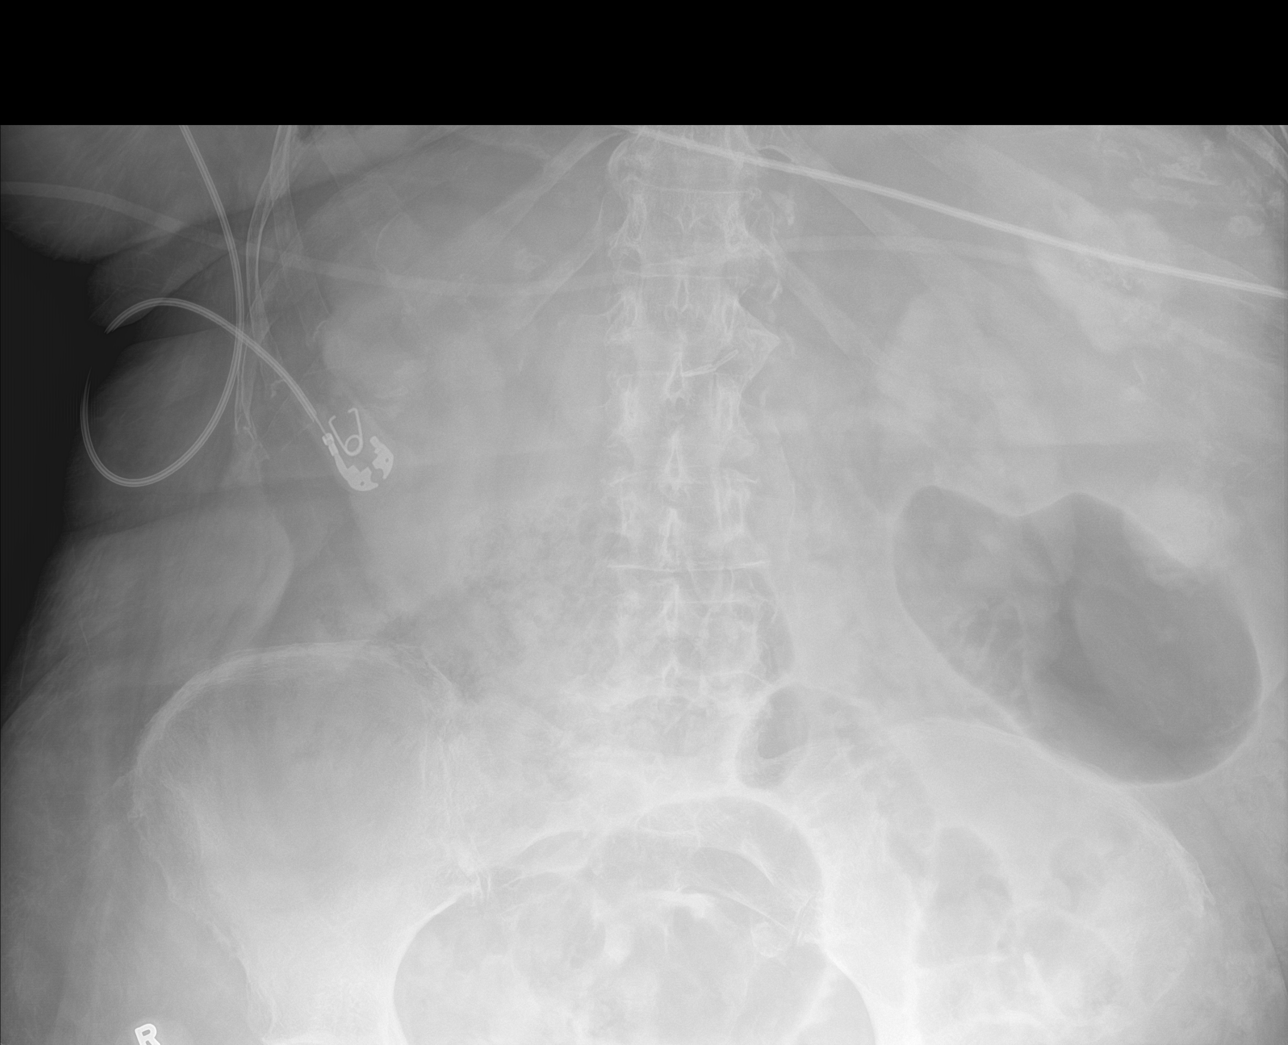

[abdomen kub (3 of 3)]
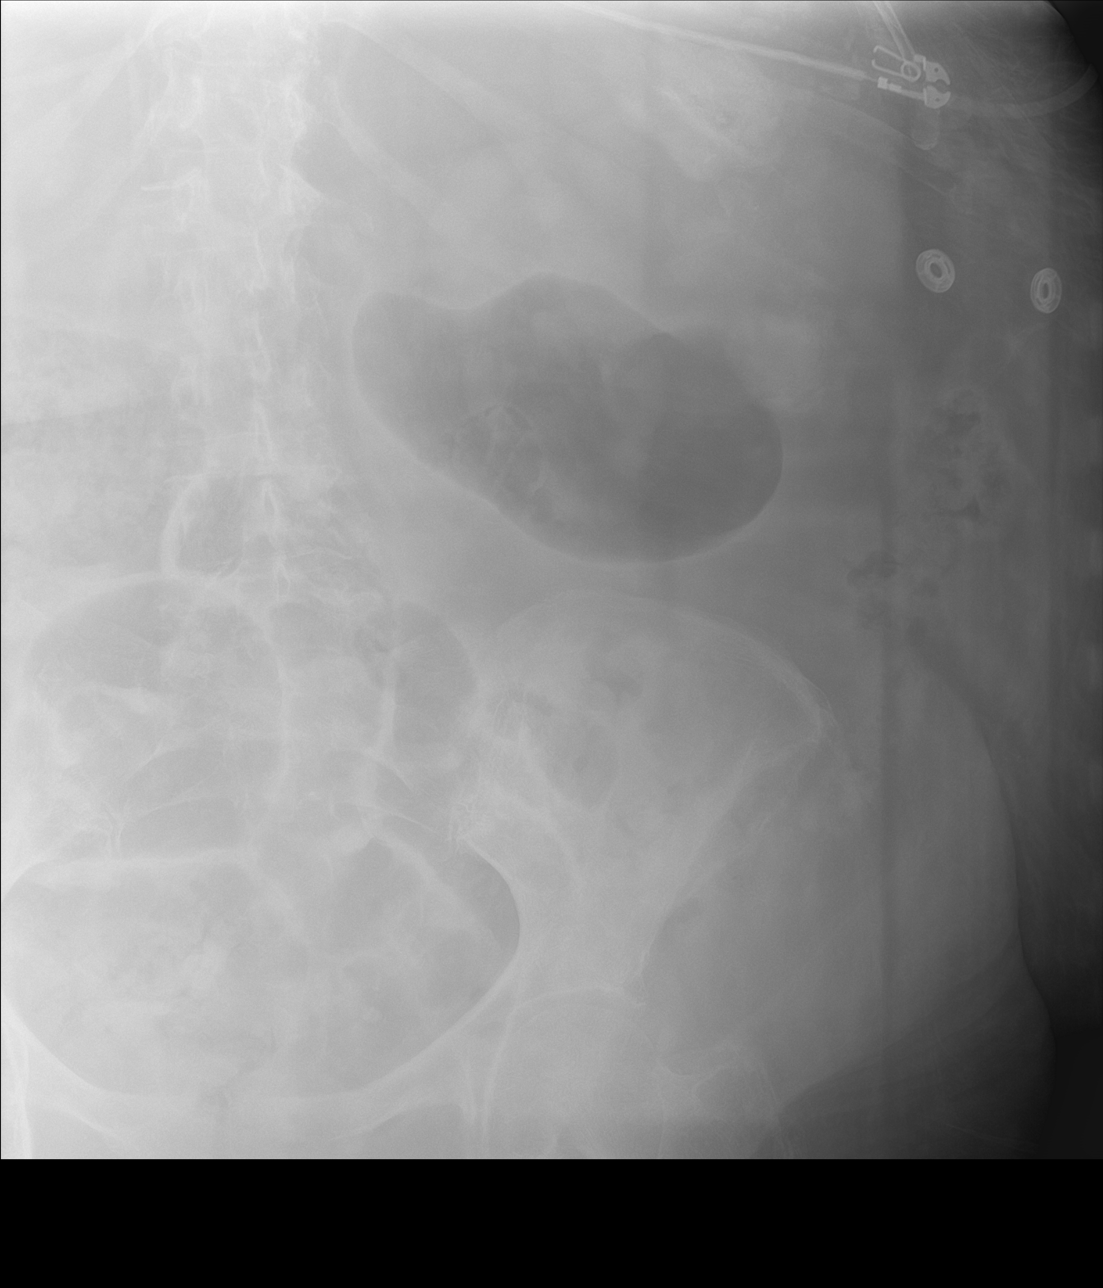

[3 of 3 positions shown; findings below may reference images not displayed]

FINDINGS: Stool in RIGHT colon.

Gaseous collection in the LEFT mid abdomen appears to represent
gastric body by CT.

Few prominent air-filled loops of small bowel in the mid
abdomen/upper pelvis, nonspecific.

No bowel wall thickening or evidence of obstruction.

Opacities in LEFT upper quadrant may represent radiodense medication
within bowel, with no evidence of recent contrast administration.

Bones severely demineralized.
IMPRESSION: Nonobstructive bowel gas pattern.
